# Patient Record
Sex: Female | Born: 1985 | Race: Asian | Hispanic: No | Marital: Married | Smoking: Current every day smoker
Health system: Southern US, Community
[De-identification: ages and names within clinical notes are randomized; demographics above are authoritative.]

## PROBLEM LIST (undated history)

## (undated) ENCOUNTER — Inpatient Hospital Stay (HOSPITAL_COMMUNITY): Payer: BC Managed Care – PPO

## (undated) ENCOUNTER — Inpatient Hospital Stay (HOSPITAL_COMMUNITY): Payer: Self-pay

## (undated) DIAGNOSIS — G43019 Migraine without aura, intractable, without status migrainosus: Principal | ICD-10-CM

## (undated) DIAGNOSIS — A549 Gonococcal infection, unspecified: Secondary | ICD-10-CM

## (undated) DIAGNOSIS — R87619 Unspecified abnormal cytological findings in specimens from cervix uteri: Secondary | ICD-10-CM

## (undated) DIAGNOSIS — R51 Headache: Secondary | ICD-10-CM

## (undated) DIAGNOSIS — B999 Unspecified infectious disease: Secondary | ICD-10-CM

## (undated) DIAGNOSIS — F329 Major depressive disorder, single episode, unspecified: Secondary | ICD-10-CM

## (undated) DIAGNOSIS — T7840XA Allergy, unspecified, initial encounter: Secondary | ICD-10-CM

## (undated) DIAGNOSIS — F419 Anxiety disorder, unspecified: Secondary | ICD-10-CM

## (undated) DIAGNOSIS — R87629 Unspecified abnormal cytological findings in specimens from vagina: Secondary | ICD-10-CM

## (undated) DIAGNOSIS — F32A Depression, unspecified: Secondary | ICD-10-CM

## (undated) HISTORY — DX: Allergy, unspecified, initial encounter: T78.40XA

## (undated) HISTORY — DX: Major depressive disorder, single episode, unspecified: F32.9

## (undated) HISTORY — DX: Unspecified abnormal cytological findings in specimens from cervix uteri: R87.619

## (undated) HISTORY — DX: Migraine without aura, intractable, without status migrainosus: G43.019

## (undated) HISTORY — DX: Anxiety disorder, unspecified: F41.9

## (undated) HISTORY — DX: Depression, unspecified: F32.A

## (undated) HISTORY — PX: NO PAST SURGERIES: SHX2092

---
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
## 2003-11-29 ENCOUNTER — Emergency Department (HOSPITAL_COMMUNITY): Admission: EM | Admit: 2003-11-29 | Discharge: 2003-11-29 | Payer: Self-pay | Admitting: Emergency Medicine

## 2008-02-05 ENCOUNTER — Emergency Department (HOSPITAL_COMMUNITY): Admission: EM | Admit: 2008-02-05 | Discharge: 2008-02-05 | Payer: Self-pay | Admitting: Emergency Medicine

## 2010-01-21 ENCOUNTER — Emergency Department (HOSPITAL_COMMUNITY): Admission: EM | Admit: 2010-01-21 | Discharge: 2010-01-21 | Payer: Self-pay | Admitting: Family Medicine

## 2010-11-30 ENCOUNTER — Inpatient Hospital Stay (INDEPENDENT_AMBULATORY_CARE_PROVIDER_SITE_OTHER)
Admission: RE | Admit: 2010-11-30 | Discharge: 2010-11-30 | Disposition: A | Discharge status: Home / Self Care | Payer: PRIVATE HEALTH INSURANCE | Source: Ambulatory Visit | Attending: Emergency Medicine | Admitting: Emergency Medicine

## 2010-11-30 DIAGNOSIS — J020 Streptococcal pharyngitis: Secondary | ICD-10-CM

## 2010-11-30 LAB — POCT URINALYSIS DIPSTICK
Hgb urine dipstick: NEGATIVE
Specific Gravity, Urine: 1.025 (ref 1.005–1.030)
Urobilinogen, UA: 0.2 mg/dL (ref 0.0–1.0)

## 2010-11-30 LAB — POCT INFECTIOUS MONO SCREEN: Mono Screen: NEGATIVE

## 2010-11-30 LAB — POCT RAPID STREP A (OFFICE): Streptococcus, Group A Screen (Direct): POSITIVE — AB

## 2010-11-30 LAB — POCT PREGNANCY, URINE: Preg Test, Ur: NEGATIVE

## 2010-12-21 LAB — CBC
HCT: 37.8 % (ref 36.0–46.0)
Hemoglobin: 13.1 g/dL (ref 12.0–15.0)
MCHC: 34.6 g/dL (ref 30.0–36.0)
MCV: 89.9 fL (ref 78.0–100.0)
WBC: 8.3 10*3/uL (ref 4.0–10.5)

## 2010-12-21 LAB — D-DIMER, QUANTITATIVE: D-Dimer, Quant: 0.48 ug/mL-FEU (ref 0.00–0.48)

## 2013-03-12 LAB — PREGNANCY, URINE: Preg Test, Ur: POSITIVE

## 2013-03-13 ENCOUNTER — Encounter: Payer: Self-pay | Admitting: General Practice

## 2013-03-26 ENCOUNTER — Inpatient Hospital Stay (HOSPITAL_COMMUNITY): Payer: Medicaid Other

## 2013-03-26 ENCOUNTER — Inpatient Hospital Stay (HOSPITAL_COMMUNITY)
Admission: AD | Admit: 2013-03-26 | Discharge: 2013-03-26 | Disposition: A | Discharge status: Home / Self Care | Payer: Medicaid Other | Source: Ambulatory Visit | Attending: Obstetrics & Gynecology | Admitting: Obstetrics & Gynecology

## 2013-03-26 ENCOUNTER — Encounter (HOSPITAL_COMMUNITY): Payer: Self-pay

## 2013-03-26 DIAGNOSIS — O209 Hemorrhage in early pregnancy, unspecified: Secondary | ICD-10-CM

## 2013-03-26 HISTORY — DX: Unspecified infectious disease: B99.9

## 2013-03-26 HISTORY — DX: Headache: R51

## 2013-03-26 LAB — ABO/RH: ABO/RH(D): B POS

## 2013-03-26 NOTE — MAU Note (Signed)
Bleeding just started prior to coming.  No pain. Has appt at [facility] on 07/02, called them told to come here.

## 2013-03-26 NOTE — MAU Provider Note (Signed)

## 2013-03-26 NOTE — MAU Provider Note (Signed)
History     CSN: [id number]  Arrival date and time: 03/26/13 1259   First Provider Initiated Contact with Patient 03/26/13 1429      Chief Complaint  Patient presents with  . Vaginal Bleeding   HPI [name] is a 27 y.o. G1P0 at [redacted]w[redacted]d who presents to MAU today with complaint of vaginal bleeding. The patient states LMP 12/31/12. +[facility]. The patient started bleeding this afternoon. It was pink with some red and has now turned to brown. It has been very light. She denies pain, discharge or fever. She has had some nausea without vomiting or diarrhea. She had intercourse last night. She denies previous bleeding episodes in pregnancy.   OB History   Grav Para Term Preterm Abortions TAB SAB Ect Mult Living   1               Past Medical History  Diagnosis Date  . Headache(784.0)   . Infection     UTI    Past Surgical History  Procedure Laterality Date  . No past surgeries      Family History  Problem Relation Age of Onset  . Hearing loss Neg Hx     History  Substance Use Topics  . Smoking status: Former Games developer  . Smokeless tobacco: Never Used     Comment: not every day smoker, prior to the preg  . Alcohol Use: No    Allergies: Not on File  No prescriptions prior to admission    Review of Systems  Constitutional: Negative for fever and malaise/fatigue.  Gastrointestinal: Positive for nausea. Negative for vomiting, abdominal pain and diarrhea.  Genitourinary: Negative for dysuria, urgency and frequency.       + Vaginal bleeding Neg - vaginal discharge  Neurological: Negative for dizziness, loss of consciousness and weakness.   Physical Exam   Blood pressure 124/81, pulse 97, temperature 98.8 F (37.1 C), temperature source Oral, resp. rate 16, height 5' (1.524 m), weight 118 lb 9.6 oz (53.797 kg), last menstrual period 12/31/2012, SpO2 100.00%.  Physical Exam  Constitutional: She is oriented to  person, place, and time. She appears well-developed and well-nourished. No distress.  HENT:  Head: Normocephalic and atraumatic.  Cardiovascular: Normal rate, regular rhythm and normal heart sounds.   Respiratory: Effort normal and breath sounds normal. No respiratory distress.  GI: Soft. Bowel sounds are normal. She exhibits no distension and no mass. There is no tenderness. There is no rebound and no guarding.  Neurological: She is alert and oriented to person, place, and time.  Skin: Skin is warm and dry. No erythema.  Psychiatric: She has a normal mood and affect.   Results for orders placed during the hospital encounter of 03/26/13 (from the past 24 hour(s))  WET PREP, GENITAL     Status: Abnormal   Collection Time    03/26/13  2:50 PM      Result Value Range   Yeast Wet Prep HPF POC NONE SEEN  NONE SEEN   Trich, Wet Prep NONE SEEN  NONE SEEN   Clue Cells Wet Prep HPF POC FEW (*) NONE SEEN   WBC, Wet Prep HPF POC FEW (*) NONE SEEN  ABO/RH     Status: None   Collection Time    03/26/13  5:00 PM      Result Value Range   ABO/RH(D) B POS       MAU Course  Procedures  None  MDM UA, Wet prep, GC/Chlamydia and [place] today ABO/Rh and quant hCG drawn Quant pending at time of discharge Discussed [place] results with patient. Defined poor prognostic factors noted on [place] and high possibility of SAB. Patient will return to MAU in 48 hours for repeat quant hCG to confirm. Bleeding precautions discussed.   Assessment and Plan  A: Vaginal bleeding in early pregnancy  P: Discharge home Bleeding precautions discussed Patient to return in 48 hours for repeat quant hCG  Patient may return to MAU sooner if symptoms were to change or worsen  Freddi Starr, PA-C  03/26/2013, 5:42 PM

## 2013-03-26 NOTE — MAU Note (Signed)
Patient states she had dark red blood on tissue about 1200, no pain. Patient has had a positive pregnancy test at [facility] and has an appointment with the [facility] on 7-2. No active bleeding.

## 2013-03-26 NOTE — MAU Note (Signed)
Additional labs drawn and explained.

## 2013-03-28 ENCOUNTER — Inpatient Hospital Stay (HOSPITAL_COMMUNITY)
Admission: AD | Admit: 2013-03-28 | Discharge: 2013-03-28 | Disposition: A | Discharge status: Home / Self Care | Payer: PRIVATE HEALTH INSURANCE | Source: Ambulatory Visit | Attending: Obstetrics & Gynecology | Admitting: Obstetrics & Gynecology

## 2013-03-28 DIAGNOSIS — O034 Incomplete spontaneous abortion without complication: Secondary | ICD-10-CM

## 2013-03-28 LAB — CBC
HCT: 37.9 % (ref 36.0–46.0)
MCH: 29.9 pg (ref 26.0–34.0)
MCHC: 34 g/dL (ref 30.0–36.0)
RBC: 4.32 MIL/uL (ref 3.87–5.11)
RDW: 12.8 % (ref 11.5–15.5)

## 2013-03-28 LAB — HCG, QUANTITATIVE, PREGNANCY: hCG, Beta Chain, Quant, S: 2749 m[IU]/mL — ABNORMAL HIGH (ref <5)

## 2013-03-28 MED ORDER — MISOPROSTOL 200 MCG PO TABS: 800.0000 ug | ORAL_TABLET | Freq: Four times a day (QID) | ORAL | Status: DC

## 2013-03-28 MED ORDER — IBUPROFEN 600 MG PO TABS: 600.0000 mg | ORAL_TABLET | Freq: Four times a day (QID) | ORAL | Status: DC | PRN

## 2013-03-28 MED ORDER — PROMETHAZINE HCL 25 MG PO TABS: 25.0000 mg | ORAL_TABLET | Freq: Four times a day (QID) | ORAL | Status: DC | PRN

## 2013-03-28 MED ORDER — OXYCODONE-ACETAMINOPHEN 5-325 MG PO TABS: 1.0000 | ORAL_TABLET | ORAL | Status: DC | PRN

## 2013-03-28 NOTE — MAU Provider Note (Signed)
History     CSN: [id number]  Arrival date and time: 03/28/13 1521   First Provider Initiated Contact with Patient 03/28/13 1650      Chief Complaint  Patient presents with  . Follow-up   HPI This is a 27 y.o. female at [redacted]w[redacted]d by LMP who is seen for followup HCG.  She was seen 2 days ago and US showed irregular 7 wk size sac with no fetal pole and an enlarged yolk sac. She was informed of the poor prognosis expected. She has had no further bleeding or pain.  RN Note: Bleeding has lightened up, never got heavy or passed anything. No pain.      OB History   Grav Para Term Preterm Abortions TAB SAB Ect Mult Living   1               Past Medical History  Diagnosis Date  . Headache(784.0)   . Infection     UTI    Past Surgical History  Procedure Laterality Date  . No past surgeries      Family History  Problem Relation Age of Onset  . Hearing loss Neg Hx     History  Substance Use Topics  . Smoking status: Former Games developer  . Smokeless tobacco: Never Used     Comment: not every day smoker, prior to the preg  . Alcohol Use: No    Allergies: Not on File  Prescriptions prior to admission  Medication Sig Dispense Refill  . Prenatal Vit-Fe Fumarate-FA (PRENATAL MULTIVITAMIN) TABS Take 1 tablet by mouth daily at 12 noon.        Review of Systems  Constitutional: Negative for fever, chills and malaise/fatigue.  Gastrointestinal: Negative for nausea, vomiting, abdominal pain, diarrhea and constipation.  Genitourinary: Negative for dysuria.  Musculoskeletal: Negative for myalgias.  Neurological: Negative for dizziness and weakness.   Physical Exam   Blood pressure 115/69, pulse 76, temperature 97.9 F (36.6 C), temperature source Oral, resp. rate 18, last menstrual period 12/31/2012.  Physical Exam  Constitutional: She is oriented to person, place, and time. She appears well-developed and well-nourished. No distress (but crying).  HENT:  Head: Normocephalic.   Cardiovascular: Normal rate.   Respiratory: Effort normal.  Musculoskeletal: Normal range of motion.  Neurological: She is alert and oriented to person, place, and time.  Skin: Skin is warm and dry.  Psychiatric: She has a normal mood and affect.    MAU Course  Procedures  MDM Results for orders placed during the hospital encounter of 03/28/13 (from the past 24 hour(s))  CBC     Status: None   Collection Time    03/28/13  3:30 PM      Result Value Range   WBC 7.3  4.0 - 10.5 K/uL   RBC 4.32  3.87 - 5.11 MIL/uL   Hemoglobin 12.9  12.0 - 15.0 g/dL   HCT 13.0  86.5 - 78.4 %   MCV 87.7  78.0 - 100.0 fL   MCH 29.9  26.0 - 34.0 pg   MCHC 34.0  30.0 - 36.0 g/dL   RDW 69.6  29.5 - 28.4 %   Platelets 202  150 - 400 K/uL  HCG, QUANTITATIVE, PREGNANCY     Status: Abnormal   Collection Time    03/28/13  3:33 PM      Result Value Range   hCG, Beta Chain, Quant, S 2749 (*) <5 mIU/mL   Last Quant was 4055  Assessment and Plan  A:  Incomplete miscarriage       P:  Discussed findings      Offered options, pt desires Cytotec      Early Intrauterine Pregnancy Failure Protocol  X Documented intrauterine pregnancy failure less than or equal to [redacted] weeks gestation  X No serious current illness  X Baseline Hgb greater than or equal to 10g/dl  X Patient has easily accessible transportation to the hospital  X Clear preference  X Practitioner/physician deems patient reliable  X Counseling by practitioner or physician  X Patient education by RN  X Consent form signed  Rho-Gam given by RN if indicated  _x_ Cytotec 800 mcg Intravaginally by patient at home  X Ibuprofen 600 mg 1 tablet by mouth every 6 hours as needed #30 - prescribed  X Percocet by mouth every 4 to 6 hours as needed - prescribed  _x_ Phenergan 12.5 mg by mouth every 4 hours as needed for nausea - prescribed       Precautions reviewed    [facility] 03/28/2013, 5:08 PM

## 2013-03-28 NOTE — MAU Note (Signed)
Bleeding has lightened up, never got heavy or passed anything. No pain.

## 2013-03-31 ENCOUNTER — Inpatient Hospital Stay (HOSPITAL_COMMUNITY)
Admission: AD | Admit: 2013-03-31 | Discharge: 2013-03-31 | Disposition: A | Discharge status: Home / Self Care | Payer: PRIVATE HEALTH INSURANCE | Source: Ambulatory Visit | Attending: Obstetrics and Gynecology | Admitting: Obstetrics and Gynecology

## 2013-03-31 ENCOUNTER — Encounter (HOSPITAL_COMMUNITY): Payer: Self-pay | Admitting: Advanced Practice Midwife

## 2013-03-31 ENCOUNTER — Inpatient Hospital Stay (HOSPITAL_COMMUNITY): Payer: PRIVATE HEALTH INSURANCE

## 2013-03-31 DIAGNOSIS — O039 Complete or unspecified spontaneous abortion without complication: Secondary | ICD-10-CM

## 2013-03-31 LAB — CBC
HCT: 34.8 % — ABNORMAL LOW (ref 36.0–46.0)
Hemoglobin: 12.1 g/dL (ref 12.0–15.0)
MCHC: 34.8 g/dL (ref 30.0–36.0)
RBC: 4.04 MIL/uL (ref 3.87–5.11)

## 2013-03-31 MED ORDER — KETOROLAC TROMETHAMINE 60 MG/2ML IM SOLN
60.0000 mg | Freq: Once | INTRAMUSCULAR | Status: AC
  Administered 2013-03-31: 60 mg via INTRAMUSCULAR
  Filled 2013-03-31: qty 2

## 2013-03-31 MED ORDER — HYDROMORPHONE HCL PF 1 MG/ML IJ SOLN
2.0000 mg | Freq: Once | INTRAMUSCULAR | Status: AC
  Administered 2013-03-31: 2 mg via INTRAMUSCULAR
  Filled 2013-03-31: qty 2

## 2013-03-31 NOTE — MAU Provider Note (Signed)
History     CSN: [id number]  Arrival date and time: 03/31/13 1191   First Provider Initiated Contact with Patient 03/31/13 0715      Chief Complaint  Patient presents with  . Pelvic Pain   HPI  Pt is a G1P0 presenting with incomplete miscarriage diagnosed on 03/28/13.  Pt seen in MAU initially on 03/26/13.  BHCG 4055 and ultrasound reported IUGS and enlarged yolk sac and without yolk sac on same report.  Returned on 03/28/13 BHCG 2749, with no report of pelvic bleeding.  Cytotec ordered and administered by patient.  Here tonight with report of increased pelvic pain and bleeding.    Past Medical History  Diagnosis Date  . Headache(784.0)   . Infection     UTI    Past Surgical History  Procedure Laterality Date  . No past surgeries      Family History  Problem Relation Age of Onset  . Hearing loss Neg Hx     History  Substance Use Topics  . Smoking status: Former Games developer  . Smokeless tobacco: Never Used     Comment: not every day smoker, prior to the preg  . Alcohol Use: No    Allergies: No Known Allergies  Prescriptions prior to admission  Medication Sig Dispense Refill  . ibuprofen (ADVIL,MOTRIN) 600 MG tablet Take 1 tablet (600 mg total) by mouth every 6 (six) hours as needed for pain.  30 tablet  0  . misoprostol (CYTOTEC) 200 MCG tablet Take 4 tablets (800 mcg total) by mouth 4 (four) times daily.  4 tablet  0  . oxyCODONE-acetaminophen (ROXICET) 5-325 MG per tablet Take 1 tablet by mouth every 4 (four) hours as needed for pain.  20 tablet  0  . Prenatal Vit-Fe Fumarate-FA (PRENATAL MULTIVITAMIN) TABS Take 1 tablet by mouth daily at 12 noon.      . promethazine (PHENERGAN) 25 MG tablet Take 1 tablet (25 mg total) by mouth every 6 (six) hours as needed for nausea.  20 tablet  0    Review of Systems  Gastrointestinal: Positive for abdominal pain (pelvic pain).  Genitourinary:       Vaginal bleeding  All other systems reviewed and are negative.   Physical Exam    Blood pressure 125/70, pulse 81, temperature 97.8 F (36.6 C), temperature source Oral, resp. rate 22, height 5' (1.524 m), last menstrual period 12/31/2012.  Physical Exam  Constitutional: She is oriented to person, place, and time. She appears well-developed and well-nourished.  Uncomfortable appearing  HENT:  Head: Normocephalic.  Neck: Normal range of motion. Neck supple.  Cardiovascular: Normal rate, regular rhythm and normal heart sounds.  Exam reveals no gallop and no friction rub.   No murmur heard. Respiratory: Effort normal and breath sounds normal. No respiratory distress.  GI: Soft. She exhibits no mass. There is tenderness (suprapubic). There is no rebound and no guarding.  Genitourinary: There is bleeding (moderate; +clots; vaginal vault swabbed; no products of conception or clots in cervical os) around the vagina. Foreign body:     Neurological: She is alert and oriented to person, place, and time.  Skin: Skin is warm and dry.    MAU Course  Procedures 0720 Dilaudid 2 mg IM ordered for pain.  0725 Exam completed; +bleeding, + clots; no POC seen in vaginal vault or in cervical os > consulted with Dr. Jolayne Panther > reviewed HPI/previous ultrasound > obtain repeat ultrasound to rule-out missed ectopic.   0805 Report given to N. Bascom Levels  who assumes care of patient.  Results for orders placed during the hospital encounter of 03/31/13 (from the past 24 hour(s))  CBC     Status: Abnormal   Collection Time    03/31/13  7:29 AM      Result Value Range   WBC 14.2 (*) 4.0 - 10.5 K/uL   RBC 4.04  3.87 - 5.11 MIL/uL   Hemoglobin 12.1  12.0 - 15.0 g/dL   HCT 45.4 (*) 09.8 - 11.9 %   MCV 86.1  78.0 - 100.0 fL   MCH 30.0  26.0 - 34.0 pg   MCHC 34.8  30.0 - 36.0 g/dL   RDW 14.7  82.9 - 56.2 %   Platelets 161  150 - 400 K/uL  HCG, QUANTITATIVE, PREGNANCY     Status: Abnormal   Collection Time    03/31/13  7:29 AM      Result Value Range   hCG, Beta Chain, Quant, S 1454 (*)  <5 mIU/mL   US Ob Comp Less 14 Wks  03/26/2013   OBSTETRICAL ULTRASOUND: This exam was performed within a [facility]. The OB US report was generated in the AS system, and faxed to the ordering physician.   This report is also available in [employer] and in the [employer]. See AS Obstetric US report.  US Ob Transvaginal  03/31/2013   *RADIOLOGY REPORT*  Clinical Data: Pelvic pain.  Bleeding.  Questionable enlarged yolk sac on prior exam.  Cramping.  TRANSVAGINAL OB ULTRASOUND  Technique:  Transvaginal ultrasound was performed for evaluation of the gestation as well as the maternal uterus and adnexal regions.  Comparison: 03/26/2013  Findings: Irregular 3.7 x 1.4 x 2.5 cm fluid echogenicity collection in the cervix and lower uterine segment suggesting a gestational sac. Part of this appears to bulge from the external os.  No yolk sac is present.  No embryo observed.  More distally the endometrium measures 2.9 cm in thickness.  There is only mild heterogeneity in the endometrium.  Maternal ovaries not visualized.  No free pelvic fluid.  IMPRESSION:  1.  Oblong and mildly irregular 3.7 cm fluid echogenicity structure in the cervix and lower uterine segment, with a margin bulging along the external loss, suggesting a gestational sac being expelled.  No yolk sac or embryo observed. 2.  Thickened distal endometrium to 0.9 cm.  This may warrant observation and / or following quantitative beta HCG trend to ensure resolution. 3.  On the prior exam, there was a membrane in the gestational sac with uncertainty as to whether this membrane may have represented the margin of a very enlarged irregular yolk sac or whether it is simply incidental.  This may have led to the ambiguity as to whether a yolk sac was previously present.  No yolk sac is currently seen.   Original Report Authenticated By: Gaylyn Rong, M.D.   US Ob Transvaginal  03/26/2013   OBSTETRICAL ULTRASOUND:  This exam was performed within a [facility]. The OB US report was generated in the AS system, and faxed to the ordering physician.   This report is also available in [employer] and in the [employer]. See AS Obstetric US report.  Pt continued to c/o of severe cramping after return from u/s. Toradol 60 mg IM given. Speculum exam repeated, POC protruding from cervical os, cramping much better once removed with ring forceps, small bleeding.  Assessment and Plan   1. Complete abortion  Precautions rev'd, f/u in clinic in 2 weeks.     Medication List    STOP taking these medications       misoprostol 200 MCG tablet  Commonly known as:  CYTOTEC      TAKE these medications       ibuprofen 600 MG tablet  Commonly known as:  ADVIL,MOTRIN  Take 1 tablet (600 mg total) by mouth every 6 (six) hours as needed for pain.     oxyCODONE-acetaminophen 5-325 MG per tablet  Commonly known as:  ROXICET  Take 1 tablet by mouth every 4 (four) hours as needed for pain.     prenatal multivitamin Tabs  Take 1 tablet by mouth daily at 12 noon.     promethazine 25 MG tablet  Commonly known as:  PHENERGAN  Take 1 tablet (25 mg total) by mouth every 6 (six) hours as needed for nausea.            Follow-up Information   Follow up with [facility] In 2 weeks. (someone will call to schedule)    Contact information:   [address] [place] [address] [phone]

## 2013-03-31 NOTE — MAU Note (Signed)
PT  SAYS SHE WAS HERE LAST Thursday-  DX- INCOMPLETE SAB-    CHOSE TO JUST WAIT-- THEN YESTERDAY AT 4PM - STARTED  BLEEDING THEN LATER STARTED CRAMPING.  ON ARRIVAL TO MAU- BROUGHT  TO RM 7 VIA W/C- PT CRYING  WITH CRAMPS.-  PAD ON - MOD AMT RED BLEEDING.

## 2013-04-01 NOTE — MAU Provider Note (Signed)
Attestation of Attending Supervision of Advanced Practitioner (CNM/NP): Evaluation and management procedures were performed by the Advanced Practitioner under my supervision and collaboration.  I have reviewed the Advanced Practitioner's note and chart, and I agree with the management and plan.  Truda Staub 04/01/2013 9:03 AM

## 2013-04-03 ENCOUNTER — Encounter: Payer: PRIVATE HEALTH INSURANCE | Admitting: Advanced Practice Midwife

## 2013-04-03 ENCOUNTER — Other Ambulatory Visit: Payer: PRIVATE HEALTH INSURANCE

## 2013-04-19 ENCOUNTER — Encounter: Payer: PRIVATE HEALTH INSURANCE | Admitting: Advanced Practice Midwife

## 2013-04-26 ENCOUNTER — Encounter: Payer: Self-pay | Admitting: *Deleted

## 2013-07-17 ENCOUNTER — Encounter (HOSPITAL_COMMUNITY): Payer: Self-pay | Admitting: *Deleted

## 2013-07-17 ENCOUNTER — Inpatient Hospital Stay (HOSPITAL_COMMUNITY): Payer: Medicaid Other

## 2013-07-17 ENCOUNTER — Inpatient Hospital Stay (HOSPITAL_COMMUNITY)
Admission: AD | Admit: 2013-07-17 | Discharge: 2013-07-17 | Disposition: A | Discharge status: Home / Self Care | Payer: Medicaid Other | Source: Ambulatory Visit | Attending: Obstetrics & Gynecology | Admitting: Obstetrics & Gynecology

## 2013-07-17 DIAGNOSIS — O209 Hemorrhage in early pregnancy, unspecified: Secondary | ICD-10-CM | POA: Insufficient documentation

## 2013-07-17 LAB — URINALYSIS, ROUTINE W REFLEX MICROSCOPIC
Glucose, UA: NEGATIVE mg/dL
Ketones, ur: NEGATIVE mg/dL
Leukocytes, UA: NEGATIVE
Specific Gravity, Urine: >1.03 — ABNORMAL HIGH (ref 1.005–1.030)
pH: 6 (ref 5.0–8.0)

## 2013-07-17 LAB — CBC WITH DIFFERENTIAL/PLATELET
Basophils Absolute: 0.1 10*3/uL (ref 0.0–0.1)
Basophils Relative: 1 % (ref 0–1)
Eosinophils Absolute: 0.2 10*3/uL (ref 0.0–0.7)
Eosinophils Relative: 3 % (ref 0–5)
MCH: 28.9 pg (ref 26.0–34.0)
MCHC: 33.5 g/dL (ref 30.0–36.0)
MCV: 86.4 fL (ref 78.0–100.0)
Neutrophils Relative %: 40 % — ABNORMAL LOW (ref 43–77)
Platelets: 180 10*3/uL (ref 150–400)
RBC: 4.25 MIL/uL (ref 3.87–5.11)
RDW: 12.6 % (ref 11.5–15.5)

## 2013-07-17 LAB — URINE MICROSCOPIC-ADD ON

## 2013-07-17 LAB — HCG, QUANTITATIVE, PREGNANCY: hCG, Beta Chain, Quant, S: 239 m[IU]/mL — ABNORMAL HIGH (ref <5)

## 2013-07-17 LAB — WET PREP, GENITAL: Yeast Wet Prep HPF POC: NONE SEEN

## 2013-07-17 LAB — POCT PREGNANCY, URINE: Preg Test, Ur: POSITIVE — AB

## 2013-07-17 NOTE — MAU Provider Note (Signed)
History     CSN: [id number]  Arrival date and time: 07/17/13 5621  First Provider Initiated Contact with Patient 07/17/13 2020     Chief Complaint  Patient presents with  . Possible Pregnancy  . Vaginal Bleeding   HPI [name] [name] is 27 y.o. G2P0010 Unknown weeks presenting with vaginal bleeding. Hx of miscarriage 3 months ago.  Since she reports 3 normal menstrual cycles until the last one on 07/05/13.  She bled the usual 4-5 days then has had spotting on and off.  Home Pregnancy test today was positive.  She is concerned about possible miscarriage.  She has 1 sexual partner-husband.  She does not have an OBGYN.   Past Medical History  Diagnosis Date  . Headache(784.0)   . Infection     UTI    Past Surgical History  Procedure Laterality Date  . No past surgeries      Family History  Problem Relation Age of Onset  . Hearing loss Neg Hx     History  Substance Use Topics  . Smoking status: Former Games developer  . Smokeless tobacco: Never Used     Comment: not every day smoker, prior to the preg  . Alcohol Use: No    Allergies: No Known Allergies  Prescriptions prior to admission  Medication Sig Dispense Refill  . ibuprofen (ADVIL,MOTRIN) 600 MG tablet Take 1 tablet (600 mg total) by mouth every 6 (six) hours as needed for pain.  30 tablet  0  . oxyCODONE-acetaminophen (ROXICET) 5-325 MG per tablet Take 1 tablet by mouth every 4 (four) hours as needed for pain.  20 tablet  0  . Prenatal Vit-Fe Fumarate-FA (PRENATAL MULTIVITAMIN) TABS Take 1 tablet by mouth daily at 12 noon.      . promethazine (PHENERGAN) 25 MG tablet Take 1 tablet (25 mg total) by mouth every 6 (six) hours as needed for nausea.  20 tablet  0    Review of Systems  Constitutional: Negative for fever and chills.  Gastrointestinal: Negative for nausea, vomiting and abdominal pain.  Genitourinary: Negative for dysuria and urgency.       + for vaginal bleeding  Neurological: Negative for headaches.   Physical  Exam   Blood pressure 116/74, pulse 83, temperature 99.1 F (37.3 C), temperature source Oral, resp. rate 18, height 5' (1.524 m), weight 116 lb (52.617 kg), last menstrual period 07/05/2013, SpO2 99.00%.  Physical Exam  Constitutional: She is oriented to person, place, and time. She appears well-developed and well-nourished. No distress.  HENT:  Head: Normocephalic.  Neck: Normal range of motion.  Cardiovascular: Normal rate.   Respiratory: Effort normal.  GI: Soft. She exhibits no distension and no mass. There is no tenderness. There is no rebound and no guarding.  Genitourinary: There is no rash, tenderness or lesion on the right labia. There is no rash, tenderness or lesion on the left labia. No erythema or tenderness around the vagina. Bleeding: small amount of bright red blood without clot. No vaginal discharge found.  Neurological: She is alert and oriented to person, place, and time.  Skin: Skin is warm and dry.  Psychiatric: She has a normal mood and affect. Her behavior is normal.   BLOOD TYPE FROM PREVIOUS RECORD IS B POSITIVE  Results for orders placed during the hospital encounter of 07/17/13 (from the past 24 hour(s))  URINALYSIS, ROUTINE W REFLEX MICROSCOPIC     Status: Abnormal   Collection Time    07/17/13  7:14 PM  Result Value Range   Color, Urine YELLOW  YELLOW   APPearance CLEAR  CLEAR   Specific Gravity, Urine >1.030 (*) 1.005 - 1.030   pH 6.0  5.0 - 8.0   Glucose, UA NEGATIVE  NEGATIVE mg/dL   Hgb urine dipstick LARGE (*) NEGATIVE   Bilirubin Urine NEGATIVE  NEGATIVE   Ketones, ur NEGATIVE  NEGATIVE mg/dL   Protein, ur NEGATIVE  NEGATIVE mg/dL   Urobilinogen, UA 0.2  0.0 - 1.0 mg/dL   Nitrite NEGATIVE  NEGATIVE   Leukocytes, UA NEGATIVE  NEGATIVE  URINE MICROSCOPIC-ADD ON     Status: Abnormal   Collection Time    07/17/13  7:14 PM      Result Value Range   Squamous Epithelial / LPF FEW (*) RARE   WBC, UA 0-2  <3 WBC/hpf   RBC / HPF 3-6  <3 RBC/hpf    Bacteria, UA FEW (*) RARE  POCT PREGNANCY, URINE     Status: Abnormal   Collection Time    07/17/13  7:49 PM      Result Value Range   Preg Test, Ur POSITIVE (*) NEGATIVE  WET PREP, GENITAL     Status: Abnormal   Collection Time    07/17/13  8:10 PM      Result Value Range   Yeast Wet Prep HPF POC NONE SEEN  NONE SEEN   Trich, Wet Prep NONE SEEN  NONE SEEN   Clue Cells Wet Prep HPF POC FEW (*) NONE SEEN   WBC, Wet Prep HPF POC FEW (*) NONE SEEN  CBC WITH DIFFERENTIAL     Status: Abnormal   Collection Time    07/17/13  8:24 PM      Result Value Range   WBC 5.3  4.0 - 10.5 K/uL   RBC 4.25  3.87 - 5.11 MIL/uL   Hemoglobin 12.3  12.0 - 15.0 g/dL   HCT 82.9  56.2 - 13.0 %   MCV 86.4  78.0 - 100.0 fL   MCH 28.9  26.0 - 34.0 pg   MCHC 33.5  30.0 - 36.0 g/dL   RDW 86.5  78.4 - 69.6 %   Platelets 180  150 - 400 K/uL   Neutrophils Relative % 40 (*) 43 - 77 %   Neutro Abs 2.1  1.7 - 7.7 K/uL   Lymphocytes Relative 46  12 - 46 %   Lymphs Abs 2.4  0.7 - 4.0 K/uL   Monocytes Relative 10  3 - 12 %   Monocytes Absolute 0.5  0.1 - 1.0 K/uL   Eosinophils Relative 3  0 - 5 %   Eosinophils Absolute 0.2  0.0 - 0.7 K/uL   Basophils Relative 1  0 - 1 %   Basophils Absolute 0.1  0.0 - 0.1 K/uL  HCG, QUANTITATIVE, PREGNANCY     Status: Abnormal   Collection Time    07/17/13  8:24 PM      Result Value Range   hCG, Beta Chain, Quant, S 239 (*) <5 mIU/mL   MAU Course  Procedures GC/CHL culture to lab  MDM Care turned over to V. Katrinka Blazing, CNM at 21:00.    Assessment and Plan    KEY,EVE M 07/17/2013, 8:18 PM   Care of patient assumed by [name], CNM at 2100.  US Ob Comp Less 14 Wks  07/17/2013   CLINICAL DATA:  Pregnant, bleeding ; quantitative beta HCG = 239  EXAM: OBSTETRIC <14 WK [place] AND TRANSVAGINAL OB US  TECHNIQUE: Both transabdominal and transvaginal ultrasound examinations were performed for complete evaluation of the gestation as well as the maternal uterus, adnexal  regions, and pelvic cul-de-sac. Transvaginal technique was performed to assess early pregnancy.  COMPARISON:  03/31/2013  FINDINGS: Intrauterine gestational sac: Not identified  Yolk sac:  N/A  Embryo:  N/A  Cardiac Activity: N/A  Heart Rate:  N/A bpm  Maternal uterus/adnexae: No intrauterine gestational sac or fluid is definitely visualized.  No adnexal masses identified.  Small amount of nonspecific free pelvic fluid.  Right ovary normal size and morphology, 2.9 x 2.9 x 1.7 cm.  Left ovary normal size and morphology, 1.7 x 2.7 x 1.2 cm.  No other pelvic sonographic abnormalities identified.  IMPRESSION: No intrauterine gestational identified.  Small amount of nonspecific free pelvic fluid with no evidence of adnexal mass.  With a quantitative beta HCG of 239, differential diagnosis includes early intrauterine gestation too early to visualize, spontaneous abortion, and ectopic pregnancy.  Serial followup quantitative beta HCG and or ultrasound recommended to definitively exclude ectopic pregnancy.   Electronically Signed   By: Ulyses Southward M.D.   On: 07/17/2013 21:18   US Ob Transvaginal  07/17/2013   CLINICAL DATA:  Pregnant, bleeding ; quantitative beta HCG = 239  EXAM: OBSTETRIC <14 WK [place] AND TRANSVAGINAL OB US  TECHNIQUE: Both transabdominal and transvaginal ultrasound examinations were performed for complete evaluation of the gestation as well as the maternal uterus, adnexal regions, and pelvic cul-de-sac. Transvaginal technique was performed to assess early pregnancy.  COMPARISON:  03/31/2013  FINDINGS: Intrauterine gestational sac: Not identified  Yolk sac:  N/A  Embryo:  N/A  Cardiac Activity: N/A  Heart Rate:  N/A bpm  Maternal uterus/adnexae: No intrauterine gestational sac or fluid is definitely visualized.  No adnexal masses identified.  Small amount of nonspecific free pelvic fluid.  Right ovary normal size and morphology, 2.9 x 2.9 x 1.7 cm.  Left ovary normal size and morphology, 1.7 x 2.7 x 1.2  cm.  No other pelvic sonographic abnormalities identified.  IMPRESSION: No intrauterine gestational identified.  Small amount of nonspecific free pelvic fluid with no evidence of adnexal mass.  With a quantitative beta HCG of 239, differential diagnosis includes early intrauterine gestation too early to visualize, spontaneous abortion, and ectopic pregnancy.  Serial followup quantitative beta HCG and or ultrasound recommended to definitively exclude ectopic pregnancy.   Electronically Signed   By: Ulyses Southward M.D.   On: 07/17/2013 21:18   ASSESSMENT: 1. First trimester bleeding    PLAN: Discharge home in stable condition. SAB and ectopic precautions. Pelvic rest x1 week. Follow-up Information   Follow up with [facility] In 2 days. (for repeat blood work or sooner as needed if symptoms worsen)    Contact information:   [address] [id number] [place] [address] [phone]       Medication List    Notice   You have not been prescribed any medications.      [place], CNM 07/18/2013 7:55 AM

## 2013-07-17 NOTE — MAU Note (Signed)
Pt reports she started her period on 07/05/2013, states she has had bleeding off/on since. Took a pregnancy test today at home and it was positive. No pain

## 2013-07-20 ENCOUNTER — Inpatient Hospital Stay (HOSPITAL_COMMUNITY)
Admission: AD | Admit: 2013-07-20 | Discharge: 2013-07-20 | Disposition: A | Discharge status: Home / Self Care | Payer: No Typology Code available for payment source | Source: Ambulatory Visit | Attending: Family Medicine | Admitting: Family Medicine

## 2013-07-20 DIAGNOSIS — O039 Complete or unspecified spontaneous abortion without complication: Secondary | ICD-10-CM | POA: Insufficient documentation

## 2013-07-20 DIAGNOSIS — Z8759 Personal history of other complications of pregnancy, childbirth and the puerperium: Secondary | ICD-10-CM

## 2013-07-20 LAB — HCG, QUANTITATIVE, PREGNANCY: hCG, Beta Chain, Quant, S: 76 m[IU]/mL — ABNORMAL HIGH (ref <5)

## 2013-07-20 NOTE — MAU Provider Note (Signed)
Chart reviewed and agree with management and plan.  

## 2013-07-20 NOTE — MAU Note (Signed)
Pt presents for repeat quant. 

## 2013-07-20 NOTE — MAU Provider Note (Signed)
S: 27 y.o. G2P0010 @ Unknown by LMP presents to MAU for follow up quant hcg.  She presented to MAU 2 days ago for vaginal spotting with positive home pregnancy test and unsure LMP.  She had quant hcg on 10/16 of 239.  U/S on that date showed no IUP or visible ectopic. Her blood type is B positive. Pt reports she bled like a heavy period on 10/16 and 10/17 but her bleeding is significantly less today.  She had cramping along with the bleeding, but this is also improved.  She denies vaginal itching/burning, urinary symptoms, h/a, dizziness, n/v, or fever/chills.    O: BP 113/74  Pulse 92  Temp(Src) 98.1 F (36.7 C) (Oral)  Resp 18  LMP 07/05/2013 Results for orders placed during the hospital encounter of 07/20/13 (from the past 24 hour(s))  HCG, QUANTITATIVE, PREGNANCY     Status: Abnormal   Collection Time    07/20/13  1:04 PM      Result Value Range   hCG, Beta Chain, Quant, S 76 (*) <5 mIU/mL   A:  1. Miscarriage    P: D/C home F/U in clinic in 1 week (message sent for appointment) F/U in clinic with provider r/t frequent miscarriage Return to MAU as needed  Sharen Counter Certified Nurse-Midwife

## 2013-07-26 ENCOUNTER — Other Ambulatory Visit: Payer: PRIVATE HEALTH INSURANCE

## 2013-09-02 ENCOUNTER — Emergency Department (HOSPITAL_COMMUNITY)
Admission: EM | Admit: 2013-09-02 | Discharge: 2013-09-02 | Disposition: A | Discharge status: Home / Self Care | Payer: No Typology Code available for payment source | Attending: Emergency Medicine | Admitting: Emergency Medicine

## 2013-09-02 ENCOUNTER — Encounter (HOSPITAL_COMMUNITY): Payer: Self-pay | Admitting: Emergency Medicine

## 2013-09-02 DIAGNOSIS — Z87440 Personal history of urinary (tract) infections: Secondary | ICD-10-CM | POA: Insufficient documentation

## 2013-09-02 DIAGNOSIS — B9789 Other viral agents as the cause of diseases classified elsewhere: Secondary | ICD-10-CM | POA: Insufficient documentation

## 2013-09-02 DIAGNOSIS — B349 Viral infection, unspecified: Secondary | ICD-10-CM

## 2013-09-02 DIAGNOSIS — R61 Generalized hyperhidrosis: Secondary | ICD-10-CM | POA: Insufficient documentation

## 2013-09-02 DIAGNOSIS — Z87891 Personal history of nicotine dependence: Secondary | ICD-10-CM | POA: Insufficient documentation

## 2013-09-02 DIAGNOSIS — Z3202 Encounter for pregnancy test, result negative: Secondary | ICD-10-CM | POA: Insufficient documentation

## 2013-09-02 DIAGNOSIS — N390 Urinary tract infection, site not specified: Secondary | ICD-10-CM | POA: Insufficient documentation

## 2013-09-02 LAB — URINALYSIS, ROUTINE W REFLEX MICROSCOPIC
Glucose, UA: NEGATIVE mg/dL
Leukocytes, UA: NEGATIVE
Specific Gravity, Urine: 1.03 (ref 1.005–1.030)
Urobilinogen, UA: 0.2 mg/dL (ref 0.0–1.0)
pH: 6 (ref 5.0–8.0)

## 2013-09-02 LAB — PREGNANCY, URINE: Preg Test, Ur: NEGATIVE

## 2013-09-02 LAB — URINE MICROSCOPIC-ADD ON

## 2013-09-02 MED ORDER — SODIUM CHLORIDE 0.9 % IV BOLUS (SEPSIS)
1000.0000 mL | Freq: Once | INTRAVENOUS | Status: AC
  Administered 2013-09-02: 1000 mL via INTRAVENOUS

## 2013-09-02 MED ORDER — ONDANSETRON HCL 4 MG/2ML IJ SOLN
4.0000 mg | Freq: Once | INTRAMUSCULAR | Status: AC
  Administered 2013-09-02: 4 mg via INTRAVENOUS
  Filled 2013-09-02: qty 2

## 2013-09-02 MED ORDER — NITROFURANTOIN MONOHYD MACRO 100 MG PO CAPS: 100.0000 mg | ORAL_CAPSULE | Freq: Two times a day (BID) | ORAL | Status: DC

## 2013-09-02 MED ORDER — ACETAMINOPHEN 500 MG PO TABS
1000.0000 mg | ORAL_TABLET | Freq: Once | ORAL | Status: AC
  Administered 2013-09-02: 1000 mg via ORAL
  Filled 2013-09-02: qty 2

## 2013-09-02 NOTE — ED Notes (Signed)
Patient reports that she has had body aches, fever, and diarrhea since yesterday AM.

## 2013-09-02 NOTE — ED Provider Notes (Signed)
CSN: [id number]     Arrival date & time 09/02/13  1113 History   First MD Initiated Contact with Patient 09/02/13 1158     Chief Complaint  Patient presents with  . Fever  . Generalized Body Aches   (Consider location/radiation/quality/duration/timing/severity/associated sxs/prior Treatment) HPI.... achiness for 24 hours with associated sweats, chills, diarrhea.  No one else has been sick. No dysuria, cough, stiff neck. Severity is mild to moderate. She is normally healthy. She took NyQuil with minimal relief.  Past Medical History  Diagnosis Date  . Headache(784.0)   . Infection     UTI   Past Surgical History  Procedure Laterality Date  . No past surgeries     Family History  Problem Relation Age of Onset  . Hearing loss Neg Hx    History  Substance Use Topics  . Smoking status: Former Games developer  . Smokeless tobacco: Never Used     Comment: not every day smoker, prior to the preg  . Alcohol Use: No   OB History   Grav Para Term Preterm Abortions TAB SAB Ect Mult Living   2    1  1    0     Review of Systems  All other systems reviewed and are negative.    Allergies  Review of patient's allergies indicates no known allergies.  Home Medications   Current Outpatient Rx  Name  Route  Sig  Dispense  Refill  . nitrofurantoin, macrocrystal-monohydrate, (MACROBID) 100 MG capsule   Oral   Take 1 capsule (100 mg total) by mouth 2 (two) times daily. X 7 days   10 capsule   0    BP 105/62  Pulse 80  Temp(Src) 97.8 F (36.6 C) (Oral)  Resp 20  SpO2 99%  LMP 07/05/2013  Breastfeeding? Unknown Physical Exam  Nursing note and vitals reviewed. Constitutional: She is oriented to person, place, and time. She appears well-developed and well-nourished.  HENT:  Head: Normocephalic and atraumatic.  Eyes: Conjunctivae and EOM are normal. Pupils are equal, round, and reactive to light.  Neck: Normal range of motion. Neck supple.  Cardiovascular: Normal rate, regular rhythm  and normal heart sounds.   Pulmonary/Chest: Effort normal and breath sounds normal.  Abdominal: Soft. Bowel sounds are normal.  Musculoskeletal: Normal range of motion.  Neurological: She is alert and oriented to person, place, and time.  Skin: Skin is warm and dry.  Psychiatric: She has a normal mood and affect. Her behavior is normal.    ED Course  Procedures (including critical care time) Labs Review Labs Reviewed  URINALYSIS, ROUTINE W REFLEX MICROSCOPIC - Abnormal; Notable for the following:    APPearance CLOUDY (*)    Ketones, ur >80 (*)    Protein, ur 30 (*)    Nitrite POSITIVE (*)    All other components within normal limits  URINE MICROSCOPIC-ADD ON - Abnormal; Notable for the following:    Bacteria, UA MANY (*)    All other components within normal limits  URINE CULTURE  PREGNANCY, URINE   Imaging Review No results found.  EKG Interpretation   None       MDM   1. Viral syndrome   2. Urinary tract infection    Skin physical suggestive of viral syndrome. She does have evidence of a minor urinary infection. Rx Macrobid for 5 days. Increase fluids. Tylenol for fever    Donnetta Hutching, MD 09/02/13 1530

## 2013-09-02 NOTE — Progress Notes (Signed)
   CARE MANAGEMENT ED NOTE 09/02/2013  Patient:  [name] [name]   Account Number:  [id number]  Date Initiated:  09/02/2013  Documentation initiated by:  Edd Arbour  Subjective/Objective Assessment:   27 yr old female medicaid of [place] pt states she does not have a pcp and [place] in [place]     Subjective/Objective Assessment Detail:     Action/Plan:   CM spoke with pt and offered her a list of medicaid [place] providers   Action/Plan Detail:   Anticipated DC Date:       Status Recommendation to Physician:   Result of Recommendation:    Other ED Services  Consult Working Plan    DC Planning Services  Other  PCP issues  Outpatient Services - Pt will follow up    Choice offered to / List presented to:            Status of service:  Completed, signed off  ED Comments:   ED Comments Detail:

## 2013-09-04 LAB — URINE CULTURE

## 2013-10-10 ENCOUNTER — Ambulatory Visit: Payer: Medicaid Other | Admitting: *Deleted

## 2013-10-10 DIAGNOSIS — O209 Hemorrhage in early pregnancy, unspecified: Secondary | ICD-10-CM

## 2013-10-10 DIAGNOSIS — O09291 Supervision of pregnancy with other poor reproductive or obstetric history, first trimester: Secondary | ICD-10-CM

## 2013-10-10 LAB — POCT URINALYSIS DIP (DEVICE)
Bilirubin Urine: NEGATIVE
Glucose, UA: NEGATIVE mg/dL
Hgb urine dipstick: NEGATIVE
Ketones, ur: NEGATIVE mg/dL
Leukocytes, UA: NEGATIVE
NITRITE: NEGATIVE
PH: 5.5 (ref 5.0–8.0)
Protein, ur: NEGATIVE mg/dL
Specific Gravity, Urine: >=1.03 (ref 1.005–1.030)
UROBILINOGEN UA: 0.2 mg/dL (ref 0.0–1.0)

## 2013-10-10 LAB — POCT PREGNANCY, URINE: Preg Test, Ur: POSITIVE — AB

## 2013-10-11 LAB — OBSTETRIC PANEL
Antibody Screen: NEGATIVE
Basophils Absolute: 0 K/uL (ref 0.0–0.1)
Basophils Relative: 1 % (ref 0–1)
Eosinophils Absolute: 0.2 K/uL (ref 0.0–0.7)
Eosinophils Relative: 2 % (ref 0–5)
HCT: 40.1 % (ref 36.0–46.0)
Hemoglobin: 13.5 g/dL (ref 12.0–15.0)
Hepatitis B Surface Ag: NEGATIVE
Lymphocytes Relative: 30 % (ref 12–46)
Lymphs Abs: 2.1 K/uL (ref 0.7–4.0)
MCH: 29.7 pg (ref 26.0–34.0)
MCHC: 33.7 g/dL (ref 30.0–36.0)
MCV: 88.3 fL (ref 78.0–100.0)
Monocytes Absolute: 0.4 K/uL (ref 0.1–1.0)
Monocytes Relative: 6 % (ref 3–12)
Neutro Abs: 4.2 K/uL (ref 1.7–7.7)
Neutrophils Relative %: 61 % (ref 43–77)
Platelets: 212 K/uL (ref 150–400)
RBC: 4.54 MIL/uL (ref 3.87–5.11)
RDW: 13.9 % (ref 11.5–15.5)
Rh Type: POSITIVE
Rubella: 1.15 {index} — ABNORMAL HIGH (ref <0.90)
WBC: 7 K/uL (ref 4.0–10.5)

## 2013-10-11 LAB — HIV ANTIBODY (ROUTINE TESTING W REFLEX): HIV: NONREACTIVE

## 2013-10-14 LAB — HEMOGLOBINOPATHY EVALUATION
Hemoglobin Other: 0 %
Hgb A2 Quant: 2.1 % — ABNORMAL LOW (ref 2.2–3.2)
Hgb A: 97.9 % — ABNORMAL HIGH (ref 96.8–97.8)
Hgb F Quant: 0 % (ref 0.0–2.0)
Hgb S Quant: 0 %

## 2013-11-05 ENCOUNTER — Ambulatory Visit (INDEPENDENT_AMBULATORY_CARE_PROVIDER_SITE_OTHER): Payer: No Typology Code available for payment source | Admitting: Family Medicine

## 2013-11-05 ENCOUNTER — Other Ambulatory Visit: Payer: Self-pay | Admitting: Family Medicine

## 2013-11-05 ENCOUNTER — Encounter: Payer: Self-pay | Admitting: Family Medicine

## 2013-11-05 VITALS — BP 111/70 | Temp 98.1°F | Wt 125.4 lb

## 2013-11-05 DIAGNOSIS — Z3480 Encounter for supervision of other normal pregnancy, unspecified trimester: Secondary | ICD-10-CM | POA: Insufficient documentation

## 2013-11-05 DIAGNOSIS — Z8742 Personal history of other diseases of the female genital tract: Secondary | ICD-10-CM

## 2013-11-05 DIAGNOSIS — Z8759 Personal history of other complications of pregnancy, childbirth and the puerperium: Secondary | ICD-10-CM

## 2013-11-05 DIAGNOSIS — Z3682 Encounter for antenatal screening for nuchal translucency: Secondary | ICD-10-CM

## 2013-11-05 LAB — POCT URINALYSIS DIP (DEVICE)
BILIRUBIN URINE: NEGATIVE
Glucose, UA: NEGATIVE mg/dL
HGB URINE DIPSTICK: NEGATIVE
Ketones, ur: NEGATIVE mg/dL
LEUKOCYTES UA: NEGATIVE
Nitrite: NEGATIVE
PROTEIN: NEGATIVE mg/dL
SPECIFIC GRAVITY, URINE: 1.025 (ref 1.005–1.030)
Urobilinogen, UA: 0.2 mg/dL (ref 0.0–1.0)
pH: 6.5 (ref 5.0–8.0)

## 2013-11-05 NOTE — Progress Notes (Signed)
P= 100 Discussed appropriate weight gain based on BMI (25-35lb); pt. Verbalizes understanding.  New OB packet given.  Flu vaccine consented.

## 2013-11-05 NOTE — Progress Notes (Signed)
  Subjective:    [name] [name] is a G3P0020 5613w5d being seen today for her first obstetrical visit.  She has recently had two miscarriages. Patient does intend to breast feed. Pregnancy history fully reviewed.  Patient reports nausea. Occasional vomiting. No vb, abd pain.    Filed Vitals:   11/05/13 1409  BP: 111/70  Temp: 98.1 F (36.7 C)  Weight: 125 lb 6.4 oz (56.881 kg)    HISTORY: OB History  Gravida Para Term Preterm AB SAB TAB Ectopic Multiple Living  3    2 2     0    # Outcome Date GA Lbr Len/2nd Weight Sex Delivery Anes PTL Lv  3 CUR           2 SAB 07/2013             Comments: System Generated. Please review and update pregnancy details.  1 SAB 03/2013             Past Medical History  Diagnosis Date  . Headache(784.0)   . Infection     UTI   Past Surgical History  Procedure Laterality Date  . No past surgeries     Family History  Problem Relation Age of Onset  . Hearing loss Neg Hx      Exam    Uterus:   approx 8 week size  Pelvic Exam:    Perineum: No Hemorrhoids   Vulva: normal   Vagina:  normal mucosa, normal discharge   Cervix: nulliparous appearance   Adnexa: normal adnexa and no mass, fullness, tenderness  System: Breast:  deferred   Skin: normal coloration and turgor, no rashes    Neurologic: oriented, normal   Extremities: normal strength, tone, and muscle mass   HEENT extra ocular movement intact, sclera clear, anicteric and oropharynx clear, no lesions   Mouth/Teeth mucous membranes moist, pharynx normal without lesions   Neck supple   Cardiovascular: regular rate and rhythm   Respiratory:  chest clear, no wheezing, crepitations, rhonchi, normal symmetric air entry   Abdomen: soft, non-tender; bowel sounds normal; no masses,  no organomegaly   Urinary: urethral meatus normal      Assessment:    Pregnancy: [id number] Patient Active Problem List   Diagnosis Date Noted  . History of miscarriage 07/20/2013        Plan:      Initial labs reviewed. PAP done today FHT on doppler obtained today Flu shot today Prenatal vitamins. Problem list reviewed and updated. Genetic Screening discussed First Screen: ordered.  Ultrasound discussed; fetal survey: too early.  Follow up in 4 weeks. 50% of 30 min visit spent on counseling and coordination of care.     Kiyon Fidalgo L 11/05/2013

## 2013-11-05 NOTE — Patient Instructions (Signed)
Pregnancy - First Trimester  During sexual intercourse, millions of sperm go into the vagina. Only 1 sperm will penetrate and fertilize the female egg while it is in the Fallopian tube. One week later, the fertilized egg implants into the wall of the uterus. An embryo begins to develop into a baby. At 6 to 8 weeks, the eyes and face are formed and the heartbeat can be seen on ultrasound. At the end of 12 weeks (first trimester), all the baby's organs are formed. Now that you are pregnant, you will want to do everything you can to have a healthy baby. Two of the most important things are to get good prenatal care and follow your caregiver's instructions. Prenatal care is all the medical care you receive before the baby's birth. It is given to prevent, find, and treat problems during the pregnancy and childbirth.  PRENATAL EXAMS  · During prenatal visits, your weight, blood pressure, and urine are checked. This is done to make sure you are healthy and progressing normally during the pregnancy.  · A pregnant woman should gain 25 to 35 pounds during the pregnancy. However, if you are overweight or underweight, your caregiver will advise you regarding your weight.  · Your caregiver will ask and answer questions for you.  · Blood work, cervical cultures, other necessary tests, and a Pap test are done during your prenatal exams. These tests are done to check on your health and the probable health of your baby. Tests are strongly recommended and done for HIV with your permission. This is the virus that causes AIDS. These tests are done because medicines can be given to help prevent your baby from being born with this infection should you have been infected without knowing it. Blood work is also used to find out your blood type, previous infections, and follow your blood levels (hemoglobin).  · Low hemoglobin (anemia) is common during pregnancy. Iron and vitamins are given to help prevent this. Later in the pregnancy, blood  tests for diabetes will be done along with any other tests if any problems develop.  · You may need other tests to make sure you and the baby are doing well.  CHANGES DURING THE FIRST TRIMESTER   Your body goes through many changes during pregnancy. They vary from person to person. Talk to your caregiver about changes you notice and are concerned about. Changes can include:  · Your menstrual period stops.  · The egg and sperm carry the genes that determine what you look like. Genes from you and your partner are forming a baby. The female genes determine whether the baby is a boy or a girl.  · Your body increases in girth and you may feel bloated.  · Feeling sick to your stomach (nauseous) and throwing up (vomiting). If the vomiting is uncontrollable, call your caregiver.  · Your breasts will begin to enlarge and become tender.  · Your nipples may stick out more and become darker.  · The need to urinate more. Painful urination may mean you have a bladder infection.  · Tiring easily.  · Loss of appetite.  · Cravings for certain kinds of food.  · At first, you may gain or lose a couple of pounds.  · You may have changes in your emotions from day to day (excited to be pregnant or concerned something may go wrong with the pregnancy and baby).  · You may have more vivid and strange dreams.  HOME CARE INSTRUCTIONS   ·   It is very important to avoid all smoking, alcohol and non-prescribed drugs during your pregnancy. These affect the formation and growth of the baby. Avoid chemicals while pregnant to ensure the delivery of a healthy infant.  · Start your prenatal visits by the 12th week of pregnancy. They are usually scheduled monthly at first, then more often in the last 2 months before delivery. Keep your caregiver's appointments. Follow your caregiver's instructions regarding medicine use, blood and lab tests, exercise, and diet.  · During pregnancy, you are providing food for you and your baby. Eat regular, well-balanced  meals. Choose foods such as meat, fish, milk and other low fat dairy products, vegetables, fruits, and whole-grain breads and cereals. Your caregiver will tell you of the ideal weight gain.  · You can help morning sickness by keeping soda crackers at the bedside. Eat a couple before arising in the morning. You may want to use the crackers without salt on them.  · Eating 4 to 5 small meals rather than 3 large meals a day also may help the nausea and vomiting.  · Drinking liquids between meals instead of during meals also seems to help nausea and vomiting.  · A physical sexual relationship may be continued throughout pregnancy if there are no other problems. Problems may be early (premature) leaking of amniotic fluid from the membranes, vaginal bleeding, or belly (abdominal) pain.  · Exercise regularly if there are no restrictions. Check with your caregiver or physical therapist if you are unsure of the safety of some of your exercises. Greater weight gain will occur in the last 2 trimesters of pregnancy. Exercising will help:  · Control your weight.  · Keep you in shape.  · Prepare you for labor and delivery.  · Help you lose your pregnancy weight after you deliver your baby.  · Wear a good support or jogging bra for breast tenderness during pregnancy. This may help if worn during sleep too.  · Ask when prenatal classes are available. Begin classes when they are offered.  · Do not use hot tubs, steam rooms, or saunas.  · Wear your seat belt when driving. This protects you and your baby if you are in an accident.  · Avoid raw meat, uncooked cheese, cat litter boxes, and soil used by cats throughout the pregnancy. These carry germs that can cause birth defects in the baby.  · The first trimester is a good time to visit your dentist for your dental health. Getting your teeth cleaned is okay. Use a softer toothbrush and brush gently during pregnancy.  · Ask for help if you have financial, counseling, or nutritional needs  during pregnancy. Your caregiver will be able to offer counseling for these needs as well as refer you for other special needs.  · Do not take any medicines or herbs unless told by your caregiver.  · Inform your caregiver if there is any mental or physical domestic violence.  · Make a list of emergency phone numbers of family, friends, hospital, and police and fire departments.  · Write down your questions. Take them to your prenatal visit.  · Do not douche.  · Do not cross your legs.  · If you have to stand for long periods of time, rotate you feet or take small steps in a circle.  · You may have more vaginal secretions that may require a sanitary pad. Do not use tampons or scented sanitary pads.  MEDICINES AND DRUG USE IN PREGNANCY  ·   Take prenatal vitamins as directed. The vitamin should contain 1 milligram of folic acid. Keep all vitamins out of reach of children. Only a couple vitamins or tablets containing iron may be fatal to a baby or young child when ingested.  · Avoid use of all medicines, including herbs, over-the-counter medicines, not prescribed or suggested by your caregiver. Only take over-the-counter or prescription medicines for pain, discomfort, or fever as directed by your caregiver. Do not use aspirin, ibuprofen, or naproxen unless directed by your caregiver.  · Let your caregiver also know about herbs you may be using.  · Alcohol is related to a number of birth defects. This includes fetal alcohol syndrome. All alcohol, in any form, should be avoided completely. Smoking will cause low birth rate and premature babies.  · Street or illegal drugs are very harmful to the baby. They are absolutely forbidden. A baby born to an addicted mother will be addicted at birth. The baby will go through the same withdrawal an adult does.  · Let your caregiver know about any medicines that you have to take and for what reason you take them.  SEEK MEDICAL CARE IF:   You have any concerns or worries during your  pregnancy. It is better to call with your questions if you feel they cannot wait, rather than worry about them.  SEEK IMMEDIATE MEDICAL CARE IF:   · An unexplained oral temperature above 102° F (38.9° C) develops, or as your caregiver suggests.  · You have leaking of fluid from the vagina (birth canal). If leaking membranes are suspected, take your temperature and inform your caregiver of this when you call.  · There is vaginal spotting or bleeding. Notify your caregiver of the amount and how many pads are used.  · You develop a bad smelling vaginal discharge with a change in the color.  · You continue to feel sick to your stomach (nauseated) and have no relief from remedies suggested. You vomit blood or coffee ground-like materials.  · You lose more than 2 pounds of weight in 1 week.  · You gain more than 2 pounds of weight in 1 week and you notice swelling of your face, hands, feet, or legs.  · You gain 5 pounds or more in 1 week (even if you do not have swelling of your hands, face, legs, or feet).  · You get exposed to German measles and have never had them.  · You are exposed to fifth disease or chickenpox.  · You develop belly (abdominal) pain. Round ligament discomfort is a common non-cancerous (benign) cause of abdominal pain in pregnancy. Your caregiver still must evaluate this.  · You develop headache, fever, diarrhea, pain with urination, or shortness of breath.  · You fall or are in a car accident or have any kind of trauma.  · There is mental or physical violence in your home.  Document Released: 09/13/2001 Document Revised: 06/13/2012 Document Reviewed: 03/17/2009  Exit[employer]® Patient Information ©2014 [employer].

## 2013-11-06 LAB — PRESCRIPTION MONITORING PROFILE (19 PANEL)
AMPHETAMINE/METH: NEGATIVE ng/mL
BARBITURATE SCREEN, URINE: NEGATIVE ng/mL
BENZODIAZEPINE SCREEN, URINE: NEGATIVE ng/mL
Buprenorphine, Urine: NEGATIVE ng/mL
COCAINE METABOLITES: NEGATIVE ng/mL
CREATININE, URINE: 150.32 mg/dL (ref >20.0)
Cannabinoid Scrn, Ur: NEGATIVE ng/mL
Carisoprodol, Urine: NEGATIVE ng/mL
Fentanyl, Ur: NEGATIVE ng/mL
MDMA URINE: NEGATIVE ng/mL
Meperidine, Ur: NEGATIVE ng/mL
Methadone Screen, Urine: NEGATIVE ng/mL
Methaqualone: NEGATIVE ng/mL
NITRITES URINE, INITIAL: NEGATIVE ug/mL
OPIATE SCREEN, URINE: NEGATIVE ng/mL
OXYCODONE SCRN UR: NEGATIVE ng/mL
PH URINE, INITIAL: 6.8 pH (ref 4.5–8.9)
PROPOXYPHENE: NEGATIVE ng/mL
Phencyclidine, Ur: NEGATIVE ng/mL
TRAMADOL UR: NEGATIVE ng/mL
Tapentadol, urine: NEGATIVE ng/mL
Zolpidem, Urine: NEGATIVE ng/mL

## 2013-11-09 LAB — CULTURE, OB URINE
Colony Count: NO GROWTH
Organism ID, Bacteria: NO GROWTH

## 2013-12-03 ENCOUNTER — Ambulatory Visit (INDEPENDENT_AMBULATORY_CARE_PROVIDER_SITE_OTHER): Payer: No Typology Code available for payment source | Admitting: Advanced Practice Midwife

## 2013-12-03 VITALS — BP 121/76 | Temp 98.1°F | Wt 128.2 lb

## 2013-12-03 DIAGNOSIS — Z8759 Personal history of other complications of pregnancy, childbirth and the puerperium: Secondary | ICD-10-CM

## 2013-12-03 DIAGNOSIS — Z3480 Encounter for supervision of other normal pregnancy, unspecified trimester: Secondary | ICD-10-CM

## 2013-12-03 DIAGNOSIS — Z8742 Personal history of other diseases of the female genital tract: Secondary | ICD-10-CM

## 2013-12-03 LAB — POCT URINALYSIS DIP (DEVICE)
BILIRUBIN URINE: NEGATIVE
GLUCOSE, UA: NEGATIVE mg/dL
Hgb urine dipstick: NEGATIVE
Leukocytes, UA: NEGATIVE
Nitrite: NEGATIVE
Protein, ur: NEGATIVE mg/dL
UROBILINOGEN UA: 0.2 mg/dL (ref 0.0–1.0)
pH: 5.5 (ref 5.0–8.0)

## 2013-12-03 NOTE — Progress Notes (Signed)
Doing well.  Denies vaginal bleeding, LOF, cramping/contractions.  Has NT and first screen labs scheduled this week on 3/5.

## 2013-12-03 NOTE — Progress Notes (Signed)
p-89 

## 2013-12-05 ENCOUNTER — Encounter: Payer: Self-pay | Admitting: Family Medicine

## 2013-12-05 ENCOUNTER — Ambulatory Visit (HOSPITAL_COMMUNITY)
Admission: RE | Admit: 2013-12-05 | Discharge: 2013-12-05 | Disposition: A | Discharge status: Home / Self Care | Payer: No Typology Code available for payment source | Source: Ambulatory Visit | Attending: Family Medicine | Admitting: Family Medicine

## 2013-12-05 ENCOUNTER — Other Ambulatory Visit: Payer: Self-pay

## 2013-12-05 DIAGNOSIS — O3510X0 Maternal care for (suspected) chromosomal abnormality in fetus, unspecified, not applicable or unspecified: Secondary | ICD-10-CM | POA: Insufficient documentation

## 2013-12-05 DIAGNOSIS — Z3689 Encounter for other specified antenatal screening: Secondary | ICD-10-CM | POA: Insufficient documentation

## 2013-12-05 DIAGNOSIS — O351XX0 Maternal care for (suspected) chromosomal abnormality in fetus, not applicable or unspecified: Secondary | ICD-10-CM | POA: Insufficient documentation

## 2013-12-05 DIAGNOSIS — Z3682 Encounter for antenatal screening for nuchal translucency: Secondary | ICD-10-CM

## 2013-12-31 ENCOUNTER — Ambulatory Visit (INDEPENDENT_AMBULATORY_CARE_PROVIDER_SITE_OTHER): Payer: No Typology Code available for payment source | Admitting: Advanced Practice Midwife

## 2013-12-31 VITALS — BP 124/83 | Temp 98.0°F | Wt 131.9 lb

## 2013-12-31 DIAGNOSIS — Z3480 Encounter for supervision of other normal pregnancy, unspecified trimester: Secondary | ICD-10-CM

## 2013-12-31 DIAGNOSIS — Z23 Encounter for immunization: Secondary | ICD-10-CM

## 2013-12-31 LAB — POCT URINALYSIS DIP (DEVICE)
BILIRUBIN URINE: NEGATIVE
Glucose, UA: NEGATIVE mg/dL
HGB URINE DIPSTICK: NEGATIVE
KETONES UR: NEGATIVE mg/dL
Leukocytes, UA: NEGATIVE
NITRITE: NEGATIVE
Protein, ur: NEGATIVE mg/dL
Specific Gravity, Urine: >=1.03 (ref 1.005–1.030)
Urobilinogen, UA: 0.2 mg/dL (ref 0.0–1.0)
pH: 5.5 (ref 5.0–8.0)

## 2013-12-31 MED ORDER — CETIRIZINE HCL 10 MG PO TABS: 10.0000 mg | ORAL_TABLET | Freq: Every day | ORAL | Status: DC

## 2013-12-31 NOTE — Progress Notes (Signed)
Doing well.  Feeling some fetal flutters, denies vaginal bleeding, LOF, cramping/contractions.  Seasonal allergies.  Zyrtec 10 mg daily Rx to pharmacy.

## 2013-12-31 NOTE — Progress Notes (Signed)
P= 101 Pt. Has seasonal allergies and would like a prescription.  Declines flu vaccine.

## 2014-01-21 ENCOUNTER — Ambulatory Visit (HOSPITAL_COMMUNITY)
Admission: RE | Admit: 2014-01-21 | Discharge: 2014-01-21 | Disposition: A | Discharge status: Home / Self Care | Payer: No Typology Code available for payment source | Source: Ambulatory Visit | Attending: Advanced Practice Midwife | Admitting: Advanced Practice Midwife

## 2014-01-21 ENCOUNTER — Ambulatory Visit (HOSPITAL_COMMUNITY): Payer: No Typology Code available for payment source

## 2014-01-21 DIAGNOSIS — Z3480 Encounter for supervision of other normal pregnancy, unspecified trimester: Secondary | ICD-10-CM | POA: Diagnosis present

## 2014-01-28 ENCOUNTER — Encounter: Payer: No Typology Code available for payment source | Admitting: Obstetrics and Gynecology

## 2014-02-19 ENCOUNTER — Encounter: Payer: Self-pay | Admitting: Family Medicine

## 2014-02-19 ENCOUNTER — Ambulatory Visit (INDEPENDENT_AMBULATORY_CARE_PROVIDER_SITE_OTHER): Payer: No Typology Code available for payment source | Admitting: Family Medicine

## 2014-02-19 VITALS — BP 129/82 | HR 107 | Wt 145.4 lb

## 2014-02-19 DIAGNOSIS — Z8759 Personal history of other complications of pregnancy, childbirth and the puerperium: Secondary | ICD-10-CM

## 2014-02-19 DIAGNOSIS — Z3480 Encounter for supervision of other normal pregnancy, unspecified trimester: Secondary | ICD-10-CM

## 2014-02-19 DIAGNOSIS — R0683 Snoring: Secondary | ICD-10-CM

## 2014-02-19 DIAGNOSIS — R0609 Other forms of dyspnea: Secondary | ICD-10-CM

## 2014-02-19 DIAGNOSIS — Z8742 Personal history of other diseases of the female genital tract: Secondary | ICD-10-CM

## 2014-02-19 DIAGNOSIS — R0989 Other specified symptoms and signs involving the circulatory and respiratory systems: Secondary | ICD-10-CM

## 2014-02-19 LAB — POCT URINALYSIS DIP (DEVICE)
Bilirubin Urine: NEGATIVE
Glucose, UA: 100 mg/dL — AB
Hgb urine dipstick: NEGATIVE
Ketones, ur: NEGATIVE mg/dL
LEUKOCYTES UA: NEGATIVE
Nitrite: NEGATIVE
Protein, ur: NEGATIVE mg/dL
Specific Gravity, Urine: >=1.03 (ref 1.005–1.030)
Urobilinogen, UA: 0.2 mg/dL (ref 0.0–1.0)
pH: 6 (ref 5.0–8.0)

## 2014-02-19 NOTE — Patient Instructions (Signed)
Second Trimester of Pregnancy The second trimester is from week 13 through week 28, months 4 through 6. The second trimester is often a time when you feel your best. Your body has also adjusted to being pregnant, and you begin to feel better physically. Usually, morning sickness has lessened or quit completely, you may have more energy, and you may have an increase in appetite. The second trimester is also a time when the fetus is growing rapidly. At the end of the sixth month, the fetus is about 9 inches long and weighs about 1 pounds. You will likely begin to feel the baby move (quickening) between 18 and 20 weeks of the pregnancy. BODY CHANGES Your body goes through many changes during pregnancy. The changes vary from woman to woman.   Your weight will continue to increase. You will notice your lower abdomen bulging out.  You may begin to get stretch marks on your hips, abdomen, and breasts.  You may develop headaches that can be relieved by medicines approved by your caregiver.  You may urinate more often because the fetus is pressing on your bladder.  You may develop or continue to have heartburn as a result of your pregnancy.  You may develop constipation because certain hormones are causing the muscles that push waste through your intestines to slow down.  You may develop hemorrhoids or swollen, bulging veins (varicose veins).  You may have back pain because of the weight gain and pregnancy hormones relaxing your joints between the bones in your pelvis and as a result of a shift in weight and the muscles that support your balance.  Your breasts will continue to grow and be tender.  Your gums may bleed and may be sensitive to brushing and flossing.  Dark spots or blotches (chloasma, mask of pregnancy) may develop on your face. This will likely fade after the baby is born.  A dark line from your belly button to the pubic area (linea nigra) may appear. This will likely fade after the  baby is born. WHAT TO EXPECT AT YOUR PRENATAL VISITS During a routine prenatal visit:  You will be weighed to make sure you and the fetus are growing normally.  Your blood pressure will be taken.  Your abdomen will be measured to track your baby's growth.  The fetal heartbeat will be listened to.  Any test results from the previous visit will be discussed. Your caregiver may ask you:  How you are feeling.  If you are feeling the baby move.  If you have had any abnormal symptoms, such as leaking fluid, bleeding, severe headaches, or abdominal cramping.  If you have any questions. Other tests that may be performed during your second trimester include:  Blood tests that check for:  Low iron levels (anemia).  Gestational diabetes (between 24 and 28 weeks).  Rh antibodies.  Urine tests to check for infections, diabetes, or protein in the urine.  An ultrasound to confirm the proper growth and development of the baby.  An amniocentesis to check for possible genetic problems.  Fetal screens for spina bifida and Down syndrome. HOME CARE INSTRUCTIONS   Avoid all smoking, herbs, alcohol, and unprescribed drugs. These chemicals affect the formation and growth of the baby.  Follow your caregiver's instructions regarding medicine use. There are medicines that are either safe or unsafe to take during pregnancy.  Exercise only as directed by your caregiver. Experiencing uterine cramps is a good sign to stop exercising.  Continue to eat regular,   healthy meals.  Wear a good support bra for breast tenderness.  Do not use hot tubs, steam rooms, or saunas.  Wear your seat belt at all times when driving.  Avoid raw meat, uncooked cheese, cat litter boxes, and soil used by cats. These carry germs that can cause birth defects in the baby.  Take your prenatal vitamins.  Try taking a stool softener (if your caregiver approves) if you develop constipation. Eat more high-fiber foods,  such as fresh vegetables or fruit and whole grains. Drink plenty of fluids to keep your urine clear or pale yellow.  Take warm sitz baths to soothe any pain or discomfort caused by hemorrhoids. Use hemorrhoid cream if your caregiver approves.  If you develop varicose veins, wear support hose. Elevate your feet for 15 minutes, 3 4 times a day. Limit salt in your diet.  Avoid heavy lifting, wear low heel shoes, and practice good posture.  Rest with your legs elevated if you have leg cramps or low back pain.  Visit your dentist if you have not gone yet during your pregnancy. Use a soft toothbrush to brush your teeth and be gentle when you floss.  A sexual relationship may be continued unless your caregiver directs you otherwise.  Continue to go to all your prenatal visits as directed by your caregiver. SEEK MEDICAL CARE IF:   You have dizziness.  You have mild pelvic cramps, pelvic pressure, or nagging pain in the abdominal area.  You have persistent nausea, vomiting, or diarrhea.  You have a bad smelling vaginal discharge.  You have pain with urination. SEEK IMMEDIATE MEDICAL CARE IF:   You have a fever.  You are leaking fluid from your vagina.  You have spotting or bleeding from your vagina.  You have severe abdominal cramping or pain.  You have rapid weight gain or loss.  You have shortness of breath with chest pain.  You notice sudden or extreme swelling of your face, hands, ankles, feet, or legs.  You have not felt your baby move in over an hour.  You have severe headaches that do not go away with medicine.  You have vision changes. Document Released: 09/13/2001 Document Revised: 05/22/2013 Document Reviewed: 11/20/2012 ExitCare Patient Information 2014 [employer].  

## 2014-02-19 NOTE — Progress Notes (Signed)
+  FM, no lof, no vb, , no ctx  Complains of new onset snoring with daytime sleepiness - order sleep study  Seymour BarsYan Long is a 28 y.o. G3P0020 at [id number] by L=13 here for ROB visit.  Discussed with Patient:  -Plans to breast feed.  All questions answered. -Continue prenatal vitamins. - Reviewed genetics screen (Quad screen / first trimester screen / serum integrated screen / full integrated screen done/ not done).   -Reviewed fetal kick counts (Pt to perform daily at a time when the baby is active, lie laterally with both hands on belly in quiet room and count all movements (hiccups, shoulder rolls, obvious kicks, etc); pt is to report to clinic or MAU for less than 10 movements felt in a one hour time period-pt told as soon as she counts 10 movements the count is complete.)  - Routine precautions discussed (depression, infection s/s).   Patient provided with all pertinent phone numbers for emergencies. - RTC for any VB, regular, painful cramps/ctxs occurring at a rate of >2/10 min, fever (100.5 or higher), n/v/d, any pain that is unresolving or worsening, LOF, decreased fetal movement, CP, SOB, edema  Problems: Patient Active Problem List   Diagnosis Date Noted  . Supervision of normal subsequent pregnancy 11/05/2013  . History of miscarriage 07/20/2013    To Do: Sleep study  [ ]  Vaccines: Flu:  Tdap:  [ ]  ZOX:[id number]:mirena Edu: [x ] PTL precautions; [ ]  BF class; [ ]  childbirth class; [ ]   BF counseling;

## 2014-02-19 NOTE — Progress Notes (Signed)
Thinks she may have a yeast infection but used Monistat 1 day which has helped a little but still has itching.

## 2014-02-27 ENCOUNTER — Encounter: Payer: Self-pay | Admitting: *Deleted

## 2014-03-19 ENCOUNTER — Ambulatory Visit (INDEPENDENT_AMBULATORY_CARE_PROVIDER_SITE_OTHER): Payer: Medicaid Other | Admitting: Advanced Practice Midwife

## 2014-03-19 VITALS — BP 125/87 | HR 110 | Temp 98.1°F

## 2014-03-19 DIAGNOSIS — Z3480 Encounter for supervision of other normal pregnancy, unspecified trimester: Secondary | ICD-10-CM

## 2014-03-19 LAB — CBC
HEMATOCRIT: 28.7 % — AB (ref 36.0–46.0)
HEMOGLOBIN: 9.6 g/dL — AB (ref 12.0–15.0)
MCH: 28.7 pg (ref 26.0–34.0)
MCHC: 33.4 g/dL (ref 30.0–36.0)
MCV: 85.7 fL (ref 78.0–100.0)
Platelets: 146 10*3/uL — ABNORMAL LOW (ref 150–400)
RBC: 3.35 MIL/uL — AB (ref 3.87–5.11)
RDW: 13.4 % (ref 11.5–15.5)
WBC: 8 10*3/uL (ref 4.0–10.5)

## 2014-03-19 LAB — POCT URINALYSIS DIP (DEVICE)
BILIRUBIN URINE: NEGATIVE
GLUCOSE, UA: NEGATIVE mg/dL
Hgb urine dipstick: NEGATIVE
Leukocytes, UA: NEGATIVE
Nitrite: NEGATIVE
Protein, ur: NEGATIVE mg/dL
SPECIFIC GRAVITY, URINE: 1.025 (ref 1.005–1.030)
Urobilinogen, UA: 1 mg/dL (ref 0.0–1.0)
pH: 6 (ref 5.0–8.0)

## 2014-03-19 NOTE — Progress Notes (Signed)
Edema trace in feet. C/o constant pelvic pain. 1hour GCT- done.

## 2014-03-19 NOTE — Patient Instructions (Signed)
Glucose Tolerance Test This is a test to see how your body processes carbohydrates. This test is often done to check patients for diabetes or the possibility of developing it. PREPARATION FOR TEST You should have nothing to eat or drink 12 hours before the test. You will be given a form of sugar (glucose) and then blood samples will be drawn from your vein to determine the level of sugar in your blood. Alternatively, blood may be drawn from your finger for testing. You should not smoke or exercise during the test. NORMAL FINDINGS  Fasting: 70-115 mg/dL  30 minutes: less than 200 mg/dL  1 hour: less than 200 mg/dL  2 hours: less than 140 mg/dL  3 hours: 70-115 mg/dL  4 hours: 70-115 mg/dL Ranges for normal findings may vary among different laboratories and hospitals. You should always check with your doctor after having lab work or other tests done to discuss the meaning of your test results and whether your values are considered within normal limits. MEANING OF TEST Your caregiver will go over the test results with you and discuss the importance and meaning of your results, as well as treatment options and the need for additional tests. OBTAINING THE TEST RESULTS It is your responsibility to obtain your test results. Ask the lab or department performing the test when and how you will get your results. Document Released: 10/12/2004 Document Revised: 12/12/2011 Document Reviewed: 08/30/2008 ExitCare Patient Information 2015 [employer]. This information is not intended to replace advice given to you by your health care provider. Make sure you discuss any questions you have with your health care provider.  

## 2014-03-19 NOTE — Addendum Note (Signed)
Addended by: Candelaria StagersHAIZLIP, CANDACE E on: 03/19/2014 11:39 AM   Modules accepted: Orders

## 2014-03-19 NOTE — Progress Notes (Signed)
Glucola today. C/O bilateral wrist pain. Discussed splints. Some discomfort with ankle edema. Visually only trace. Discussed water intake and pool immersion.

## 2014-03-20 LAB — RPR

## 2014-03-20 LAB — GLUCOSE TOLERANCE, 1 HOUR (50G) W/O FASTING: GLUCOSE 1 HOUR GTT: 137 mg/dL (ref 70–140)

## 2014-03-20 LAB — HIV ANTIBODY (ROUTINE TESTING W REFLEX): HIV 1&2 Ab, 4th Generation: NONREACTIVE

## 2014-03-25 ENCOUNTER — Encounter (HOSPITAL_COMMUNITY): Payer: Self-pay | Admitting: Advanced Practice Midwife

## 2014-03-26 ENCOUNTER — Telehealth: Payer: Self-pay

## 2014-03-26 NOTE — Telephone Encounter (Signed)
Called patient and informed her of results-- to come in for 3hr at 0800 04/03/14. No questions or concerns.

## 2014-03-26 NOTE — Telephone Encounter (Signed)
Message copied by [name], [name] on Wed Mar 26, 2014  9:26 AM ------      Message from: [name], [name] [name] L      Created: Tue Mar 25, 2014  5:35 PM      Regarding: Needs 3 hr GTT       Glucola 137            3 hr gtt            [name] [name] ------

## 2014-03-30 ENCOUNTER — Encounter (HOSPITAL_BASED_OUTPATIENT_CLINIC_OR_DEPARTMENT_OTHER): Payer: Self-pay

## 2014-04-03 ENCOUNTER — Other Ambulatory Visit: Payer: Medicaid Other

## 2014-04-08 ENCOUNTER — Ambulatory Visit (INDEPENDENT_AMBULATORY_CARE_PROVIDER_SITE_OTHER): Payer: Medicaid Other | Admitting: Obstetrics & Gynecology

## 2014-04-08 VITALS — BP 101/70 | HR 103 | Temp 98.1°F | Wt 156.3 lb

## 2014-04-08 DIAGNOSIS — O99891 Other specified diseases and conditions complicating pregnancy: Secondary | ICD-10-CM

## 2014-04-08 DIAGNOSIS — O9989 Other specified diseases and conditions complicating pregnancy, childbirth and the puerperium: Secondary | ICD-10-CM

## 2014-04-08 DIAGNOSIS — Z3480 Encounter for supervision of other normal pregnancy, unspecified trimester: Secondary | ICD-10-CM

## 2014-04-08 DIAGNOSIS — Z23 Encounter for immunization: Secondary | ICD-10-CM

## 2014-04-08 DIAGNOSIS — Z3483 Encounter for supervision of other normal pregnancy, third trimester: Secondary | ICD-10-CM

## 2014-04-08 DIAGNOSIS — G5600 Carpal tunnel syndrome, unspecified upper limb: Secondary | ICD-10-CM

## 2014-04-08 DIAGNOSIS — O26899 Other specified pregnancy related conditions, unspecified trimester: Secondary | ICD-10-CM

## 2014-04-08 LAB — POCT URINALYSIS DIP (DEVICE)
Bilirubin Urine: NEGATIVE
GLUCOSE, UA: NEGATIVE mg/dL
Hgb urine dipstick: NEGATIVE
KETONES UR: NEGATIVE mg/dL
Leukocytes, UA: NEGATIVE
Nitrite: NEGATIVE
Protein, ur: 30 mg/dL — AB
SPECIFIC GRAVITY, URINE: 1.02 (ref 1.005–1.030)
Urobilinogen, UA: 0.2 mg/dL (ref 0.0–1.0)
pH: 7 (ref 5.0–8.0)

## 2014-04-08 MED ORDER — TETANUS-DIPHTH-ACELL PERTUSSIS 5-2.5-18.5 LF-MCG/0.5 IM SUSP
0.5000 mL | Freq: Once | INTRAMUSCULAR | Status: AC
  Administered 2014-04-08: 0.5 mL via INTRAMUSCULAR

## 2014-04-08 NOTE — Progress Notes (Signed)
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

## 2014-04-08 NOTE — Patient Instructions (Signed)
Carpal Tunnel Syndrome The carpal tunnel is a narrow area located on the palm side of your wrist. The tunnel is formed by the wrist bones and ligaments. Nerves, blood vessels, and tendons pass through the carpal tunnel. Repeated wrist motion or certain diseases may cause swelling within the tunnel. This swelling pinches the main nerve in the wrist (median nerve) and causes the painful hand and arm condition called carpal tunnel syndrome. CAUSES   Repeated wrist motions.  Wrist injuries.  Certain diseases like arthritis, diabetes, alcoholism, hyperthyroidism, and kidney failure.  Obesity.  Pregnancy. SYMPTOMS   A "pins and needles" feeling in your fingers or hand.  Tingling or numbness in your fingers or hand.  An aching feeling in your entire arm.  Wrist pain that goes up your arm to your shoulder.  Pain that goes down into your palm or fingers.  A weak feeling in your hands. DIAGNOSIS  Your caregiver will take your history and perform a physical exam. An electromyography test may be needed. This test measures electrical signals sent out by the muscles. The electrical signals are usually slowed by carpal tunnel syndrome. You may also need X-rays. TREATMENT  Carpal tunnel syndrome may clear up by itself. Your caregiver may recommend a wrist splint or medicine such as a nonsteroidal anti-inflammatory medicine. Cortisone injections may help. Sometimes, surgery may be needed to free the pinched nerve.  HOME CARE INSTRUCTIONS   Take all medicine as directed by your caregiver. Only take over-the-counter or prescription medicines for pain, discomfort, or fever as directed by your caregiver.  If you were given a splint to keep your wrist from bending, wear it as directed. It is important to wear the splint at night. Wear the splint for as long as you have pain or numbness in your hand, arm, or wrist. This may take 1 to 2 months.  Rest your wrist from any activity that may be causing your  pain. If your symptoms are work-related, you may need to talk to your employer about changing to a job that does not require using your wrist.  Put ice on your wrist after long periods of wrist activity.  Put ice in a plastic bag.  Place a towel between your skin and the bag.  Leave the ice on for 15-20 minutes, 03-04 times a day.  Keep all follow-up visits as directed by your caregiver. This includes any orthopedic referrals, physical therapy, and rehabilitation. Any delay in getting necessary care could result in a delay or failure of your condition to heal. SEEK IMMEDIATE MEDICAL CARE IF:   You have new, unexplained symptoms.  Your symptoms get worse and are not helped or controlled with medicines. MAKE SURE YOU:   Understand these instructions.  Will watch your condition.  Will get help right away if you are not doing well or get worse. Document Released: 09/16/2000 Document Revised: 12/12/2011 Document Reviewed: 08/05/2011 Exit[employer] Patient Information 2015 [employer]. This information is not intended to replace advice given to you by your health care provider. Make sure you discuss any questions you have with your health care provider.  

## 2014-04-08 NOTE — Progress Notes (Signed)
Pt complains of carpal tunnel

## 2014-04-10 ENCOUNTER — Other Ambulatory Visit: Payer: BC Managed Care – PPO

## 2014-04-10 DIAGNOSIS — Z3482 Encounter for supervision of other normal pregnancy, second trimester: Secondary | ICD-10-CM

## 2014-04-11 ENCOUNTER — Encounter: Payer: Self-pay | Admitting: Family Medicine

## 2014-04-11 LAB — GLUCOSE TOLERANCE, 3 HOURS
GLUCOSE, 1 HOUR-GESTATIONAL: 145 mg/dL (ref 70–189)
GLUCOSE, FASTING-GESTATIONAL: 65 mg/dL — AB (ref 70–104)
Glucose Tolerance, 2 hour: 150 mg/dL (ref 70–164)
Glucose, GTT - 3 Hour: 133 mg/dL (ref 70–144)

## 2014-04-23 ENCOUNTER — Encounter (HOSPITAL_COMMUNITY): Payer: Self-pay | Admitting: *Deleted

## 2014-04-23 ENCOUNTER — Inpatient Hospital Stay (HOSPITAL_COMMUNITY)
Admission: AD | Admit: 2014-04-23 | Discharge: 2014-04-23 | Disposition: A | Discharge status: Home / Self Care | Payer: Medicaid Other | Source: Ambulatory Visit | Attending: Obstetrics & Gynecology | Admitting: Obstetrics & Gynecology

## 2014-04-23 DIAGNOSIS — R109 Unspecified abdominal pain: Secondary | ICD-10-CM | POA: Diagnosis present

## 2014-04-23 DIAGNOSIS — N949 Unspecified condition associated with female genital organs and menstrual cycle: Secondary | ICD-10-CM | POA: Insufficient documentation

## 2014-04-23 DIAGNOSIS — O4700 False labor before 37 completed weeks of gestation, unspecified trimester: Secondary | ICD-10-CM | POA: Insufficient documentation

## 2014-04-23 DIAGNOSIS — Z87891 Personal history of nicotine dependence: Secondary | ICD-10-CM | POA: Insufficient documentation

## 2014-04-23 HISTORY — DX: Gonococcal infection, unspecified: A54.9

## 2014-04-23 LAB — URINALYSIS, ROUTINE W REFLEX MICROSCOPIC
BILIRUBIN URINE: NEGATIVE
Glucose, UA: NEGATIVE mg/dL
Hgb urine dipstick: NEGATIVE
Ketones, ur: NEGATIVE mg/dL
Leukocytes, UA: NEGATIVE
Nitrite: NEGATIVE
PH: 7 (ref 5.0–8.0)
Protein, ur: NEGATIVE mg/dL
SPECIFIC GRAVITY, URINE: 1.015 (ref 1.005–1.030)
UROBILINOGEN UA: 0.2 mg/dL (ref 0.0–1.0)

## 2014-04-23 LAB — WET PREP, GENITAL
CLUE CELLS WET PREP: NONE SEEN
TRICH WET PREP: NONE SEEN
YEAST WET PREP: NONE SEEN

## 2014-04-23 LAB — FETAL FIBRONECTIN: Fetal Fibronectin: NEGATIVE

## 2014-04-23 MED ORDER — LACTATED RINGERS IV BOLUS (SEPSIS)
500.0000 mL | Freq: Once | INTRAVENOUS | Status: AC
  Administered 2014-04-23: 500 mL via INTRAVENOUS

## 2014-04-23 NOTE — MAU Note (Signed)
Patient states she has been having mild irregular contractions for the past couple of weeks. States she has been having more abdominal pain and the baby has dropped and has an increase in pelvic pressure. States some nausea, no vomiting but no appetite. Denies bleeding or leaking and reports good fetal movement.

## 2014-04-23 NOTE — Discharge Instructions (Signed)
Keep your scheduled appointment for prenatal care. Drink 8-10 glasses of water per day. °

## 2014-04-23 NOTE — MAU Provider Note (Signed)
Chief Complaint:  Abdominal Pain, Pelvic Pain and Nausea   First Provider Initiated Contact with Patient 04/23/14 1622      HPI: [name] [name] is a 28 y.o. G3P0020 at [redacted]w[redacted]d who presents to maternity admissions reporting cramping.  Reports that for the past several weeks she has noted intermittent cramping pain. This pain seems to be worsening in frequency and severity and this morning it brought her to tears. Due to this, presented for eval.   Denies contractions, leakage of fluid or vaginal bleeding. Good fetal movement.   Pregnancy Course:   Past Medical History: Past Medical History  Diagnosis Date  . Headache(784.0)   . Infection     UTI  . Gonorrhea     Past obstetric history: OB History  Gravida Para Term Preterm AB SAB TAB Ectopic Multiple Living  3    2 2     0    # Outcome Date GA Lbr Len/2nd Weight Sex Delivery Anes PTL Lv  3 CUR           2 SAB 07/2013             Comments: System Generated. Please review and update pregnancy details.  1 SAB 03/2013              Past Surgical History: Past Surgical History  Procedure Laterality Date  . No past surgeries       Family History: Family History  Problem Relation Age of Onset  . Hearing loss Neg Hx     Social History: History  Substance Use Topics  . Smoking status: Former Games developer  . Smokeless tobacco: Never Used     Comment: not every day smoker, prior to the preg  . Alcohol Use: No    Allergies: No Known Allergies  Meds:  No prescriptions prior to admission    ROS: Pertinent findings in history of present illness.  Physical Exam  Blood pressure 111/72, pulse 76, temperature 98.5 F (36.9 C), temperature source Oral, resp. rate 16, height 5' 0.5" (1.537 m), weight 157 lb 3.2 oz (71.305 kg), last menstrual period 09/05/2013, SpO2 99.00%. GENERAL: Well-developed, well-nourished female in no acute distress.  HEENT: normocephalic HEART: normal rate RESP: normal effort ABDOMEN: Soft, non-tender, gravid  appropriate for gestational age EXTREMITIES: Nontender, no edema NEURO: alert and oriented SPECULUM EXAM: NEFG, physiologic discharge, no blood, cervix clean Dilation: Closed Effacement (%): Thick Presentation: Vertex Exam by:: Dr. Su Hilt  FHT:  Baseline 140 , moderate variability, accelerations present, no decelerations Contractions: irritability   Labs: Results for orders placed during the hospital encounter of 04/23/14 (from the past 24 hour(s))  URINALYSIS, ROUTINE W REFLEX MICROSCOPIC     Status: Abnormal   Collection Time    04/23/14  3:40 PM      Result Value Ref Range   Color, Urine YELLOW  YELLOW   APPearance HAZY (*) CLEAR   Specific Gravity, Urine 1.015  1.005 - 1.030   pH 7.0  5.0 - 8.0   Glucose, UA NEGATIVE  NEGATIVE mg/dL   Hgb urine dipstick NEGATIVE  NEGATIVE   Bilirubin Urine NEGATIVE  NEGATIVE   Ketones, ur NEGATIVE  NEGATIVE mg/dL   Protein, ur NEGATIVE  NEGATIVE mg/dL   Urobilinogen, UA 0.2  0.0 - 1.0 mg/dL   Nitrite NEGATIVE  NEGATIVE   Leukocytes, UA NEGATIVE  NEGATIVE  WET PREP, GENITAL     Status: Abnormal   Collection Time    04/23/14  5:00 PM  Result Value Ref Range   Yeast Wet Prep HPF POC NONE SEEN  NONE SEEN   Trich, Wet Prep NONE SEEN  NONE SEEN   Clue Cells Wet Prep HPF POC NONE SEEN  NONE SEEN   WBC, Wet Prep HPF POC FEW (*) NONE SEEN  FETAL FIBRONECTIN     Status: None   Collection Time    04/23/14  5:00 PM      Result Value Ref Range   Fetal Fibronectin NEGATIVE  NEGATIVE    Imaging:  No results found.  MAU Course: Pt observed in MAU for > 3 hours. Sterile speculum exam was observed and cervix was c/t/h. Fetal fibronectin was negative. U/A w/o infection and wet prep WNL. GC/Ct collected and pending. SVE performed and  Cervix was confirmed c/t/h. Likely braxton hicks contractions, reassured.  Assessment: Braxton Hicks Contractions  Plan: Discharge home Labor precautions and fetal kick counts    Medication List    ASK  your doctor about these medications       cetirizine 10 MG tablet  Commonly known as:  ZYRTEC  Take 1 tablet (10 mg total) by mouth daily.     prenatal multivitamin Tabs tablet  Take 1 tablet by mouth daily at 12 noon.        [name], MD 04/23/2014 8:54 PM

## 2014-04-24 LAB — GC/CHLAMYDIA PROBE AMP
CT Probe RNA: NEGATIVE
GC Probe RNA: NEGATIVE

## 2014-04-24 NOTE — MAU Provider Note (Signed)
Attestation of Attending Supervision of Advanced Practitioner (PA/CNM/NP): Evaluation and management procedures were performed by the Advanced Practitioner under my supervision and collaboration.  I have reviewed the Advanced Practitioner's note and chart, and I agree with the management and plan.  Shaniquia Brafford, MD, FACOG Attending Obstetrician & Gynecologist Faculty Practice, [facility]   

## 2014-04-30 ENCOUNTER — Ambulatory Visit (INDEPENDENT_AMBULATORY_CARE_PROVIDER_SITE_OTHER): Payer: Medicaid Other | Admitting: Advanced Practice Midwife

## 2014-04-30 VITALS — BP 117/75 | HR 93 | Temp 98.6°F | Wt 159.9 lb

## 2014-04-30 DIAGNOSIS — R102 Pelvic and perineal pain unspecified side: Secondary | ICD-10-CM

## 2014-04-30 DIAGNOSIS — O9989 Other specified diseases and conditions complicating pregnancy, childbirth and the puerperium: Secondary | ICD-10-CM

## 2014-04-30 DIAGNOSIS — R109 Unspecified abdominal pain: Secondary | ICD-10-CM

## 2014-04-30 DIAGNOSIS — N949 Unspecified condition associated with female genital organs and menstrual cycle: Secondary | ICD-10-CM

## 2014-04-30 DIAGNOSIS — O26893 Other specified pregnancy related conditions, third trimester: Secondary | ICD-10-CM

## 2014-04-30 DIAGNOSIS — O26899 Other specified pregnancy related conditions, unspecified trimester: Secondary | ICD-10-CM

## 2014-04-30 LAB — POCT URINALYSIS DIP (DEVICE)
Bilirubin Urine: NEGATIVE
Glucose, UA: NEGATIVE mg/dL
Hgb urine dipstick: NEGATIVE
Nitrite: NEGATIVE
Protein, ur: 30 mg/dL — AB
Specific Gravity, Urine: 1.025 (ref 1.005–1.030)
Urobilinogen, UA: 0.2 mg/dL (ref 0.0–1.0)
pH: 6.5 (ref 5.0–8.0)

## 2014-04-30 MED ORDER — CYCLOBENZAPRINE HCL 10 MG PO TABS: 5.0000 mg | ORAL_TABLET | Freq: Three times a day (TID) | ORAL | Status: DC | PRN

## 2014-04-30 NOTE — Progress Notes (Signed)
Patient reports feeling very uncomfortable, having a lot of pelvic pressure/pain

## 2014-04-30 NOTE — Progress Notes (Signed)
Good fetal movement, denies vaginal bleeding, LOF, regular contractions.  Reports cramping/contractions and hip pain making it hard to walk.  [facility] ([place]  Discussed pregnancy support belt, comfortable sleeping positions.  Rest/ice/heat/Tylenol for pain.  Flexeril 5-10 mg TID PRN sent to pharmacy.  Reviewed signs of PTL.

## 2014-05-04 ENCOUNTER — Other Ambulatory Visit: Payer: Self-pay | Admitting: Advanced Practice Midwife

## 2014-05-04 LAB — CULTURE, OB URINE

## 2014-05-04 MED ORDER — NITROFURANTOIN MONOHYD MACRO 100 MG PO CAPS: 100.0000 mg | ORAL_CAPSULE | Freq: Two times a day (BID) | ORAL | Status: DC

## 2014-05-04 NOTE — Progress Notes (Signed)
Pt urine culture at prenatal visit on 7/29 positive for UTI.  Macrobid 100 mg BID x 7 days sent to [facility] on [address].

## 2014-05-05 NOTE — Progress Notes (Signed)
Attempted to call patient. No answer. Left message stating we are calling to inform you a medication has been sent to your pharmacy for a UTI, please take medication as prescribed and in its entirety, call clinic with any questions or concerns.

## 2014-05-07 ENCOUNTER — Ambulatory Visit (INDEPENDENT_AMBULATORY_CARE_PROVIDER_SITE_OTHER): Payer: Medicaid Other | Admitting: Advanced Practice Midwife

## 2014-05-07 VITALS — BP 121/72 | HR 97 | Wt 158.6 lb

## 2014-05-07 DIAGNOSIS — O9989 Other specified diseases and conditions complicating pregnancy, childbirth and the puerperium: Secondary | ICD-10-CM

## 2014-05-07 DIAGNOSIS — O26893 Other specified pregnancy related conditions, third trimester: Secondary | ICD-10-CM

## 2014-05-07 DIAGNOSIS — R102 Pelvic and perineal pain: Principal | ICD-10-CM

## 2014-05-07 DIAGNOSIS — N949 Unspecified condition associated with female genital organs and menstrual cycle: Secondary | ICD-10-CM

## 2014-05-07 LAB — POCT URINALYSIS DIP (DEVICE)
BILIRUBIN URINE: NEGATIVE
GLUCOSE, UA: NEGATIVE mg/dL
Hgb urine dipstick: NEGATIVE
KETONES UR: NEGATIVE mg/dL
LEUKOCYTES UA: NEGATIVE
Nitrite: NEGATIVE
PROTEIN: NEGATIVE mg/dL
Specific Gravity, Urine: 1.01 (ref 1.005–1.030)
Urobilinogen, UA: 1 mg/dL (ref 0.0–1.0)
pH: 6 (ref 5.0–8.0)

## 2014-05-07 NOTE — Progress Notes (Signed)
Doing well.  Good fetal movement, denies vaginal bleeding, LOF, regular contractions.  Continues to have pelvic pain, mostly at pubic symphysis.  Reviewed sleeping with pillow between knees, good ergonomics.

## 2014-05-15 ENCOUNTER — Encounter: Payer: Medicaid Other | Admitting: Family Medicine

## 2014-05-15 ENCOUNTER — Ambulatory Visit (HOSPITAL_BASED_OUTPATIENT_CLINIC_OR_DEPARTMENT_OTHER): Payer: Medicaid Other | Attending: Family Medicine

## 2014-05-28 ENCOUNTER — Encounter: Payer: Self-pay | Admitting: Advanced Practice Midwife

## 2014-05-28 ENCOUNTER — Other Ambulatory Visit: Payer: Self-pay | Admitting: Advanced Practice Midwife

## 2014-05-28 ENCOUNTER — Ambulatory Visit (INDEPENDENT_AMBULATORY_CARE_PROVIDER_SITE_OTHER): Payer: Medicaid Other | Admitting: Advanced Practice Midwife

## 2014-05-28 VITALS — Wt 164.8 lb

## 2014-05-28 DIAGNOSIS — Z3480 Encounter for supervision of other normal pregnancy, unspecified trimester: Secondary | ICD-10-CM

## 2014-05-28 DIAGNOSIS — Z3493 Encounter for supervision of normal pregnancy, unspecified, third trimester: Secondary | ICD-10-CM

## 2014-05-28 LAB — POCT URINALYSIS DIP (DEVICE)
Bilirubin Urine: NEGATIVE
Glucose, UA: NEGATIVE mg/dL
Hgb urine dipstick: NEGATIVE
KETONES UR: NEGATIVE mg/dL
Nitrite: NEGATIVE
Protein, ur: 30 mg/dL — AB
Specific Gravity, Urine: 1.015 (ref 1.005–1.030)
UROBILINOGEN UA: 1 mg/dL (ref 0.0–1.0)
pH: 6 (ref 5.0–8.0)

## 2014-05-28 LAB — OB RESULTS CONSOLE GC/CHLAMYDIA
Chlamydia: NEGATIVE
Gonorrhea: NEGATIVE

## 2014-05-28 LAB — OB RESULTS CONSOLE GBS: STREP GROUP B AG: POSITIVE

## 2014-05-28 NOTE — Progress Notes (Signed)
Pelvic pain and contractions.

## 2014-05-28 NOTE — Progress Notes (Signed)
Very uncomfortable. Wants to stop working Sept 5.  Letter provided. Labor precautions. Cannot sweep membranes today.   GBS/cultures done.

## 2014-05-28 NOTE — Patient Instructions (Signed)

## 2014-05-29 LAB — GC/CHLAMYDIA PROBE AMP
CT Probe RNA: NEGATIVE
GC Probe RNA: NEGATIVE

## 2014-05-31 LAB — CULTURE, BETA STREP (GROUP B ONLY)

## 2014-06-03 ENCOUNTER — Encounter (HOSPITAL_COMMUNITY): Payer: Self-pay | Admitting: Advanced Practice Midwife

## 2014-06-03 DIAGNOSIS — O09293 Supervision of pregnancy with other poor reproductive or obstetric history, third trimester: Secondary | ICD-10-CM | POA: Insufficient documentation

## 2014-06-05 ENCOUNTER — Encounter: Payer: Medicaid Other | Admitting: Family Medicine

## 2014-06-06 ENCOUNTER — Ambulatory Visit (INDEPENDENT_AMBULATORY_CARE_PROVIDER_SITE_OTHER): Payer: BC Managed Care – PPO | Admitting: Physician Assistant

## 2014-06-06 VITALS — BP 117/83 | HR 89 | Temp 98.1°F | Wt 167.6 lb

## 2014-06-06 DIAGNOSIS — Z3480 Encounter for supervision of other normal pregnancy, unspecified trimester: Secondary | ICD-10-CM

## 2014-06-06 DIAGNOSIS — Z3493 Encounter for supervision of normal pregnancy, unspecified, third trimester: Secondary | ICD-10-CM

## 2014-06-06 LAB — POCT URINALYSIS DIP (DEVICE)
Bilirubin Urine: NEGATIVE
Glucose, UA: NEGATIVE mg/dL
Hgb urine dipstick: NEGATIVE
Ketones, ur: NEGATIVE mg/dL
Leukocytes, UA: NEGATIVE
NITRITE: NEGATIVE
PH: 5.5 (ref 5.0–8.0)
Protein, ur: NEGATIVE mg/dL
Specific Gravity, Urine: 1.02 (ref 1.005–1.030)
Urobilinogen, UA: 0.2 mg/dL (ref 0.0–1.0)

## 2014-06-06 NOTE — Patient Instructions (Signed)
Breastfeeding Deciding to breastfeed is one of the best choices you can make for you and your baby. A change in hormones during pregnancy causes your breast tissue to grow and increases the number and size of your milk ducts. These hormones also allow proteins, sugars, and fats from your blood supply to make breast milk in your milk-producing glands. Hormones prevent breast milk from being released before your baby is born as well as prompt milk flow after birth. Once breastfeeding has begun, thoughts of your baby, as well as his or her sucking or crying, can stimulate the release of milk from your milk-producing glands.  BENEFITS OF BREASTFEEDING For Your Baby  Your first milk (colostrum) helps your baby's digestive system function better.   There are antibodies in your milk that help your baby fight off infections.   Your baby has a lower incidence of asthma, allergies, and sudden infant death syndrome.   The nutrients in breast milk are better for your baby than infant formulas and are designed uniquely for your baby's needs.   Breast milk improves your baby's brain development.   Your baby is less likely to develop other conditions, such as childhood obesity, asthma, or type 2 diabetes mellitus.  For You   Breastfeeding helps to create a very special bond between you and your baby.   Breastfeeding is convenient. Breast milk is always available at the correct temperature and costs nothing.   Breastfeeding helps to burn calories and helps you lose the weight gained during pregnancy.   Breastfeeding makes your uterus contract to its prepregnancy size faster and slows bleeding (lochia) after you give birth.   Breastfeeding helps to lower your risk of developing type 2 diabetes mellitus, osteoporosis, and breast or ovarian cancer later in life. SIGNS THAT YOUR BABY IS HUNGRY Early Signs of Hunger  Increased alertness or activity.  Stretching.  Movement of the head from  side to side.  Movement of the head and opening of the mouth when the corner of the mouth or cheek is stroked (rooting).  Increased sucking sounds, smacking lips, cooing, sighing, or squeaking.  Hand-to-mouth movements.  Increased sucking of fingers or hands. Late Signs of Hunger  Fussing.  Intermittent crying. Extreme Signs of Hunger Signs of extreme hunger will require calming and consoling before your baby will be able to breastfeed successfully. Do not wait for the following signs of extreme hunger to occur before you initiate breastfeeding:   Restlessness.  A loud, strong cry.   Screaming. BREASTFEEDING BASICS Breastfeeding Initiation  Find a comfortable place to sit or lie down, with your neck and back well supported.  Place a pillow or rolled up blanket under your baby to bring him or her to the level of your breast (if you are seated). Nursing pillows are specially designed to help support your arms and your baby while you breastfeed.  Make sure that your baby's abdomen is facing your abdomen.   Gently massage your breast. With your fingertips, massage from your chest wall toward your nipple in a circular motion. This encourages milk flow. You may need to continue this action during the feeding if your milk flows slowly.  Support your breast with 4 fingers underneath and your thumb above your nipple. Make sure your fingers are well away from your nipple and your baby's mouth.   Stroke your baby's lips gently with your finger or nipple.   When your baby's mouth is open wide enough, quickly bring your baby to your   breast, placing your entire nipple and as much of the colored area around your nipple (areola) as possible into your baby's mouth.   More areola should be visible above your baby's upper lip than below the lower lip.   Your baby's tongue should be between his or her lower gum and your breast.   Ensure that your baby's mouth is correctly positioned  around your nipple (latched). Your baby's lips should create a seal on your breast and be turned out (everted).  It is common for your baby to suck about 2-3 minutes in order to start the flow of breast milk. Latching Teaching your baby how to latch on to your breast properly is very important. An improper latch can cause nipple pain and decreased milk supply for you and poor weight gain in your baby. Also, if your baby is not latched onto your nipple properly, he or she may swallow some air during feeding. This can make your baby fussy. Burping your baby when you switch breasts during the feeding can help to get rid of the air. However, teaching your baby to latch on properly is still the best way to prevent fussiness from swallowing air while breastfeeding. Signs that your baby has successfully latched on to your nipple:    Silent tugging or silent sucking, without causing you pain.   Swallowing heard between every 3-4 sucks.    Muscle movement above and in front of his or her ears while sucking.  Signs that your baby has not successfully latched on to nipple:   Sucking sounds or smacking sounds from your baby while breastfeeding.  Nipple pain. If you think your baby has not latched on correctly, slip your finger into the corner of your baby's mouth to break the suction and place it between your baby's gums. Attempt breastfeeding initiation again. Signs of Successful Breastfeeding Signs from your baby:   A gradual decrease in the number of sucks or complete cessation of sucking.   Falling asleep.   Relaxation of his or her body.   Retention of a small amount of milk in his or her mouth.   Letting go of your breast by himself or herself. Signs from you:  Breasts that have increased in firmness, weight, and size 1-3 hours after feeding.   Breasts that are softer immediately after breastfeeding.  Increased milk volume, as well as a change in milk consistency and color by  the fifth day of breastfeeding.   Nipples that are not sore, cracked, or bleeding. Signs That Your Baby is Getting Enough Milk  Wetting at least 3 diapers in a 24-hour period. The urine should be clear and pale yellow by age 5 days.  At least 3 stools in a 24-hour period by age 5 days. The stool should be soft and yellow.  At least 3 stools in a 24-hour period by age 7 days. The stool should be seedy and yellow.  No loss of weight greater than 10% of birth weight during the first 3 days of age.  Average weight gain of 4-7 ounces (113-198 g) per week after age 4 days.  Consistent daily weight gain by age 5 days, without weight loss after the age of 2 weeks. After a feeding, your baby may spit up a small amount. This is common. BREASTFEEDING FREQUENCY AND DURATION Frequent feeding will help you make more milk and can prevent sore nipples and breast engorgement. Breastfeed when you feel the need to reduce the fullness of your breasts   or when your baby shows signs of hunger. This is called "breastfeeding on demand." Avoid introducing a pacifier to your baby while you are working to establish breastfeeding (the first 4-6 weeks after your baby is born). After this time you may choose to use a pacifier. Research has shown that pacifier use during the first year of a baby's life decreases the risk of sudden infant death syndrome (SIDS). Allow your baby to feed on each breast as long as he or she wants. Breastfeed until your baby is finished feeding. When your baby unlatches or falls asleep while feeding from the first breast, offer the second breast. Because newborns are often sleepy in the first few weeks of life, you may need to awaken your baby to get him or her to feed. Breastfeeding times will vary from baby to baby. However, the following rules can serve as a guide to help you ensure that your baby is properly fed:  Newborns (babies 4 weeks of age or younger) may breastfeed every 1-3  hours.  Newborns should not go longer than 3 hours during the day or 5 hours during the night without breastfeeding.  You should breastfeed your baby a minimum of 8 times in a 24-hour period until you begin to introduce solid foods to your baby at around 6 months of age. BREAST MILK PUMPING Pumping and storing breast milk allows you to ensure that your baby is exclusively fed your breast milk, even at times when you are unable to breastfeed. This is especially important if you are going back to work while you are still breastfeeding or when you are not able to be present during feedings. Your lactation consultant can give you guidelines on how long it is safe to store breast milk.  A breast pump is a machine that allows you to pump milk from your breast into a sterile bottle. The pumped breast milk can then be stored in a refrigerator or freezer. Some breast pumps are operated by hand, while others use electricity. Ask your lactation consultant which type will work best for you. Breast pumps can be purchased, but some hospitals and breastfeeding support groups lease breast pumps on a monthly basis. A lactation consultant can teach you how to hand express breast milk, if you prefer not to use a pump.  CARING FOR YOUR BREASTS WHILE YOU BREASTFEED Nipples can become dry, cracked, and sore while breastfeeding. The following recommendations can help keep your breasts moisturized and healthy:  Avoid using soap on your nipples.   Wear a supportive bra. Although not required, special nursing bras and tank tops are designed to allow access to your breasts for breastfeeding without taking off your entire bra or top. Avoid wearing underwire-style bras or extremely tight bras.  Air dry your nipples for 3-4minutes after each feeding.   Use only cotton bra pads to absorb leaked breast milk. Leaking of breast milk between feedings is normal.   Use lanolin on your nipples after breastfeeding. Lanolin helps to  maintain your skin's normal moisture barrier. If you use pure lanolin, you do not need to wash it off before feeding your baby again. Pure lanolin is not toxic to your baby. You may also hand express a few drops of breast milk and gently massage that milk into your nipples and allow the milk to air dry. In the first few weeks after giving birth, some women experience extremely full breasts (engorgement). Engorgement can make your breasts feel heavy, warm, and tender to the   touch. Engorgement peaks within 3-5 days after you give birth. The following recommendations can help ease engorgement:  Completely empty your breasts while breastfeeding or pumping. You may want to start by applying warm, moist heat (in the shower or with warm water-soaked hand towels) just before feeding or pumping. This increases circulation and helps the milk flow. If your baby does not completely empty your breasts while breastfeeding, pump any extra milk after he or she is finished.  Wear a snug bra (nursing or regular) or tank top for 1-2 days to signal your body to slightly decrease milk production.  Apply ice packs to your breasts, unless this is too uncomfortable for you.  Make sure that your baby is latched on and positioned properly while breastfeeding. If engorgement persists after 48 hours of following these recommendations, contact your health care provider or a lactation consultant. OVERALL HEALTH CARE RECOMMENDATIONS WHILE BREASTFEEDING  Eat healthy foods. Alternate between meals and snacks, eating 3 of each per day. Because what you eat affects your breast milk, some of the foods may make your baby more irritable than usual. Avoid eating these foods if you are sure that they are negatively affecting your baby.  Drink milk, fruit juice, and water to satisfy your thirst (about 10 glasses a day).   Rest often, relax, and continue to take your prenatal vitamins to prevent fatigue, stress, and anemia.  Continue  breast self-awareness checks.  Avoid chewing and smoking tobacco.  Avoid alcohol and drug use. Some medicines that may be harmful to your baby can pass through breast milk. It is important to ask your health care provider before taking any medicine, including all over-the-counter and prescription medicine as well as vitamin and herbal supplements. It is possible to become pregnant while breastfeeding. If birth control is desired, ask your health care provider about options that will be safe for your baby. SEEK MEDICAL CARE IF:   You feel like you want to stop breastfeeding or have become frustrated with breastfeeding.  You have painful breasts or nipples.  Your nipples are cracked or bleeding.  Your breasts are red, tender, or warm.  You have a swollen area on either breast.  You have a fever or chills.  You have nausea or vomiting.  You have drainage other than breast milk from your nipples.  Your breasts do not become full before feedings by the fifth day after you give birth.  You feel sad and depressed.  Your baby is too sleepy to eat well.  Your baby is having trouble sleeping.   Your baby is wetting less than 3 diapers in a 24-hour period.  Your baby has less than 3 stools in a 24-hour period.  Your baby's skin or the white part of his or her eyes becomes yellow.   Your baby is not gaining weight by 5 days of age. SEEK IMMEDIATE MEDICAL CARE IF:   Your baby is overly tired (lethargic) and does not want to wake up and feed.  Your baby develops an unexplained fever. Document Released: 09/19/2005 Document Revised: 09/24/2013 Document Reviewed: 03/13/2013 Exit[employer] Patient Information 2015 [employer]. This information is not intended to replace advice given to you by your health care provider. Make sure you discuss any questions you have with your health care provider.  

## 2014-06-06 NOTE — Progress Notes (Signed)
Reports pelvic pressure and irregular contractions. Edema in feet.  
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
## 2014-06-10 ENCOUNTER — Encounter (HOSPITAL_COMMUNITY): Payer: Self-pay | Admitting: Pediatrics

## 2014-06-10 ENCOUNTER — Inpatient Hospital Stay (HOSPITAL_COMMUNITY)
Admission: AD | Admit: 2014-06-10 | Discharge: 2014-06-10 | Disposition: A | Discharge status: Home / Self Care | Payer: Medicaid Other | Source: Ambulatory Visit | Attending: Family Medicine | Admitting: Family Medicine

## 2014-06-10 DIAGNOSIS — Z87891 Personal history of nicotine dependence: Secondary | ICD-10-CM | POA: Insufficient documentation

## 2014-06-10 DIAGNOSIS — O479 False labor, unspecified: Secondary | ICD-10-CM

## 2014-06-10 MED ORDER — BUTORPHANOL TARTRATE 1 MG/ML IJ SOLN
1.0000 mg | INTRAMUSCULAR | Status: AC
  Administered 2014-06-10: 1 mg via INTRAMUSCULAR
  Filled 2014-06-10: qty 1

## 2014-06-10 MED ORDER — PROMETHAZINE HCL 25 MG/ML IJ SOLN
25.0000 mg | Freq: Four times a day (QID) | INTRAMUSCULAR | Status: DC | PRN
  Administered 2014-06-10: 25 mg via INTRAMUSCULAR
  Filled 2014-06-10: qty 1

## 2014-06-10 MED ORDER — OXYCODONE-ACETAMINOPHEN 5-325 MG PO TABS: 2.0000 | ORAL_TABLET | ORAL | Status: DC

## 2014-06-10 NOTE — MAU Provider Note (Signed)
Attestation of Attending Supervision of Advanced Practitioner (PA/CNM/NP): Evaluation and management procedures were performed by the Advanced Practitioner under my supervision and collaboration.  I have reviewed the Advanced Practitioner's note and chart, and I agree with the management and plan.  Jacob Stinson, DO Attending Physician Faculty Practice, [facility]  

## 2014-06-10 NOTE — MAU Note (Signed)
Pt states she started feeling contractions around 9 this morning. Stated she felt like she peed on herself and more fluid come out around 1400. She cannot tell if any more has come out since that time. Denies bleeding.

## 2014-06-10 NOTE — MAU Provider Note (Signed)
Chief Complaint:  Labor Eval   First Provider Initiated Contact with Patient 06/10/14 1555      HPI: [name] [name] is a 28 y.o. G3P0020 at 26w5dwho presents to maternity admissions breathing with contractions, brought to room in wheelchair from MAU lobby.  CNM called to room to examine pt quickly.  She reports contractions started around 9 am today and are around 5 minutes apart each lasting 1 minute.  She reports good fetal movement, denies LOF, vaginal bleeding, vaginal itching/burning, urinary symptoms, h/a, dizziness, n/v, or fever/chills.    Past Medical History: Past Medical History  Diagnosis Date  . Headache(784.0)   . Infection     UTI  . Gonorrhea     Past obstetric history: OB History  Gravida Para Term Preterm AB SAB TAB Ectopic Multiple Living  0    # Outcome Date GA Lbr Len/2nd Weight Sex Delivery Anes PTL Lv  3 CUR           2 SAB 07/2013             Comments: System Generated. Please review and update pregnancy details.  1 SAB 03/2013              Past Surgical History: Past Surgical History  Procedure Laterality Date  . No past surgeries      Family History: Family History  Problem Relation Age of Onset  . Hearing loss Neg Hx     Social History: History  Substance Use Topics  . Smoking status: Former Games developer  . Smokeless tobacco: Never Used     Comment: not every day smoker, prior to the preg  . Alcohol Use: No    Allergies: No Known Allergies  Meds:  Prescriptions prior to admission  Medication Sig Dispense Refill  . cyclobenzaprine (FLEXERIL) 10 MG tablet Take 0.5-1 tablets (5-10 mg total) by mouth 3 (three) times daily as needed for muscle spasms.  30 tablet  0  . Prenatal Vit-Fe Fumarate-FA (PRENATAL MULTIVITAMIN) TABS tablet Take 1 tablet by mouth daily at 12 noon.        ROS: Pertinent findings in history of present illness.  Physical Exam  Blood pressure 126/70, pulse 102, temperature 98.5 F (36.9 C), temperature source  Oral, resp. rate 18, last menstrual period 09/05/2013, SpO2 98.00%. GENERAL: Well-developed, well-nourished female in no acute distress.  HEENT: normocephalic HEART: normal rate RESP: normal effort ABDOMEN: Soft, non-tender, gravid appropriate for gestational age EXTREMITIES: Nontender, no edema NEURO: alert and oriented  Dilation: 1 Effacement (%): 70 Cervical Position: Middle Station: -3 Presentation: Vertex Exam by:: Leftwich-Kirby, CNM  Cervix unchanged in 2 hours in MAU  FHT:  Baseline 135 , moderate variability, accelerations present, no decelerations Contractions: q 7-8 mins,mild to moderate to palpation    Assessment: Early/prodromal labor Threatened labor at term  Plan: Stadol 1 mg/ Phenergan 25 mg IM x 1 dose in MAU Discharge home Labor precautions and fetal kick counts F/U as scheduled in [facility] Return to MAU as needed    Medication List    ASK your doctor about these medications       cyclobenzaprine 10 MG tablet  Commonly known as:  FLEXERIL  Take 0.5-1 tablets (5-10 mg total) by mouth 3 (three) times daily as needed for muscle spasms.     prenatal multivitamin Tabs tablet  Take 1 tablet by mouth daily at 12 noon.        Sharen Counter Certified  Nurse-Midwife 06/10/2014 5:02 PM

## 2014-06-10 NOTE — MAU Note (Signed)
Contracting, ? How often, getting stronger, denies problems with preg.  No bleeding.?peed on self this morning, this afternoon felt wet again.

## 2014-06-10 NOTE — MAU Note (Signed)
Pt brought back, CNM to bedside. Pt very uncomfortable

## 2014-06-11 ENCOUNTER — Ambulatory Visit (INDEPENDENT_AMBULATORY_CARE_PROVIDER_SITE_OTHER): Payer: Medicaid Other | Admitting: Advanced Practice Midwife

## 2014-06-11 VITALS — BP 122/94 | HR 86 | Wt 166.8 lb

## 2014-06-11 DIAGNOSIS — Z3493 Encounter for supervision of normal pregnancy, unspecified, third trimester: Secondary | ICD-10-CM

## 2014-06-11 DIAGNOSIS — Z3480 Encounter for supervision of other normal pregnancy, unspecified trimester: Secondary | ICD-10-CM

## 2014-06-11 DIAGNOSIS — O26893 Other specified pregnancy related conditions, third trimester: Secondary | ICD-10-CM

## 2014-06-11 DIAGNOSIS — N898 Other specified noninflammatory disorders of vagina: Secondary | ICD-10-CM

## 2014-06-11 DIAGNOSIS — O9989 Other specified diseases and conditions complicating pregnancy, childbirth and the puerperium: Secondary | ICD-10-CM

## 2014-06-11 LAB — POCT URINALYSIS DIP (DEVICE)
Bilirubin Urine: NEGATIVE
Glucose, UA: NEGATIVE mg/dL
Hgb urine dipstick: NEGATIVE
Ketones, ur: NEGATIVE mg/dL
Leukocytes, UA: NEGATIVE
Nitrite: NEGATIVE
Protein, ur: NEGATIVE mg/dL
Specific Gravity, Urine: 1.02 (ref 1.005–1.030)
Urobilinogen, UA: 1 mg/dL (ref 0.0–1.0)
pH: 6.5 (ref 5.0–8.0)

## 2014-06-11 NOTE — Patient Instructions (Signed)
Braxton Hicks Contractions Contractions of the uterus can occur throughout pregnancy. Contractions are not always a sign that you are in labor.  WHAT ARE BRAXTON HICKS CONTRACTIONS?  Contractions that occur before labor are called Braxton Hicks contractions, or false labor. Toward the end of pregnancy (32-34 weeks), these contractions can develop more often and may become more forceful. This is not true labor because these contractions do not result in opening (dilatation) and thinning of the cervix. They are sometimes difficult to tell apart from true labor because these contractions can be forceful and people have different pain tolerances. You should not feel embarrassed if you go to the hospital with false labor. Sometimes, the only way to tell if you are in true labor is for your health care provider to look for changes in the cervix. If there are no prenatal problems or other health problems associated with the pregnancy, it is completely safe to be sent home with false labor and await the onset of true labor. HOW CAN YOU TELL THE DIFFERENCE BETWEEN TRUE AND FALSE LABOR? False Labor  The contractions of false labor are usually shorter and not as hard as those of true labor.   The contractions are usually irregular.   The contractions are often felt in the front of the lower abdomen and in the groin.   The contractions may go away when you walk around or change positions while lying down.   The contractions get weaker and are shorter lasting as time goes on.   The contractions do not usually become progressively stronger, regular, and closer together as with true labor.  True Labor  Contractions in true labor last 30-70 seconds, become very regular, usually become more intense, and increase in frequency.   The contractions do not go away with walking.   The discomfort is usually felt in the top of the uterus and spreads to the lower abdomen and low back.   True labor can be  determined by your health care provider with an exam. This will show that the cervix is dilating and getting thinner.  WHAT TO REMEMBER  Keep up with your usual exercises and follow other instructions given by your health care provider.   Take medicines as directed by your health care provider.   Keep your regular prenatal appointments.   Eat and drink lightly if you think you are going into labor.   If Braxton Hicks contractions are making you uncomfortable:   Change your position from lying down or resting to walking, or from walking to resting.   Sit and rest in a tub of warm water.   Drink 2-3 glasses of water. Dehydration may cause these contractions.   Do slow and deep breathing several times an hour.  WHEN SHOULD I SEEK IMMEDIATE MEDICAL CARE? Seek immediate medical care if:  Your contractions become stronger, more regular, and closer together.   You have fluid leaking or gushing from your vagina.   You have a fever.   You pass blood-tinged mucus.   You have vaginal bleeding.   You have continuous abdominal pain.   You have low back pain that you never had before.   You feel your baby's head pushing down and causing pelvic pressure.   Your baby is not moving as much as it used to.  Document Released: 09/19/2005 Document Revised: 09/24/2013 Document Reviewed: 07/01/2013 Exit[employer] Patient Information 2015 [employer]. This information is not intended to replace advice given to you by your health care   provider. Make sure you discuss any questions you have with your health care provider.  Fetal Movement Counts Patient Name: __________________________________________________ Patient Due Date: ____________________ Performing a fetal movement count is highly recommended in high-risk pregnancies, but it is good for every pregnant woman to do. Your health care provider may ask you to start counting fetal movements at 28 weeks of the pregnancy. Fetal  movements often increase:  After eating a full meal.  After physical activity.  After eating or drinking something sweet or cold.  At rest. Pay attention to when you feel the baby is most active. This will help you notice a pattern of your baby's sleep and wake cycles and what factors contribute to an increase in fetal movement. It is important to perform a fetal movement count at the same time each day when your baby is normally most active.  HOW TO COUNT FETAL MOVEMENTS 1. Find a quiet and comfortable area to sit or lie down on your left side. Lying on your left side provides the best blood and oxygen circulation to your baby. 2. Write down the day and time on a sheet of paper or in a journal. 3. Start counting kicks, flutters, swishes, rolls, or jabs in a 2-hour period. You should feel at least 10 movements within 2 hours. 4. If you do not feel 10 movements in 2 hours, wait 2-3 hours and count again. Look for a change in the pattern or not enough counts in 2 hours. SEEK MEDICAL CARE IF:  You feel less than 10 counts in 2 hours, tried twice.  There is no movement in over an hour.  The pattern is changing or taking longer each day to reach 10 counts in 2 hours.  You feel the baby is not moving as he or she usually does. Date: ____________ Movements: ____________ Start time: ____________ Finish time: ____________  Date: ____________ Movements: ____________ Start time: ____________ Finish time: ____________ Date: ____________ Movements: ____________ Start time: ____________ Finish time: ____________ Date: ____________ Movements: ____________ Start time: ____________ Finish time: ____________ Date: ____________ Movements: ____________ Start time: ____________ Finish time: ____________ Date: ____________ Movements: ____________ Start time: ____________ Finish time: ____________ Date: ____________ Movements: ____________ Start time: ____________ Finish time: ____________ Date: ____________  Movements: ____________ Start time: ____________ Finish time: ____________  Date: ____________ Movements: ____________ Start time: ____________ Finish time: ____________ Date: ____________ Movements: ____________ Start time: ____________ Finish time: ____________ Date: ____________ Movements: ____________ Start time: ____________ Finish time: ____________ Date: ____________ Movements: ____________ Start time: ____________ Finish time: ____________ Date: ____________ Movements: ____________ Start time: ____________ Finish time: ____________ Date: ____________ Movements: ____________ Start time: ____________ Finish time: ____________ Date: ____________ Movements: ____________ Start time: ____________ Finish time: ____________  Date: ____________ Movements: ____________ Start time: ____________ Finish time: ____________ Date: ____________ Movements: ____________ Start time: ____________ Finish time: ____________ Date: ____________ Movements: ____________ Start time: ____________ Finish time: ____________ Date: ____________ Movements: ____________ Start time: ____________ Finish time: ____________ Date: ____________ Movements: ____________ Start time: ____________ Finish time: ____________ Date: ____________ Movements: ____________ Start time: ____________ Finish time: ____________ Date: ____________ Movements: ____________ Start time: ____________ Finish time: ____________  Date: ____________ Movements: ____________ Start time: ____________ Finish time: ____________ Date: ____________ Movements: ____________ Start time: ____________ Finish time: ____________ Date: ____________ Movements: ____________ Start time: ____________ Finish time: ____________ Date: ____________ Movements: ____________ Start time: ____________ Finish time: ____________ Date: ____________ Movements: ____________ Start time: ____________ Finish time: ____________ Date: ____________ Movements: ____________ Start time:  ____________ Finish time: ____________ Date: ____________ Movements:   ____________ Start time: ____________ Finish time: ____________  Date: ____________ Movements: ____________ Start time: ____________ Finish time: ____________ Date: ____________ Movements: ____________ Start time: ____________ Finish time: ____________ Date: ____________ Movements: ____________ Start time: ____________ Finish time: ____________ Date: ____________ Movements: ____________ Start time: ____________ Finish time: ____________ Date: ____________ Movements: ____________ Start time: ____________ Finish time: ____________ Date: ____________ Movements: ____________ Start time: ____________ Finish time: ____________ Date: ____________ Movements: ____________ Start time: ____________ Finish time: ____________  Date: ____________ Movements: ____________ Start time: ____________ Finish time: ____________ Date: ____________ Movements: ____________ Start time: ____________ Finish time: ____________ Date: ____________ Movements: ____________ Start time: ____________ Finish time: ____________ Date: ____________ Movements: ____________ Start time: ____________ Finish time: ____________ Date: ____________ Movements: ____________ Start time: ____________ Finish time: ____________ Date: ____________ Movements: ____________ Start time: ____________ Finish time: ____________ Date: ____________ Movements: ____________ Start time: ____________ Finish time: ____________  Date: ____________ Movements: ____________ Start time: ____________ Finish time: ____________ Date: ____________ Movements: ____________ Start time: ____________ Finish time: ____________ Date: ____________ Movements: ____________ Start time: ____________ Finish time: ____________ Date: ____________ Movements: ____________ Start time: ____________ Finish time: ____________ Date: ____________ Movements: ____________ Start time: ____________ Finish time: ____________ Date:  ____________ Movements: ____________ Start time: ____________ Finish time: ____________ Date: ____________ Movements: ____________ Start time: ____________ Finish time: ____________  Date: ____________ Movements: ____________ Start time: ____________ Finish time: ____________ Date: ____________ Movements: ____________ Start time: ____________ Finish time: ____________ Date: ____________ Movements: ____________ Start time: ____________ Finish time: ____________ Date: ____________ Movements: ____________ Start time: ____________ Finish time: ____________ Date: ____________ Movements: ____________ Start time: ____________ Finish time: ____________ Date: ____________ Movements: ____________ Start time: ____________ Finish time: ____________ Document Released: 10/19/2006 Document Revised: 02/03/2014 Document Reviewed: 07/16/2012 Exit[employer] Patient Information 2015 [employer]. This information is not intended to replace advice given to you by your health care provider. Make sure you discuss any questions you have with your health care provider.  

## 2014-06-11 NOTE — Progress Notes (Signed)
Pain- front of stomach   Pressure-vaginal pressure  Went to MAU yesterday for contractions and ? Water breaking Flu vaccine consented and info given

## 2014-06-11 NOTE — Progress Notes (Signed)
Very uncomfortable w/ pelvic pressure UC's. Seen in MAU for same and R?O ROM last night in MAU. Unsure if having further leaking. Pool and Fern neg. No Cervical change. Membranes swept.

## 2014-06-12 ENCOUNTER — Inpatient Hospital Stay (HOSPITAL_COMMUNITY)
Admission: AD | Admit: 2014-06-12 | Discharge: 2014-06-16 | DRG: 775 | Disposition: A | Discharge status: Home / Self Care | Payer: BC Managed Care – PPO | Source: Ambulatory Visit | Attending: Obstetrics & Gynecology | Admitting: Obstetrics & Gynecology

## 2014-06-12 ENCOUNTER — Encounter (HOSPITAL_COMMUNITY): Payer: Self-pay | Admitting: *Deleted

## 2014-06-12 ENCOUNTER — Inpatient Hospital Stay (HOSPITAL_COMMUNITY): Payer: BC Managed Care – PPO | Admitting: Anesthesiology

## 2014-06-12 ENCOUNTER — Encounter (HOSPITAL_COMMUNITY): Payer: BC Managed Care – PPO | Admitting: Anesthesiology

## 2014-06-12 DIAGNOSIS — Z22330 Carrier of Group B streptococcus: Secondary | ICD-10-CM | POA: Diagnosis not present

## 2014-06-12 DIAGNOSIS — IMO0001 Reserved for inherently not codable concepts without codable children: Secondary | ICD-10-CM

## 2014-06-12 DIAGNOSIS — O99892 Other specified diseases and conditions complicating childbirth: Secondary | ICD-10-CM | POA: Diagnosis present

## 2014-06-12 DIAGNOSIS — O9989 Other specified diseases and conditions complicating pregnancy, childbirth and the puerperium: Secondary | ICD-10-CM

## 2014-06-12 DIAGNOSIS — Z8759 Personal history of other complications of pregnancy, childbirth and the puerperium: Secondary | ICD-10-CM | POA: Diagnosis not present

## 2014-06-12 DIAGNOSIS — O4290 Premature rupture of membranes, unspecified as to length of time between rupture and onset of labor, unspecified weeks of gestation: Principal | ICD-10-CM | POA: Diagnosis present

## 2014-06-12 DIAGNOSIS — O09293 Supervision of pregnancy with other poor reproductive or obstetric history, third trimester: Secondary | ICD-10-CM

## 2014-06-12 DIAGNOSIS — Z87891 Personal history of nicotine dependence: Secondary | ICD-10-CM | POA: Diagnosis not present

## 2014-06-12 DIAGNOSIS — O99891 Other specified diseases and conditions complicating pregnancy: Secondary | ICD-10-CM | POA: Diagnosis present

## 2014-06-12 HISTORY — DX: Unspecified abnormal cytological findings in specimens from vagina: R87.629

## 2014-06-12 LAB — TYPE AND SCREEN
ABO/RH(D): B POS
Antibody Screen: NEGATIVE

## 2014-06-12 LAB — CBC
HCT: 31 % — ABNORMAL LOW (ref 36.0–46.0)
Hemoglobin: 9.6 g/dL — ABNORMAL LOW (ref 12.0–15.0)
MCH: 23.2 pg — ABNORMAL LOW (ref 26.0–34.0)
MCHC: 31 g/dL (ref 30.0–36.0)
MCV: 75.1 fL — ABNORMAL LOW (ref 78.0–100.0)
PLATELETS: 142 10*3/uL — AB (ref 150–400)
RBC: 4.13 MIL/uL (ref 3.87–5.11)
RDW: 16.2 % — ABNORMAL HIGH (ref 11.5–15.5)
WBC: 7.7 10*3/uL (ref 4.0–10.5)

## 2014-06-12 LAB — RPR

## 2014-06-12 LAB — POCT FERN TEST: POCT FERN TEST: POSITIVE

## 2014-06-12 MED ORDER — NALBUPHINE HCL 10 MG/ML IJ SOLN
10.0000 mg | INTRAMUSCULAR | Status: DC | PRN
  Administered 2014-06-12 (×2): 10 mg via INTRAVENOUS
  Filled 2014-06-12: qty 1

## 2014-06-12 MED ORDER — LACTATED RINGERS IV SOLN
500.0000 mL | Freq: Once | INTRAVENOUS | Status: AC
  Administered 2014-06-12: 500 mL via INTRAVENOUS

## 2014-06-12 MED ORDER — CITRIC ACID-SODIUM CITRATE 334-500 MG/5ML PO SOLN
30.0000 mL | ORAL | Status: DC | PRN
  Filled 2014-06-12: qty 15

## 2014-06-12 MED ORDER — INFLUENZA VAC SPLIT QUAD 0.5 ML IM SUSY: 0.5000 mL | PREFILLED_SYRINGE | INTRAMUSCULAR | Status: DC

## 2014-06-12 MED ORDER — LIDOCAINE HCL (PF) 1 % IJ SOLN
30.0000 mL | INTRAMUSCULAR | Status: DC | PRN
  Filled 2014-06-12: qty 30

## 2014-06-12 MED ORDER — PHENYLEPHRINE 40 MCG/ML (10ML) SYRINGE FOR IV PUSH (FOR BLOOD PRESSURE SUPPORT)
80.0000 ug | PREFILLED_SYRINGE | INTRAVENOUS | Status: DC | PRN
  Filled 2014-06-12: qty 2
  Filled 2014-06-12 (×2): qty 10

## 2014-06-12 MED ORDER — DIPHENHYDRAMINE HCL 50 MG/ML IJ SOLN: 12.5000 mg | INTRAMUSCULAR | Status: DC | PRN

## 2014-06-12 MED ORDER — LIDOCAINE HCL (PF) 1 % IJ SOLN
INTRAMUSCULAR | Status: DC | PRN
  Administered 2014-06-12 (×2): 5 mL

## 2014-06-12 MED ORDER — OXYTOCIN 40 UNITS IN LACTATED RINGERS INFUSION - SIMPLE MED
62.5000 mL/h | INTRAVENOUS | Status: DC
  Filled 2014-06-12: qty 1000

## 2014-06-12 MED ORDER — NALBUPHINE HCL 10 MG/ML IJ SOLN
5.0000 mg | INTRAMUSCULAR | Status: DC | PRN
  Filled 2014-06-12: qty 1

## 2014-06-12 MED ORDER — FENTANYL CITRATE 0.05 MG/ML IJ SOLN: 100.0000 ug | INTRAMUSCULAR | Status: DC | PRN

## 2014-06-12 MED ORDER — ONDANSETRON HCL 4 MG/2ML IJ SOLN
4.0000 mg | Freq: Four times a day (QID) | INTRAMUSCULAR | Status: DC | PRN
  Administered 2014-06-12: 4 mg via INTRAVENOUS
  Filled 2014-06-12: qty 2

## 2014-06-12 MED ORDER — ACETAMINOPHEN 325 MG PO TABS: 650.0000 mg | ORAL_TABLET | ORAL | Status: DC | PRN

## 2014-06-12 MED ORDER — TERBUTALINE SULFATE 1 MG/ML IJ SOLN: 0.2500 mg | Freq: Once | INTRAMUSCULAR | Status: AC | PRN

## 2014-06-12 MED ORDER — OXYTOCIN BOLUS FROM INFUSION
500.0000 mL | INTRAVENOUS | Status: DC
  Administered 2014-06-13: 500 mL via INTRAVENOUS

## 2014-06-12 MED ORDER — PENICILLIN G POTASSIUM 5000000 UNITS IJ SOLR
5.0000 10*6.[IU] | Freq: Once | INTRAVENOUS | Status: AC
  Administered 2014-06-12: 5 10*6.[IU] via INTRAVENOUS
  Filled 2014-06-12: qty 5

## 2014-06-12 MED ORDER — EPHEDRINE 5 MG/ML INJ
10.0000 mg | INTRAVENOUS | Status: DC | PRN
  Filled 2014-06-12: qty 2

## 2014-06-12 MED ORDER — LACTATED RINGERS IV SOLN
INTRAVENOUS | Status: DC
  Administered 2014-06-12 – 2014-06-13 (×4): via INTRAVENOUS

## 2014-06-12 MED ORDER — PENICILLIN G POTASSIUM 5000000 UNITS IJ SOLR
2.5000 10*6.[IU] | INTRAVENOUS | Status: DC
  Administered 2014-06-12 – 2014-06-13 (×9): 2.5 10*6.[IU] via INTRAVENOUS
  Filled 2014-06-12 (×14): qty 2.5

## 2014-06-12 MED ORDER — OXYTOCIN 40 UNITS IN LACTATED RINGERS INFUSION - SIMPLE MED
1.0000 m[IU]/min | INTRAVENOUS | Status: DC
  Administered 2014-06-12: 2 m[IU]/min via INTRAVENOUS
  Administered 2014-06-12: 10 m[IU]/min via INTRAVENOUS
  Filled 2014-06-12: qty 1000

## 2014-06-12 MED ORDER — OXYCODONE-ACETAMINOPHEN 5-325 MG PO TABS
1.0000 | ORAL_TABLET | ORAL | Status: DC | PRN
  Administered 2014-06-13 – 2014-06-16 (×7): 1 via ORAL
  Filled 2014-06-12 (×7): qty 1

## 2014-06-12 MED ORDER — OXYCODONE-ACETAMINOPHEN 5-325 MG PO TABS: 2.0000 | ORAL_TABLET | ORAL | Status: DC | PRN

## 2014-06-12 MED ORDER — FENTANYL 2.5 MCG/ML BUPIVACAINE 1/10 % EPIDURAL INFUSION (WH - ANES)
14.0000 mL/h | INTRAMUSCULAR | Status: DC | PRN
  Administered 2014-06-12 – 2014-06-13 (×5): 14 mL/h via EPIDURAL
  Filled 2014-06-12 (×5): qty 125

## 2014-06-12 MED ORDER — LACTATED RINGERS IV SOLN: 500.0000 mL | INTRAVENOUS | Status: DC | PRN

## 2014-06-12 MED ORDER — PHENYLEPHRINE 40 MCG/ML (10ML) SYRINGE FOR IV PUSH (FOR BLOOD PRESSURE SUPPORT)
80.0000 ug | PREFILLED_SYRINGE | INTRAVENOUS | Status: DC | PRN
  Filled 2014-06-12: qty 2

## 2014-06-12 NOTE — MAU Note (Signed)
srom 0700, clear fluid. Small amt of bleeding after exam and membranes stripped yesterday. No problems with preg. irreg ctx's- not as strong as they had been

## 2014-06-12 NOTE — Progress Notes (Signed)
   [name] [name] is a 28 y.o. G3P0020 at [redacted]w[redacted]d  admitted for rupture of membranes  Subjective: Comfortable with epidural  Objective: Filed Vitals:   06/12/14 1931 06/12/14 2001 06/12/14 2031 06/12/14 2102  BP: 111/74 111/72 117/75 124/75  Pulse: 74 71 83 78  Temp:   98 F (36.7 C)   TempSrc:   Oral   Resp: Height:      Weight:      SpO2:          FHT:  FHR: 135 bpm, variability: moderate,  accelerations:  Present,  decelerations:  Absent UC:   irregular, every 3-5 minutes  IUPC placed SVE:   Dilation: 3.5 Effacement (%): 90 Station: -2 Exam by:: Drenda Freeze CNM Pitocin @ 10 mu/min  Labs: Lab Results  Component Value Date   WBC 7.7 06/12/2014   HGB 9.6* 06/12/2014   HCT 31.0* 06/12/2014   MCV 75.1* 06/12/2014   PLT 142* 06/12/2014    Assessment / Plan: PROM, early labor.  Increase pitocin until adequate labor  Labor: slow Fetal Wellbeing:  Category I Pain Control:  Epidural Anticipated MOD:  NSVD  CRESENZO-DISHMAN,Adalay Azucena 06/12/2014, 9:16 PM

## 2014-06-12 NOTE — Anesthesia Preprocedure Evaluation (Signed)

## 2014-06-12 NOTE — H&P (Signed)
[name] [name] is a 28 y.o. female G67P0020 with IUP at [redacted]w[redacted]d presenting for ROM. She was originally feeling contractions quite frequently, but received a dose of nubain which has made them very infrequent. She has felt 3 large gushes of clear fluid and feels she still has some ocasional trickling. Denies vaginal bleeding. Good fetal movement.  [facility] at [facility] since 8.5wks EDD 06/12/14 by LMP, c/w 13.0w U/S  Prenatal History/Complications: GBS positive   Past Medical History: Past Medical History  Diagnosis Date  . Headache(784.0)   . Infection     UTI  . Gonorrhea   . Vaginal Pap smear, abnormal     f/u ok    Past Surgical History: Past Surgical History  Procedure Laterality Date  . No past surgeries      Obstetrical History: OB History   Grav Para Term Preterm Abortions TAB SAB Ect Mult Living   0     Social History: History   Social History  . Marital Status: Single    Spouse Name: N/A    Number of Children: N/A  . Years of Education: N/A   Social History Main Topics  . Smoking status: Former Games developer  . Smokeless tobacco: Never Used     Comment: not every day smoker, prior to the preg  . Alcohol Use: No  . Drug Use: No  . Sexual Activity: Yes    Birth Control/ Protection: None   Other Topics Concern  . None   Social History Narrative  . None    Family History: Family History  Problem Relation Age of Onset  . Hearing loss Neg Hx     Allergies: No Known Allergies  No prescriptions prior to admission     Review of Systems   Constitutional: No fever, chills, or fatigue   Blood pressure 116/87, pulse 98, resp. rate 18, height 5' (1.524 m), weight 75.297 kg (166 lb), last menstrual period 09/05/2013, SpO2 98.00%. General appearance: alert, cooperative and no distress Lungs: clear to auscultation bilaterally Heart: regular rate and rhythm Abdomen: soft, non-tender; bowel sounds normal Pelvic: adequate Extremities: Homans sign is negative, no  sign of DVT DTR's 1+ Presentation: unsure Fetal monitoringBaseline: 135 bpm, Variability: Good {> 6 bpm), Accelerations: Reactive and Decelerations: Absent Uterine activityFrequency: Every 4-8 minutes Dilation: 2 Effacement (%): 80 Station: -2 Exam by:: jolynn   Prenatal labs: ABO, Rh: B/POS/-- (01/08 1150) Antibody: NEG (01/08 1150) Rubella:  immune  RPR: NON REAC (06/17 1353)  HBsAg: NEGATIVE (01/08 1150)  HIV: NONREACTIVE (06/17 1353)  GBS:   Positive  1 hr Glucola 137 3hr GTT: normal; 65/145/150/133 Genetic screening  Normal  Anatomy US: wnl but incomplete and with choroid plexus cysts, no other markers for aneuploidy   Prenatal Transfer Tool  Maternal Diabetes: No Genetic Screening: Normal Maternal Ultrasounds/Referrals: Normal Fetal Ultrasounds or other Referrals:  None Maternal Substance Abuse:  No Significant Maternal Medications:  None Significant Maternal Lab Results: Lab values include: Group B Strep positive     Results for orders placed during the hospital encounter of 06/12/14 (from the past 24 hour(s))  CBC   Collection Time    06/12/14  8:17 AM      Result Value Ref Range   WBC 7.7  4.0 - 10.5 K/uL   RBC 4.13  3.87 - 5.11 MIL/uL   Hemoglobin 9.6 (*) 12.0 - 15.0 g/dL   HCT 16.1 (*) 09.6 - 04.5 %   MCV 75.1 (*)  78.0 - 100.0 fL   MCH 23.2 (*) 26.0 - 34.0 pg   MCHC 31.0  30.0 - 36.0 g/dL   RDW 96.0 (*) 45.4 - 09.8 %   Platelets 142 (*) 150 - 400 K/uL  POCT FERN TEST   Collection Time    06/12/14  8:27 AM      Result Value Ref Range   POCT Fern Test Positive = ruptured amniotic membanes    Results for orders placed in visit on 06/11/14 (from the past 24 hour(s))  POCT URINALYSIS DIP (DEVICE)   Collection Time    06/11/14  9:44 AM      Result Value Ref Range   Glucose, UA NEGATIVE  NEGATIVE mg/dL   Bilirubin Urine NEGATIVE  NEGATIVE   Ketones, ur NEGATIVE  NEGATIVE mg/dL   Specific Gravity, Urine 1.020  1.005 - 1.030   Hgb urine dipstick  NEGATIVE  NEGATIVE   pH 6.5  5.0 - 8.0   Protein, ur NEGATIVE  NEGATIVE mg/dL   Urobilinogen, UA 1.0  0.0 - 1.0 mg/dL   Nitrite NEGATIVE  NEGATIVE   Leukocytes, UA NEGATIVE  NEGATIVE    Assessment: [name] [name] is a 28 y.o. G3P0020 at [redacted]w[redacted]d by LMP, c/w 13.0w U/S presenting with ROM #Labor: Progressing normally, may proceed with augmentation pending progression  #Pain: IV medication, will want an epidural  #FWB: Cat 1 #ID:  GBS positive, PCN #MOF: Breast/bottle #MOC: Unsure currently, information given  #Circ:  Inpatient vs outpatient, pending cost (mom has a combination of Medicaid and BCBS)  Leonides Schanz, Crystal S 06/12/2014, 8:34 AM  I was present for the exam and agree with above.  [place], CNM 06/12/2014 12:15 PM

## 2014-06-12 NOTE — Progress Notes (Signed)
[name] [name] is a 28 y.o. G3P0020 at [redacted]w[redacted]d.  Subjective: Mild discomfort w/ UC's since epidural.  Objective: BP 116/67  Pulse 78  Temp(Src) 98.1 F (36.7 C) (Oral)  Resp 18  Ht 5' (1.524 m)  Wt 75.297 kg (166 lb)  BMI 32.42 kg/m2  SpO2 100%  LMP 09/05/2013   FHT:  FHR: 130 bpm, variability: mod,  accelerations:  15x15,  decelerations: none UC:   Q 2-4 minutes, moderate  Dilation: 3 Effacement (%): 70 Cervical Position: Posterior Station: -2 Presentation: Vertex Exam by:: V Eva Vallee CNM  Labs: Results for orders placed during the hospital encounter of 06/12/14 (from the past 24 hour(s))  CBC     Status: Abnormal   Collection Time    06/12/14  8:17 AM      Result Value Ref Range   WBC 7.7  4.0 - 10.5 K/uL   RBC 4.13  3.87 - 5.11 MIL/uL   Hemoglobin 9.6 (*) 12.0 - 15.0 g/dL   HCT 16.1 (*) 09.6 - 04.5 %   MCV 75.1 (*) 78.0 - 100.0 fL   MCH 23.2 (*) 26.0 - 34.0 pg   MCHC 31.0  30.0 - 36.0 g/dL   RDW 40.9 (*) 81.1 - 91.4 %   Platelets 142 (*) 150 - 400 K/uL  RPR     Status: None   Collection Time    06/12/14  8:17 AM      Result Value Ref Range   RPR NON REAC  NON REAC  TYPE AND SCREEN     Status: None   Collection Time    06/12/14  8:17 AM      Result Value Ref Range   ABO/RH(D) B POS     Antibody Screen NEG     Sample Expiration 06/15/2014    POCT FERN TEST     Status: None   Collection Time    06/12/14  8:27 AM      Result Value Ref Range   POCT Fern Test Positive = ruptured amniotic membanes      Assessment / Plan: [redacted]w[redacted]d week IUP Labor: early Fetal Wellbeing:  Category I Pain Control:  Epidural Anticipated MOD:  NSVD. May need IUPC if no change at next check  [name], CNM 06/12/2014 8:09 PM

## 2014-06-12 NOTE — Progress Notes (Signed)
[name] [name] is a 28 y.o. G3P0020 at [redacted]w[redacted]d.  Subjective: Mild-mod irreg contractions. Coping well w/ Nubain.   Objective: BP 111/66  Pulse 84  Resp 20  Ht 5' (1.524 m)  Wt 75.297 kg (166 lb)  BMI 32.42 kg/m2  SpO2 97%  LMP 09/05/2013      FHT:  FHR: 120 bpm, variability: moderate,  accelerations:  Present,  decelerations:  Absent UC:   irregular, every 5-7 minutes, mild-mod SVE:   Dilation: 3 Effacement (%): 70 Station: -3 Exam by:: v Lakeesha Fontanilla cnm AROM fore bag, moderate amount of clear fluid.  Labs: Lab Results  Component Value Date   WBC 7.7 06/12/2014   HGB 9.6* 06/12/2014   HCT 31.0* 06/12/2014   MCV 75.1* 06/12/2014   PLT 142* 06/12/2014    Assessment / Plan: Protracted latent phase  Labor: Progressing slowly. Fetal Wellbeing:  Category I Pain Control:  Nubain Anticipated MOD:  NSVD Start pitocin  Darcie Mellone 06/12/2014, 2:41 PM

## 2014-06-12 NOTE — Anesthesia Procedure Notes (Signed)
Epidural Patient location during procedure: OB Start time: 06/12/2014 4:27 PM  Staffing Anesthesiologist: Brayton Caves Performed by: anesthesiologist   Preanesthetic Checklist Completed: patient identified, site marked, surgical consent, pre-op evaluation, timeout performed, IV checked, risks and benefits discussed and monitors and equipment checked  Epidural Patient position: sitting Prep: site prepped and draped and DuraPrep Patient monitoring: continuous pulse ox and blood pressure Approach: midline Location: L3-L4 Injection technique: LOR air  Needle:  Needle type: Tuohy  Needle gauge: 17 G Needle length: 9 cm and 9 Needle insertion depth: 5 cm cm Catheter type: closed end flexible Catheter size: 19 Gauge Catheter at skin depth: 10 cm Test dose: negative  Assessment Events: blood not aspirated, injection not painful, no injection resistance, negative IV test and no paresthesia  Additional Notes Patient identified.  Risk benefits discussed including failed block, incomplete pain control, headache, nerve damage, paralysis, blood pressure changes, nausea, vomiting, reactions to medication both toxic or allergic, and postpartum back pain.  Patient expressed understanding and wished to proceed.  All questions were answered.  Sterile technique used throughout procedure and epidural site dressed with sterile barrier dressing. No paresthesia or other complications noted.The patient did not experience any signs of intravascular injection such as tinnitus or metallic taste in mouth nor signs of intrathecal spread such as rapid motor block. Please see nursing notes for vital signs.

## 2014-06-13 ENCOUNTER — Encounter (HOSPITAL_COMMUNITY): Payer: Self-pay | Admitting: *Deleted

## 2014-06-13 MED ORDER — IBUPROFEN 600 MG PO TABS
600.0000 mg | ORAL_TABLET | Freq: Four times a day (QID) | ORAL | Status: DC | PRN
  Administered 2014-06-14 – 2014-06-15 (×9): 600 mg via ORAL
  Filled 2014-06-13 (×9): qty 1

## 2014-06-13 MED ORDER — SODIUM BICARBONATE 8.4 % IV SOLN
INTRAVENOUS | Status: DC | PRN
  Administered 2014-06-13: 10 mL via EPIDURAL

## 2014-06-13 MED ORDER — LACTATED RINGERS IV SOLN
INTRAVENOUS | Status: DC
  Administered 2014-06-13: 17:00:00 via INTRAUTERINE

## 2014-06-13 MED ORDER — BUPIVACAINE HCL (PF) 0.5 % IJ SOLN
30.0000 mL | Freq: Once | INTRAMUSCULAR | Status: AC
  Administered 2014-06-13: 30 mL

## 2014-06-13 NOTE — Progress Notes (Signed)
[name] [name] is a 28 y.o. G3P0020 at [redacted]w[redacted]d by LMP admitted for rupture of membranes  Subjective:   Objective: BP 118/73  Pulse 78  Temp(Src) 98.2 F (36.8 C) (Oral)  Resp 20  Ht 5' (1.524 m)  Wt 75.297 kg (166 lb)  BMI 32.42 kg/m2  SpO2 100%  LMP 09/05/2013 I/O last 3 completed shifts: In: -  Out: 1700 [Urine:1700]    FHT:  FHR: 150-170 bpm, variability: minimal-moderate,  accelerations:  Present,  decelerations:  Present recurrent variable and occassional late UC:   regular, every 2-3 minutes SVE:   Dilation: 10 Effacement (%): 100 Station: +1 Exam by:: Dr. Karie Mainland  Labs: Lab Results  Component Value Date   WBC 7.7 06/12/2014   HGB 9.6* 06/12/2014   HCT 31.0* 06/12/2014   MCV 75.1* 06/12/2014   PLT 142* 06/12/2014    Assessment / Plan: Augmentation of labor, progressing well  Labor: Progressing normally Preeclampsia:  no signs or symptoms of toxicity Fetal Wellbeing:  Category II, pt repositioned to left lateral decubitus position with improvement in tracing. Will start amnioinfusion to assess for improvement in tracing. Pain Control:  Epidural I/D:  PCN Anticipated MOD:  NSVD  Christianne Borrow A 06/13/2014, 5:31 PM

## 2014-06-13 NOTE — Progress Notes (Signed)
   Terrah Decoster is a 28 y.o. G3P0020 at [redacted]w[redacted]d  admitted for rupture of membranes  Subjective: Comfortable with epidural  Objective: Filed Vitals:   06/13/14 0501 06/13/14 0531 06/13/14 0601 06/13/14 0631  BP: 119/75 118/72 124/75 115/72  Pulse: 86 89 93 84  Temp:    98.8 F (37.1 C)  TempSrc:    Oral  Resp: Height:      Weight:      SpO2:       Total I/O In: -  Out: 1100 [Urine:1100]  FHT:  FHR: 150 bpm, variability: moderate,  accelerations:  Present,  decelerations:  Absent UC:   regular, every 2 minutes  4 IUPC's would not seem to pick up contractions, so now pt is on EFM.  Ctx palpate moderate to strong SVE:   Dilation: 5 Effacement (%): 100 Station: 0 Exam by:: Drenda Freeze CNM Pitocin @ 20 mu/min  Labs: Lab Results  Component Value Date   WBC 7.7 06/12/2014   HGB 9.6* 06/12/2014   HCT 31.0* 06/12/2014   MCV 75.1* 06/12/2014   PLT 142* 06/12/2014    Assessment / Plan: Induction of labor due to postterm,  progressing well on pitocin  Labor: Progressing normally Fetal Wellbeing:  Category I Pain Control:  Epidural Anticipated MOD:  NSVD  CRESENZO-DISHMAN,Isamar Nazir 06/13/2014, 6:55 AM

## 2014-06-13 NOTE — Progress Notes (Signed)
[name] [name] is a 28 y.o. G3P0020 at 105w1d   Subjective: Comfortable with epidural  Objective: BP 122/73  Pulse 82  Temp(Src) 99.9 F (37.7 C) (Oral)  Resp 18  Ht 5' (1.524 m)  Wt 75.297 kg (166 lb)  BMI 32.42 kg/m2  SpO2 100%  LMP 09/05/2013 I/O last 3 completed shifts: In: -  Out: 1700 [Urine:1700]    FHT:  FHR: 145-150 bpm, variability: moderate,  accelerations:  Present,  decelerations:  Absent UC:   regular, every 2-3 minutes with Pitocin @ 10mu/min SVE:   Dilation: 5 Effacement (%): 100 Station: -1 Exam by:: Camelia Eng, RN- IUPC inserted again  Labs: Lab Results  Component Value Date   WBC 7.7 06/12/2014   HGB 9.6* 06/12/2014   HCT 31.0* 06/12/2014   MCV 75.1* 06/12/2014   PLT 142* 06/12/2014    Assessment / Plan: IUP @ 40.1wks PROM Latent phase/early active with probable inadequate ctx  Will continue to increase Pitocin to achieve adequate labor After flushing, this IUPC appears to be working- will continue to monitor  Cam Hai CNM 06/13/2014, 10:52 AM

## 2014-06-13 NOTE — Progress Notes (Signed)
   Sherie Dobrowolski is a 28 y.o. G3P0020 at [redacted]w[redacted]d  admitted for rupture of membranes  Subjective: Uncomfortable with some contractions   Objective: Filed Vitals:   06/13/14 0131 06/13/14 0201 06/13/14 0231 06/13/14 0249  BP: 120/73 123/75 115/66   Pulse: 105 94 91   Temp:  100 F (37.8 C)  100.1 F (37.8 C)  TempSrc:  Axillary  Axillary  Resp: 18 18    Height:      Weight:      SpO2:       Total I/O In: -  Out: 1100 [Urine:1100]  FHT:  FHR: 145 bpm, variability: moderate,  accelerations:  Present,  decelerations:  Absent UC:   irregular, every 3-5 minutes SVE:   Dilation: 4 Effacement (%): 90 Station: -2 Exam by:: AL Rinehart RN Pitocin @ 16 mu/min  Labs: Lab Results  Component Value Date   WBC 7.7 06/12/2014   HGB 9.6* 06/12/2014   HCT 31.0* 06/12/2014   MCV 75.1* 06/12/2014   PLT 142* 06/12/2014    Assessment / Plan: PROM, slow, inadequate laboar IUPC replaced; will increase pitocin until adequate labor; keep an eye on oral temp (99.1 now); epidural bolus Labor: early Fetal Wellbeing:  Category I Pain Control:  Epidural Anticipated MOD:  NSVD  CRESENZO-DISHMAN,Essica Kiker 06/13/2014, 3:00 AM

## 2014-06-14 ENCOUNTER — Encounter (HOSPITAL_COMMUNITY): Payer: Self-pay | Admitting: Obstetrics

## 2014-06-14 LAB — CBC
HCT: 27.7 % — ABNORMAL LOW (ref 36.0–46.0)
HEMOGLOBIN: 8.6 g/dL — AB (ref 12.0–15.0)
MCH: 23.1 pg — ABNORMAL LOW (ref 26.0–34.0)
MCHC: 31 g/dL (ref 30.0–36.0)
MCV: 74.5 fL — ABNORMAL LOW (ref 78.0–100.0)
PLATELETS: 123 10*3/uL — AB (ref 150–400)
RBC: 3.72 MIL/uL — ABNORMAL LOW (ref 3.87–5.11)
RDW: 15.5 % (ref 11.5–15.5)
WBC: 18.8 10*3/uL — AB (ref 4.0–10.5)

## 2014-06-14 MED ORDER — PRENATAL MULTIVITAMIN CH
1.0000 | ORAL_TABLET | Freq: Every day | ORAL | Status: DC
  Administered 2014-06-14 – 2014-06-16 (×3): 1 via ORAL
  Filled 2014-06-14 (×3): qty 1

## 2014-06-14 MED ORDER — SENNOSIDES-DOCUSATE SODIUM 8.6-50 MG PO TABS
2.0000 | ORAL_TABLET | ORAL | Status: DC
  Administered 2014-06-14 – 2014-06-15 (×3): 2 via ORAL
  Filled 2014-06-14 (×3): qty 2

## 2014-06-14 MED ORDER — SIMETHICONE 80 MG PO CHEW
80.0000 mg | CHEWABLE_TABLET | ORAL | Status: DC | PRN
Start: 2014-06-14 — End: 2014-06-16

## 2014-06-14 MED ORDER — LANOLIN HYDROUS EX OINT: TOPICAL_OINTMENT | CUTANEOUS | Status: DC | PRN

## 2014-06-14 MED ORDER — INFLUENZA VAC SPLIT QUAD 0.5 ML IM SUSY
0.5000 mL | PREFILLED_SYRINGE | INTRAMUSCULAR | Status: AC
Start: 2014-06-15 — End: 2014-06-15
  Administered 2014-06-15: 0.5 mL via INTRAMUSCULAR
  Filled 2014-06-14: qty 0.5

## 2014-06-14 MED ORDER — BENZOCAINE-MENTHOL 20-0.5 % EX AERO
1.0000 "application " | INHALATION_SPRAY | CUTANEOUS | Status: DC | PRN
  Administered 2014-06-14 – 2014-06-15 (×2): 1 via TOPICAL
  Filled 2014-06-14 (×2): qty 56

## 2014-06-14 MED ORDER — ZOLPIDEM TARTRATE 5 MG PO TABS: 5.0000 mg | ORAL_TABLET | Freq: Every evening | ORAL | Status: DC | PRN

## 2014-06-14 MED ORDER — DIPHENHYDRAMINE HCL 25 MG PO CAPS
25.0000 mg | ORAL_CAPSULE | Freq: Four times a day (QID) | ORAL | Status: DC | PRN
Start: 2014-06-14 — End: 2014-06-16

## 2014-06-14 MED ORDER — TETANUS-DIPHTH-ACELL PERTUSSIS 5-2.5-18.5 LF-MCG/0.5 IM SUSP: 0.5000 mL | Freq: Once | INTRAMUSCULAR | Status: DC

## 2014-06-14 MED ORDER — DIBUCAINE 1 % RE OINT: 1.0000 "application " | TOPICAL_OINTMENT | RECTAL | Status: DC | PRN

## 2014-06-14 MED ORDER — ONDANSETRON HCL 4 MG/2ML IJ SOLN: 4.0000 mg | INTRAMUSCULAR | Status: DC | PRN

## 2014-06-14 MED ORDER — ONDANSETRON HCL 4 MG PO TABS: 4.0000 mg | ORAL_TABLET | ORAL | Status: DC | PRN

## 2014-06-14 MED ORDER — WITCH HAZEL-GLYCERIN EX PADS
1.0000 | MEDICATED_PAD | CUTANEOUS | Status: DC | PRN
Start: 2014-06-14 — End: 2014-06-16

## 2014-06-14 NOTE — Anesthesia Postprocedure Evaluation (Signed)
  Anesthesia Post-op Note  Patient: [name] [name]  Procedure(s) Performed: * No procedures listed *  Patient Location: [facility]  Anesthesia Type:Epidural  Level of Consciousness: awake, alert  and oriented  Airway and Oxygen Therapy: Patient Spontanous Breathing  Post-op Pain: none  Post-op Assessment: Post-op Vital signs reviewed, Patient's Cardiovascular Status Stable, Respiratory Function Stable, Patent Airway, No signs of Nausea or vomiting, Pain level controlled and No headache  Post-op Vital Signs: Reviewed and stable  Last Vitals:  Filed Vitals:   06/14/14 1743  BP: 110/49  Pulse: 65  Temp: 36.5 C  Resp: 17    Complications: No apparent anesthesia complications, delayed return of right leg function (motor/sensory) however on exam patient with resolving motor and sensory block, denies HA, no back pain with palpation or movement

## 2014-06-14 NOTE — Lactation Note (Addendum)
This note was copied from the chart of [name] [name]. Lactation Consultation Note  Patient Name: [name] [name] [id number] Date: 06/14/2014 Reason for consult: Initial assessment NICU baby 14 hours of life. Mom's first time using DEBP, mom pumping when LC entered rooms. Reviewed DEBP instructions. Mom return-demonstrated hand expression after pumping with a few drops collected and sent to NICU. Enc mom to massage breast and hand express prior to pump, use DEBP every 3 hours for 15 minutes, then hand express after pumping and send or carry EBM to NICU. Reviewed NICU booklet. Mom aware of OP/BFSG and LC phone line for after discharge. Mom states that she has BCBS and Medicaid, so enc her to contact insurance company and then [facility] about DEBP.   Maternal Data Has patient been taught Hand Expression?: Yes Does the patient have breastfeeding experience prior to this delivery?: No  Feeding    LATCH Score/Interventions                      Lactation Tools Discussed/Used Pump Review: Setup, frequency, and cleaning;Milk Storage Initiated by:: JW Date initiated:: 06/15/14   Consult Status Consult Status: Follow-up Date: 06/15/14 Follow-up type: In-patient    [name] [name] 06/14/2014, 1:03 PM

## 2014-06-14 NOTE — Progress Notes (Signed)
Post Partum Day #1 Subjective: no complaints, up ad lib and tolerating PO; infant in NICU; wants to pump/breastfeed; Mirena for contraception  Objective: Blood pressure 119/76, pulse 63, temperature 97.8 F (36.6 C), temperature source Oral, resp. rate 16, height 5' (1.524 m), weight 75.297 kg (166 lb), last menstrual period 09/05/2013, SpO2 98.00%, unknown if currently breastfeeding.  Physical Exam:  General: alert, cooperative and no distress Lungs: nl effort Heart: RRR Lochia: appropriate Uterine Fundus: firm DVT Evaluation: No evidence of DVT seen on physical exam.   Recent Labs  06/12/14 0817 06/14/14 0510  HGB 9.6* 8.6*  HCT 31.0* 27.7*    Assessment/Plan: Plan for discharge tomorrow   LOS: 2 days   Cam Hai CNM 06/14/2014, 9:03 AM

## 2014-06-15 MED ORDER — IBUPROFEN 600 MG PO TABS: 600.0000 mg | ORAL_TABLET | Freq: Four times a day (QID) | ORAL | Status: DC | PRN

## 2014-06-15 NOTE — Discharge Instructions (Signed)

## 2014-06-15 NOTE — Discharge Summary (Signed)
Obstetric Discharge Summary Reason for Admission: onset of labor Prenatal Procedures: NST Intrapartum Procedures: vacuum assisted vaginal delivery Postpartum Procedures: none Complications-Intrapartum and Postpartum: 3rd degree perineal laceration and shoulder dystocia  Patient presented in active labor at [redacted]w[redacted]d, but needed VAVD for late decelerations.  Delivery complicated by 1 min shoulder dystocia (resolved with [place] and [place] manuevers) and 3rd degree laceration.  Apgars 1/5/8, arterial cord pH 7.176,  infant admitted to NICU.  Uncomplicated postpartum care. Breastfeeding and desires Mirena for postpartum contraception.  Physical Exam:  BP 102/65  Pulse 77  Temp(Src) 97.9 F (36.6 C) (Oral)  Resp 16  Ht 5' (1.524 m)  Wt 166 lb (75.297 kg)  BMI 32.42 kg/m2  SpO2 97%  LMP 09/05/2013  Breastfeeding? Unknown General: alert and no distress Lochia: appropriate Uterine Fundus: firm DVT Evaluation: No evidence of DVT seen on physical exam. Negative Homan's sign.  CBC Latest Ref Rng 06/14/2014 06/12/2014 03/19/2014  WBC 4.0 - 10.5 K/uL 18.8(H) 7.7 8.0  Hemoglobin 12.0 - 15.0 g/dL 1.6(X) 0.9(U) 0.4(V)  Hematocrit 36.0 - 46.0 % 27.7(L) 31.0(L) 28.7(L)  Platelets 150 - 400 K/uL 123(L) 142(L) 146(L)     Discharge Diagnoses: Term Pregnancy-delivered  Discharge Information: Date: 06/15/2014 Activity: pelvic rest and as per discharge instructions Diet: routine Medications: None and Ibuprofen Condition: stable Instructions: refer to discharge instructions Discharge to: home   Newborn Data: Live born female  Birth Weight: 8 lb 2.7 oz (3705 g) APGAR: 1, 5, 8   Arterial cord pH 7.176 Admitted in NICU at time of maternal discharge.   Tereso Newcomer, MD 06/15/2014, 7:44 AM

## 2014-06-16 ENCOUNTER — Ambulatory Visit: Payer: Self-pay

## 2014-06-16 MED ORDER — POLYETHYLENE GLYCOL 3350 17 G PO PACK: 17.0000 g | PACK | Freq: Every day | ORAL | Status: DC

## 2014-06-16 MED ORDER — OXYCODONE-ACETAMINOPHEN 5-325 MG PO TABS: 1.0000 | ORAL_TABLET | ORAL | Status: DC | PRN

## 2014-06-16 MED ORDER — DOCUSATE SODIUM 100 MG PO CAPS: 100.0000 mg | ORAL_CAPSULE | Freq: Two times a day (BID) | ORAL | Status: DC

## 2014-06-16 NOTE — Progress Notes (Signed)
Ur chart review completed.  
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
## 2014-06-16 NOTE — Progress Notes (Signed)
Pt and her SO verbalize understanding of d/c instructions, medications, follow up appts, when to call the MD, medications/prescriptions, and basic Mother care. No questions at this time. Pt has no IV access. WIC is aware of needed breast pump and has one available for pt today. Pt d/c with information and presrciptions in hand. Pt went to NICU to visit baby prior to leaving. Sheryn Bison

## 2014-06-16 NOTE — Discharge Summary (Signed)
Obstetric Discharge Summary  Reason for Admission: onset of labor  Prenatal Procedures: NST  Intrapartum Procedures: vacuum assisted vaginal delivery  Postpartum Procedures: none  Complications-Intrapartum and Postpartum: 3rd degree perineal laceration and shoulder dystocia  Patient presented in active labor at [redacted]w[redacted]d, but needed VAVD for late decelerations. Delivery complicated by 1 min shoulder dystocia (resolved with McRoberts and [place] manuevers) and 3rd degree laceration. Apgars 1/5/8, arterial cord pH 7.176, infant admitted to NICU. Uncomplicated postpartum care. Breastfeeding and desires Mirena for postpartum contraception.   Physical Exam:  Filed Vitals:   06/15/14 1117 06/15/14 1800 06/15/14 2122 06/16/14 0517  BP: 105/66 121/79 143/85 130/86  Pulse: 87 81 71 62  Temp: 98.4 F (36.9 C) 97.9 F (36.6 C) 98.2 F (36.8 C) 98.2 F (36.8 C)  TempSrc: Oral Oral Oral Oral  Resp: Height:      Weight:      SpO2: 99% 100% 100% 97%   General: alert and no distress  Lochia: appropriate  Uterine Fundus: firm  DVT Evaluation: No evidence of DVT seen on physical exam.  Negative Homan's sign.  CBC  Latest Ref Rng  06/14/2014  06/12/2014  03/19/2014   WBC  4.0 - 10.5 K/uL  18.8(H)  7.7  8.0   Hemoglobin  12.0 - 15.0 g/dL  1.6(X)  0.9(U)  0.4(V)   Hematocrit  36.0 - 46.0 %  27.7(L)  31.0(L)  28.7(L)   Platelets  150 - 400 K/uL  123(L)  142(L)  146(L)    Discharge Diagnoses: Term Pregnancy-delivered  Discharge Information:  Date: 06/16/2014  Activity: pelvic rest and as per discharge instructions  Diet: routine  Medications: Percocet, Ibuprofen, Colace, PNV Condition: stable  Instructions: refer to discharge instructions  Discharge to: home   Newborn Data:  Live born female  Birth Weight: 8 lb 2.7 oz (3705 g)  APGAR: 1, 5, 8 Arterial cord pH 7.176  Admitted in NICU at time of maternal discharge.  I was present for the exam and agree with above.  [place],  CNM 06/16/2014 10:54 AM

## 2014-06-16 NOTE — Lactation Note (Signed)
This note was copied from the chart of [name]. Lactation Consultation Note     Follow up consult with this mom of a NICU baby, now 60 hours post partum, and baby is term. Mom is being discharged to home today. In finding out how pumping was going for her, sne told me she had not pumped since yesterday. I reviewed  yhe importacne of supply and demand with mom. On exam, her breast were full, and milk was easy to express. i had mom pump with DEP, and she expressed 1-2 mls, which she was going to bring to the baby. Mom is active with [facility]IC, so I faxed her info to them, and Rexene Edison, peer couseloher baby's nurse in NICU.  Patient Name: [name] [name] [id number] Date: 06/16/2014     Maternal Data    Feeding    LATCH Score/Interventions                      Lactation Tools Discussed/Used     Consult Status      Alfred Levins 06/16/2014, 4:01 PM

## 2014-06-16 NOTE — Discharge Summary (Signed)
Attestation of Attending Supervision of Advanced Practitioner (PA/CNM/NP): Evaluation and management procedures were performed by the Advanced Practitioner under my supervision and collaboration.  I have reviewed the Advanced Practitioner's note and chart, and I agree with the management and plan.  Jacob Stinson, DO Attending Physician Faculty Practice, [facility]  

## 2014-06-17 ENCOUNTER — Encounter: Payer: Medicaid Other | Admitting: Obstetrics & Gynecology

## 2014-07-23 ENCOUNTER — Ambulatory Visit (INDEPENDENT_AMBULATORY_CARE_PROVIDER_SITE_OTHER): Payer: BC Managed Care – PPO | Admitting: Obstetrics & Gynecology

## 2014-07-23 ENCOUNTER — Encounter: Payer: Self-pay | Admitting: Obstetrics & Gynecology
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
## 2014-08-04 ENCOUNTER — Encounter: Payer: Self-pay | Admitting: Obstetrics & Gynecology

## 2014-09-03 ENCOUNTER — Ambulatory Visit: Payer: BC Managed Care – PPO | Admitting: Family Medicine

## 2014-10-20 ENCOUNTER — Ambulatory Visit (INDEPENDENT_AMBULATORY_CARE_PROVIDER_SITE_OTHER): Payer: BLUE CROSS/BLUE SHIELD | Admitting: Family Medicine

## 2014-10-20 VITALS — BP 108/78 | HR 76 | Temp 98.4°F | Resp 20 | Ht 60.5 in | Wt 136.5 lb

## 2014-10-20 DIAGNOSIS — H209 Unspecified iridocyclitis: Secondary | ICD-10-CM

## 2014-10-20 DIAGNOSIS — H5711 Ocular pain, right eye: Secondary | ICD-10-CM

## 2014-10-20 MED ORDER — TOBRAMYCIN 0.3 % OP SOLN: 1.0000 [drp] | OPHTHALMIC | Status: DC

## 2014-10-20 NOTE — Patient Instructions (Signed)
Iritis Iritis is an inflammation of the colored part of the eye (iris). Other parts at the front of the eye may also be inflamed. The iris is part of the middle layer of the eyeball which is called the uvea or the uveal track. Any part of the uveal track can become inflamed. The other portions of the uveal track are the choroid (the thin membrane under the outer layer of the eye), and the ciliary body (joins the choroid and the iris and produces the fluid in the front of the eye).  It is extremely important to treat iritis early, as it may lead to internal eye damage causing scarring or diseases such as glaucoma. Some people have only one attack of iritis (in one or both eyes) in their lifetime, while others may get it many times. CAUSES Iritis can be associated with many different diseases, but mostly occurs in otherwise healthy people. Examples of diseases that can be associated with iritis include:  Diseases where the body's immune system attacks tissues within your own body (autoimmune diseases).  Infections (tuberculosis, gonorrhea, fungus infections, Lyme disease, infection of the lining of the heart).  Trauma or injury.  Eye diseases (acute glaucoma and others).  Inflammation from other parts of the uveal track.  Severe eye infections.  Other rare diseases. SYMPTOMS  Eye pain or aching.  Sensitivity to light.  Loss of sight or blurred vision.  Redness of the eye. This is often accompanied by a ring of redness around the outside of the cornea, or clear covering at the front of the eye (ciliary flush).  Excessive tearing of the eye(s).  A small pupil that does not enlarge in the dark and stays smaller than the other eye's pupil.  A whitish area that obscures the lower part of the colored circular iris. Sometimes this is visible when looking at the eye, where the whitish area has a "fluid level" or flat top. This is called a "hypopyon" and is actually pus inside the eye. Since  iritis causes the eye to become red, it is often confused with a much less dangerous form of "pink eye" or conjunctivitis. One of the most important symptoms is sensitivity to light. Anytime there is redness, discomfort in the eye(s) and extreme light sensitivity, it is extremely important to see an ophthalmologist as soon as possible. TREATMENT Acute iritis requires prompt medical evaluation by an eye specialist (ophthalmologist.) Treatment depends on the underlying cause but may include:  Corticosteroid eye drops and dilating eye drops. Follow your caregiver's exact instructions on taking and stopping corticosteroid medications (drops or pills).  Occasionally, the iritis will be so severe that it will not respond to commonly used medications. If this happens, it may be necessary to use steroid injections. The injections are given under the eye's outer surface. Sometimes oral medications are given. The decision on treatment used for iritis is usually made on an individual basis. HOME CARE INSTRUCTIONS Your care giver will give specific instructions regarding the use of eye medications or other medications. Be certain to follow all instructions in both taking and stopping the medications. SEEK IMMEDIATE MEDICAL CARE IF:  You have redness of one or both eye.  You experience a great deal of light sensitivity.  You have pain or aching in either eye. MAKE SURE YOU:   Understand these instructions.  Will watch your condition.  Will get help right away if you are not doing well or get worse. Document Released: 09/19/2005 Document Revised: 12/12/2011 Document   Reviewed: 03/09/2007 ExitCare Patient Information 2015 [employer]. This information is not intended to replace advice given to you by your health care provider. Make sure you discuss any questions you have with your health care provider.  
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
## 2014-12-26 ENCOUNTER — Ambulatory Visit (INDEPENDENT_AMBULATORY_CARE_PROVIDER_SITE_OTHER): Payer: BLUE CROSS/BLUE SHIELD | Admitting: Family Medicine

## 2014-12-26 VITALS — BP 122/80 | HR 136 | Temp 98.5°F | Resp 16 | Ht 60.5 in | Wt 139.0 lb

## 2014-12-26 DIAGNOSIS — A084 Viral intestinal infection, unspecified: Secondary | ICD-10-CM | POA: Diagnosis not present

## 2014-12-26 DIAGNOSIS — R112 Nausea with vomiting, unspecified: Secondary | ICD-10-CM | POA: Diagnosis not present

## 2014-12-26 DIAGNOSIS — R1030 Lower abdominal pain, unspecified: Secondary | ICD-10-CM | POA: Diagnosis not present

## 2014-12-26 DIAGNOSIS — R6889 Other general symptoms and signs: Secondary | ICD-10-CM

## 2014-12-26 LAB — POCT URINALYSIS DIPSTICK
BILIRUBIN UA: NEGATIVE
Glucose, UA: NEGATIVE
KETONES UA: NEGATIVE
LEUKOCYTES UA: NEGATIVE
Nitrite, UA: NEGATIVE
Protein, UA: 30
RBC UA: NEGATIVE
Urobilinogen, UA: 0.2
pH, UA: 5

## 2014-12-26 LAB — POCT INFLUENZA A/B
Influenza A, POC: NEGATIVE
Influenza B, POC: NEGATIVE

## 2014-12-26 LAB — POCT CBC
GRANULOCYTE PERCENT: 89.7 % — AB (ref 37–80)
HCT, POC: 43.3 % (ref 37.7–47.9)
Hemoglobin: 13.9 g/dL (ref 12.2–16.2)
Lymph, poc: 0.6 (ref 0.6–3.4)
MCH, POC: 27.1 pg (ref 27–31.2)
MCHC: 32 g/dL (ref 31.8–35.4)
MCV: 84.8 fL (ref 80–97)
MID (cbc): 0.3 (ref 0–0.9)
MPV: 7.5 fL (ref 0–99.8)
POC Granulocyte: 8.3 — AB (ref 2–6.9)
POC LYMPH PERCENT: 6.6 %L — AB (ref 10–50)
POC MID %: 3.7 %M (ref 0–12)
Platelet Count, POC: 208 10*3/uL (ref 142–424)
RBC: 5.11 M/uL (ref 4.04–5.48)
RDW, POC: 14.3 %
WBC: 9.3 10*3/uL (ref 4.6–10.2)

## 2014-12-26 LAB — POCT UA - MICROSCOPIC ONLY
BACTERIA, U MICROSCOPIC: NEGATIVE
Casts, Ur, LPF, POC: NEGATIVE
Crystals, Ur, HPF, POC: NEGATIVE
Mucus, UA: NEGATIVE
RBC, URINE, MICROSCOPIC: NEGATIVE
Yeast, UA: NEGATIVE

## 2014-12-26 MED ORDER — ONDANSETRON 4 MG PO TBDP: 4.0000 mg | ORAL_TABLET | Freq: Three times a day (TID) | ORAL | Status: DC | PRN

## 2014-12-26 MED ORDER — ACETAMINOPHEN 325 MG PO TABS
1000.0000 mg | ORAL_TABLET | Freq: Once | ORAL | Status: AC
  Administered 2014-12-26: 975 mg via ORAL

## 2014-12-26 MED ORDER — ONDANSETRON 4 MG PO TBDP
4.0000 mg | ORAL_TABLET | Freq: Once | ORAL | Status: AC
  Administered 2014-12-26: 4 mg via ORAL

## 2014-12-26 NOTE — Patient Instructions (Addendum)
Take the medicine for nausea every 6 or 8 hours if needed  Take over-the-counter Imodium as directed on the container if needed for bad diarrhea. Do not take too much, because often people will end up getting themselves constipated from it.  Worse abdominal pain or vomiting or passing of blood or anything else acutely worse go to the emergency room or return here  The most important thing is for you to try and get in plenty of liquids. While the nausea medicine is in your system please try and drink a lot. You can tell if you're getting enough liquids if you are urinating regularly.  Good handwashing to avoid sharing this with others  Norovirus Infection Norovirus illness is caused by a viral infection. The term norovirus refers to a group of viruses. Any of those viruses can cause norovirus illness. This illness is often referred to by other names such as viral gastroenteritis, stomach flu, and food poisoning. Anyone can get a norovirus infection. People can have the illness multiple times during their lifetime. CAUSES  Norovirus is found in the stool or vomit of infected people. It is easily spread from person to person (contagious). People with norovirus are contagious from the moment they begin feeling ill. They may remain contagious for as long as 3 days to 2 weeks after recovery. People can become infected with the virus in several ways. This includes:  Eating food or drinking liquids that are contaminated with norovirus.  Touching surfaces or objects contaminated with norovirus, and then placing your hand in your mouth.  Having direct contact with a person who is infected and shows symptoms. This may occur while caring for someone with illness or while sharing foods or eating utensils with someone who is ill. SYMPTOMS  Symptoms usually begin 1 to 2 days after ingestion of the virus. Symptoms may include:  Nausea.  Vomiting.  Diarrhea.  Stomach cramps.  Low-grade  fever.  Chills.  Headache.  Muscle aches.  Tiredness. Most people with norovirus illness get better within 1 to 2 days. Some people become dehydrated because they cannot drink enough liquids to replace those lost from vomiting and diarrhea. This is especially true for young children, the elderly, and others who are unable to care for themselves. DIAGNOSIS  Diagnosis is based on your symptoms and exam. Currently, only state public health laboratories have the ability to test for norovirus in stool or vomit. TREATMENT  No specific treatment exists for norovirus infections. No vaccine is available to prevent infections. Norovirus illness is usually brief in healthy people. If you are ill with vomiting and diarrhea, you should drink enough water and fluids to keep your urine clear or pale yellow. Dehydration is the most serious health effect that can result from this infection. By drinking oral rehydration solution (ORS), people can reduce their chance of becoming dehydrated. There are many commercially available pre-made and powdered ORS designed to safely rehydrate people. These may be recommended by your caregiver. Replace any new fluid losses from diarrhea or vomiting with ORS as follows:  If your child weighs 10 kg or less (22 lb or less), give 60 to 120 ml ( to  cup or 2 to 4 oz) of ORS for each diarrheal stool or vomiting episode.  If your child weighs more than 10 kg (more than 22 lb), give 120 to 240 ml ( to 1 cup or 4 to 8 oz) of ORS for each diarrheal stool or vomiting episode. HOME CARE INSTRUCTIONS  Follow all your caregiver's instructions.  Avoid sugar-free and alcoholic drinks while ill.  Only take over-the-counter or prescription medicines for pain, vomiting, diarrhea, or fever as directed by your caregiver. You can decrease your chances of coming in contact with norovirus or spreading it by following these steps:  Frequently wash your hands, especially after using the  toilet, changing diapers, and before eating or preparing food.  Carefully wash fruits and vegetables. Cook shellfish before eating them.  Do not prepare food for others while you are infected and for at least 3 days after recovering from illness.  Thoroughly clean and disinfect contaminated surfaces immediately after an episode of illness using a bleach-based household cleaner.  Immediately remove and wash clothing or linens that may be contaminated with the virus.  Use the toilet to dispose of any vomit or stool. Make sure the surrounding area is kept clean.  Food that may have been contaminated by an ill person should be discarded. SEEK IMMEDIATE MEDICAL CARE IF:   You develop symptoms of dehydration that do not improve with fluid replacement. This may include:  Excessive sleepiness.  Lack of tears.  Dry mouth.  Dizziness when standing.  Weak pulse. Document Released: 12/10/2002 Document Revised: 12/12/2011 Document Reviewed: 01/11/2010 [facility] Patient Information 2015 [employer] [place]. This information is not intended to replace advice given to you by your health care provider. Make sure you discuss any questions you have with your health care provider.

## 2014-12-26 NOTE — Progress Notes (Signed)
Subjective: 29 year old lady who's been sick for several days with a viral-like illness. Today she's been vomiting and having diarrhea. Actually the vomiting started last night. She has abdominal pain, generalized, primarily across the upper abdomen. No blood in the vomit or diarrhea. She has felt achy. She's having chills.  Objective: Repeat temperature over 100. Her TMs are normal. Throat clear. Neck supple without nodes. Chest clear to auscultation. Heart regular, tachycardia at a little over 100. Abdomen has bowel sounds present. Soft without masses or specific tenderness although she has a little generalized discomfort.   Results for orders placed or performed in visit on 12/26/14  POCT CBC  Result Value Ref Range   WBC 9.3 4.6 - 10.2 K/uL   Lymph, poc 0.6 0.6 - 3.4   POC LYMPH PERCENT 6.6 (A) 10 - 50 %L   MID (cbc) 0.3 0 - 0.9   POC MID % 3.7 0 - 12 %M   POC Granulocyte 8.3 (A) 2 - 6.9   Granulocyte percent 89.7 (A) 37 - 80 %G   RBC 5.11 4.04 - 5.48 M/uL   Hemoglobin 13.9 12.2 - 16.2 g/dL   HCT, POC 16.143.3 09.637.7 - 47.9 %   MCV 84.8 80 - 97 fL   MCH, POC 27.1 27 - 31.2 pg   MCHC 32.0 31.8 - 35.4 g/dL   RDW, POC 04.514.3 %   Platelet Count, POC 208 142 - 424 K/uL   MPV 7.5 0 - 99.8 fL  POCT Influenza A/B  Result Value Ref Range   Influenza A, POC Negative    Influenza B, POC Negative   POCT urinalysis dipstick  Result Value Ref Range   Color, UA dark yellow    Clarity, UA clear    Glucose, UA neg    Bilirubin, UA neg    Ketones, UA neg    Spec Grav, UA >=1.030    Blood, UA neg    pH, UA 5.0    Protein, UA 30    Urobilinogen, UA 0.2    Nitrite, UA neg    Leukocytes, UA Negative   POCT UA - Microscopic Only  Result Value Ref Range   WBC, Ur, HPF, POC 0-3    RBC, urine, microscopic neg    Bacteria, U Microscopic neg    Mucus, UA neg    Epithelial cells, urine per micros 2-10    Crystals, Ur, HPF, POC neg    Casts, Ur, LPF, POC neg    Yeast, UA neg   '  assessment: Gastroenteritis, probably norovirus Viral syndrome Vomiting Diarrhea Abdominal pain Dehydration  Plan: Treat her symptomatically. Push fluids. Return if in all worse or go to the emergency room if necessary.

## 2015-03-05 ENCOUNTER — Emergency Department (HOSPITAL_COMMUNITY)
Admission: EM | Admit: 2015-03-05 | Discharge: 2015-03-05 | Disposition: A | Discharge status: Home / Self Care | Payer: BLUE CROSS/BLUE SHIELD | Source: Home / Self Care

## 2015-03-05 ENCOUNTER — Encounter (HOSPITAL_COMMUNITY): Payer: Self-pay | Admitting: Family Medicine

## 2015-03-05 DIAGNOSIS — J069 Acute upper respiratory infection, unspecified: Secondary | ICD-10-CM | POA: Diagnosis not present

## 2015-03-05 DIAGNOSIS — L03113 Cellulitis of right upper limb: Secondary | ICD-10-CM

## 2015-03-05 MED ORDER — IPRATROPIUM BROMIDE 0.06 % NA SOLN: 2.0000 | Freq: Four times a day (QID) | NASAL | Status: DC

## 2015-03-05 MED ORDER — FLUCONAZOLE 150 MG PO TABS: 150.0000 mg | ORAL_TABLET | Freq: Every day | ORAL | Status: DC

## 2015-03-05 MED ORDER — FLUTICASONE PROPIONATE 50 MCG/ACT NA SUSP: 2.0000 | Freq: Every day | NASAL | Status: DC

## 2015-03-05 MED ORDER — CEPHALEXIN 500 MG PO CAPS: 500.0000 mg | ORAL_CAPSULE | Freq: Two times a day (BID) | ORAL | Status: DC

## 2015-03-05 NOTE — ED Provider Notes (Signed)
CSN: [id number]     Arrival date & time 03/05/15  1524 History   None    Chief Complaint  Patient presents with  . URI  . Insect Bite   (Consider location/radiation/quality/duration/timing/severity/associated sxs/prior Treatment) HPI  4 days ago patient was stung by wasp on her right arm. This area was initially treated with aloe vera. (To improve but then 2 days ago developed severe red painful rash around the area this is now growing. Symptoms are constant and getting worse. Denies fevers, nausea, vomiting, headache, neck stiffness.  Of note patient also with three-day history of runny nose cough congestion and sinus pain and pressure. Denies fevers, shortness of breath, wheezing, productive cough. Has not tried anything for the symptoms. Son with similar symptoms.   Past Medical History  Diagnosis Date  . Headache(784.0)   . Infection     UTI  . Gonorrhea   . Vaginal Pap smear, abnormal     f/u ok  . Shoulder dystocia, delivered, current hospitalization 06/14/2014   Past Surgical History  Procedure Laterality Date  . No past surgeries     Family History  Problem Relation Age of Onset  . Hearing loss Neg Hx    History  Substance Use Topics  . Smoking status: Former Games developermoker  . Smokeless tobacco: Never Used     Comment: not every day smoker, prior to the preg  . Alcohol Use: No   OB History    Gravida Para Term Preterm AB TAB SAB Ectopic Multiple Living   3 1 1  2  2   1      Review of Systems Per HPI with all other pertinent systems negative.   Allergies  Review of patient's allergies indicates no known allergies.  Home Medications   Prior to Admission medications   Medication Sig Start Date End Date Taking? Authorizing Provider  cephALEXin (KEFLEX) 500 MG capsule Take 1 capsule (500 mg total) by mouth 2 (two) times daily. 03/05/15   Ozella Rocksavid J Merrell, MD  fluconazole (DIFLUCAN) 150 MG tablet Take 1 tablet (150 mg total) by mouth daily. Repeat dose in 3 days 03/05/15    Ozella Rocksavid J Merrell, MD  fluticasone [facility](FLONASE) 50 MCG/ACT nasal spray Place 2 sprays into both nostrils at bedtime. 03/05/15   Ozella Rocksavid J Merrell, MD  ipratropium (ATROVENT) 0.06 % nasal spray Place 2 sprays into both nostrils 4 (four) times daily. 03/05/15   Ozella Rocksavid J Merrell, MD   BP 124/87 mmHg  Pulse 88  Temp(Src) 98.6 F (37 C) (Oral)  Resp 24  SpO2 99%  LMP 03/03/2015  Breastfeeding? No Physical Exam Physical Exam  Constitutional: oriented to person, place, and time. appears well-developed and well-nourished. No distress.  HENT: Rhinorrhea  Head: Normocephalic and atraumatic.  Eyes: EOMI. PERRL.  Neck: Normal range of motion.  Cardiovascular: RRR, no m/r/g, 2+ distal pulses,  Pulmonary/Chest: Effort normal and breath sounds normal. No respiratory distress.  Abdominal: Soft. Bowel sounds are normal. NonTTP, no distension.  Musculoskeletal: Normal range of motion. Non ttp, no effusion.  Neurological: alert and oriented to person, place, and time.  Skin: 3 x 4 erythematous macular tender rash with central punctated lesion from wasp sting. Located on right forearm.  Psychiatric: normal mood and affect. behavior is normal. Judgment and thought content normal.   ED Course  Procedures (including critical care time) Labs Review Labs Reviewed - No data to display  Imaging Review No results found.   MDM   1. URI (upper respiratory infection)  2. Cellulitis of arm, right    Keflex Nasal Atrovent, Flonase, Zyrtec, ibuprofen. Diflucan if develops yeast infection.    Ozella Rocks, MD 03/05/15 [phone]

## 2015-03-05 NOTE — ED Notes (Signed)
Reports being stung by a wasp on memorial day  On the right elbow, having severe irritation.     Pt is also c/o  Sneezing,throbbing, cough, stuffy nose.  Denies fever, n/v/d.     Symptoms present since Tuesday.   No otc meds tried.

## 2015-03-05 NOTE — Discharge Instructions (Signed)
You have a viral cold. Please start the nasal Atrovent, Flonase, Zyrtec, and ibuprofen. Consider also using Sudafed to help with your symptoms. This should resolve in another 1-3 days. Please start the Keflex for the cellulitis of your right arm. This should begin to resolve within 24 hours. Use the Diflucan if you develop a yeast infection.

## 2015-07-12 ENCOUNTER — Emergency Department (HOSPITAL_COMMUNITY): Payer: BLUE CROSS/BLUE SHIELD

## 2015-07-12 ENCOUNTER — Emergency Department (HOSPITAL_COMMUNITY)
Admission: EM | Admit: 2015-07-12 | Discharge: 2015-07-12 | Disposition: A | Discharge status: Home / Self Care | Payer: BLUE CROSS/BLUE SHIELD | Attending: Emergency Medicine | Admitting: Emergency Medicine

## 2015-07-12 ENCOUNTER — Encounter (HOSPITAL_COMMUNITY): Payer: Self-pay | Admitting: Emergency Medicine

## 2015-07-12 DIAGNOSIS — Z87891 Personal history of nicotine dependence: Secondary | ICD-10-CM | POA: Insufficient documentation

## 2015-07-12 DIAGNOSIS — R0602 Shortness of breath: Secondary | ICD-10-CM | POA: Diagnosis present

## 2015-07-12 DIAGNOSIS — Z8619 Personal history of other infectious and parasitic diseases: Secondary | ICD-10-CM | POA: Insufficient documentation

## 2015-07-12 DIAGNOSIS — Z87440 Personal history of urinary (tract) infections: Secondary | ICD-10-CM | POA: Insufficient documentation

## 2015-07-12 DIAGNOSIS — J069 Acute upper respiratory infection, unspecified: Secondary | ICD-10-CM | POA: Diagnosis not present

## 2015-07-12 MED ORDER — SALINE SPRAY 0.65 % NA SOLN: 1.0000 | NASAL | Status: DC | PRN

## 2015-07-12 MED ORDER — LORATADINE 10 MG PO TABS: 10.0000 mg | ORAL_TABLET | Freq: Every day | ORAL | Status: DC

## 2015-07-12 MED ORDER — ALBUTEROL SULFATE HFA 108 (90 BASE) MCG/ACT IN AERS
2.0000 | INHALATION_SPRAY | RESPIRATORY_TRACT | Status: DC | PRN
Start: 2015-07-12 — End: 2015-07-12
  Filled 2015-07-12: qty 6.7

## 2015-07-12 NOTE — ED Notes (Signed)
Pt c/o shob that started today, cough started couple days ago.  Pt has productive cough that is yellow.  Pt states that her husband gave her some medication this morning and "i dont know name of it bc my husband gave it to me but it made me go to sleep".

## 2015-07-12 NOTE — Discharge Instructions (Signed)
Upper Respiratory Infection, Adult Most upper respiratory infections (URIs) are a viral infection of the air passages leading to the lungs. A URI affects the nose, throat, and upper air passages. The most common type of URI is nasopharyngitis and is typically referred to as "the common cold." URIs run their course and usually go away on their own. Most of the time, a URI does not require medical attention, but sometimes a bacterial infection in the upper airways can follow a viral infection. This is called a secondary infection. Sinus and middle ear infections are common types of secondary upper respiratory infections. Bacterial pneumonia can also complicate a URI. A URI can worsen asthma and chronic obstructive pulmonary disease (COPD). Sometimes, these complications can require emergency medical care and may be life threatening.  CAUSES Almost all URIs are caused by viruses. A virus is a type of germ and can spread from one person to another.  RISKS FACTORS You may be at risk for a URI if:   You smoke.   You have chronic heart or lung disease.  You have a weakened defense (immune) system.   You are very young or very old.   You have nasal allergies or asthma.  You work in crowded or poorly ventilated areas.  You work in health care facilities or schools. SIGNS AND SYMPTOMS  Symptoms typically develop 2-3 days after you come in contact with a cold virus. Most viral URIs last 7-10 days. However, viral URIs from the influenza virus (flu virus) can last 14-18 days and are typically more severe. Symptoms may include:   Runny or stuffy (congested) nose.   Sneezing.   Cough.   Sore throat.   Headache.   Fatigue.   Fever.   Loss of appetite.   Pain in your forehead, behind your eyes, and over your cheekbones (sinus pain).  Muscle aches.  DIAGNOSIS  Your health care provider may diagnose a URI by:  Physical exam.  Tests to check that your symptoms are not due to  another condition such as:  Strep throat.  Sinusitis.  Pneumonia.  Asthma. TREATMENT  A URI goes away on its own with time. It cannot be cured with medicines, but medicines may be prescribed or recommended to relieve symptoms. Medicines may help:  Reduce your fever.  Reduce your cough.  Relieve nasal congestion. HOME CARE INSTRUCTIONS   Take medicines only as directed by your health care provider.   Gargle warm saltwater or take cough drops to comfort your throat as directed by your health care provider.  Use a warm mist humidifier or inhale steam from a shower to increase air moisture. This may make it easier to breathe.  Drink enough fluid to keep your urine clear or pale yellow.   Eat soups and other clear broths and maintain good nutrition.   Rest as needed.   Return to work when your temperature has returned to normal or as your health care provider advises. You may need to stay home longer to avoid infecting others. You can also use a face mask and careful hand washing to prevent spread of the virus.  Increase the usage of your inhaler if you have asthma.   Do not use any tobacco products, including cigarettes, chewing tobacco, or electronic cigarettes. If you need help quitting, ask your health care provider. PREVENTION  The best way to protect yourself from getting a cold is to practice good hygiene.   Avoid oral or hand contact with people with cold   symptoms.   Wash your hands often if contact occurs.  There is no clear evidence that vitamin C, vitamin E, echinacea, or exercise reduces the chance of developing a cold. However, it is always recommended to get plenty of rest, exercise, and practice good nutrition.  SEEK MEDICAL CARE IF:   You are getting worse rather than better.   Your symptoms are not controlled by medicine.   You have chills.  You have worsening shortness of breath.  You have brown or red mucus.  You have yellow or brown nasal  discharge.  You have pain in your face, especially when you bend forward.  You have a fever.  You have swollen neck glands.  You have pain while swallowing.  You have white areas in the back of your throat. SEEK IMMEDIATE MEDICAL CARE IF:   You have severe or persistent:  Headache.  Ear pain.  Sinus pain.  Chest pain.  You have chronic lung disease and any of the following:  Wheezing.  Prolonged cough.  Coughing up blood.  A change in your usual mucus.  You have a stiff neck.  You have changes in your:  Vision.  Hearing.  Thinking.  Mood. MAKE SURE YOU:   Understand these instructions.  Will watch your condition.  Will get help right away if you are not doing well or get worse.   This information is not intended to replace advice given to you by your health care provider. Make sure you discuss any questions you have with your health care provider.   Document Released: 03/15/2001 Document Revised: 02/03/2015 Document Reviewed: 12/25/2013 [employer] Interactive Patient Education 2016 [employer]  

## 2015-07-12 NOTE — ED Provider Notes (Signed)
CSN: [id number]     Arrival date & time 07/12/15  1415 History   First MD Initiated Contact with Patient 07/12/15 1508     Chief Complaint  Patient presents with  . Shortness of Breath  . Cough     (Consider location/radiation/quality/duration/timing/severity/associated sxs/prior Treatment) HPI Comments: Patient presents to the ED with a chief complaint of cough and SOB.  She states that her symptoms started on Friday.  She states that she started with a "runny nose and sore throat."  States that then she got a cough, and this morning she had cough and SOB.  She reports generalized body aches, but denies any fevers, chills, nausea, or vomiting.  She has taken OTC cough medicine with some relief.  She denies any history of ACS.  No history of PE or DVT, no recent surgery, no hemoptysis, no exogenous estrogen use, no unilateral leg swelling, no recent long travel.   The history is provided by the patient. No language interpreter was used.    Past Medical History  Diagnosis Date  . Headache(784.0)   . Infection     UTI  . Gonorrhea   . Vaginal Pap smear, abnormal     f/u ok  . Shoulder dystocia, delivered, current hospitalization 06/14/2014   Past Surgical History  Procedure Laterality Date  . No past surgeries     Family History  Problem Relation Age of Onset  . Hearing loss Neg Hx    Social History  Substance Use Topics  . Smoking status: Former Games developer  . Smokeless tobacco: Never Used     Comment: not every day smoker, prior to the preg  . Alcohol Use: No   OB History    Gravida Para Term Preterm AB TAB SAB Ectopic Multiple Living   Review of Systems  Constitutional: Negative for fever and chills.  HENT: Positive for postnasal drip, rhinorrhea, sinus pressure, sneezing and sore throat.   Respiratory: Positive for cough and shortness of breath.   Cardiovascular: Negative for chest pain.  Gastrointestinal: Negative for nausea, vomiting, abdominal  pain, diarrhea and constipation.  Genitourinary: Negative for dysuria.  All other systems reviewed and are negative.     Allergies  Review of patient's allergies indicates no known allergies.  Home Medications   Prior to Admission medications   Medication Sig Start Date End Date Taking? Authorizing Provider  cephALEXin (KEFLEX) 500 MG capsule Take 1 capsule (500 mg total) by mouth 2 (two) times daily. Patient not taking: Reported on 07/12/2015 03/05/15   Ozella Rocks, MD  fluconazole (DIFLUCAN) 150 MG tablet Take 1 tablet (150 mg total) by mouth daily. Repeat dose in 3 days Patient not taking: Reported on 07/12/2015 03/05/15   Ozella Rocks, MD  fluticasone [facility]) 50 MCG/ACT nasal spray Place 2 sprays into both nostrils at bedtime. Patient not taking: Reported on 07/12/2015 03/05/15   Ozella Rocks, MD  ipratropium (ATROVENT) 0.06 % nasal spray Place 2 sprays into both nostrils 4 (four) times daily. Patient not taking: Reported on 07/12/2015 03/05/15   Ozella Rocks, MD   BP 121/76 mmHg  Pulse 87  Temp(Src) 98.2 F (36.8 C) (Oral)  Resp 20  SpO2 100%  LMP 06/12/2015 Physical Exam  Constitutional: She appears well-developed and well-nourished. No distress.  HENT:  Head: Normocephalic.  Right Ear: External ear normal.  Left Ear: External ear normal.  Mildly erythematous, no tonsillar exudate,  no abscess, no stridor, uvula is midline  TMs clear bilaterally  Eyes: Conjunctivae and EOM are normal. Pupils are equal, round, and reactive to light.  Neck: Normal range of motion. Neck supple.  Cardiovascular: Normal rate, regular rhythm and normal heart sounds.  Exam reveals no gallop and no friction rub.   No murmur heard. Pulmonary/Chest: Effort normal and breath sounds normal. No stridor. No respiratory distress. She has no wheezes. She has no rales. She exhibits no tenderness.  CTAB  Abdominal: Soft. Bowel sounds are normal. She exhibits no distension. There is no tenderness.   Musculoskeletal: Normal range of motion. She exhibits no tenderness.  Neurological: She is alert.  Skin: Skin is warm and dry. No rash noted. She is not diaphoretic.  Psychiatric: She has a normal mood and affect. Her behavior is normal. Judgment and thought content normal.  Nursing note and vitals reviewed.   ED Course  Procedures (including critical care time)  Imaging Review Dg Chest 2 View  07/12/2015   CLINICAL DATA:  Shortness of breath.  EXAM: CHEST  2 VIEW  COMPARISON:  January 21, 2010.  FINDINGS: The heart size and mediastinal contours are within normal limits. Both lungs are clear. No pneumothorax or pleural effusion is noted. The visualized skeletal structures are unremarkable.  IMPRESSION: No acute cardiopulmonary abnormality seen.   Electronically Signed   By: Lupita Raider, M.D.   On: 07/12/2015 15:13   I have personally reviewed and evaluated these images and lab results as part of my medical decision-making.   MDM   Final diagnoses:  URI (upper respiratory infection)    Pt CXR negative for acute infiltrate. Patients symptoms are consistent with URI, could be influenza, but with reassuring vitals and no other comordities will opt for outpatient treatment.  Lungs are clear.  No wheezing or stridor.  PERC negative, doubt PE. VSS. Discussed that antibiotics are not indicated for viral infections. Pt will be discharged with symptomatic treatment.  Verbalizes understanding and is agreeable with plan. Pt is hemodynamically stable & in NAD prior to dc.     Roxy Horseman, PA-C 07/12/15 1547  Alvira Monday, MD 07/14/15 1743

## 2016-01-28 LAB — OB RESULTS CONSOLE RUBELLA ANTIBODY, IGM: RUBELLA: IMMUNE

## 2016-01-28 LAB — OB RESULTS CONSOLE ABO/RH: RH TYPE: POSITIVE

## 2016-01-28 LAB — OB RESULTS CONSOLE HIV ANTIBODY (ROUTINE TESTING): HIV: NONREACTIVE

## 2016-01-28 LAB — OB RESULTS CONSOLE RPR: RPR: NONREACTIVE

## 2016-01-28 LAB — OB RESULTS CONSOLE HEPATITIS B SURFACE ANTIGEN: HEP B S AG: NEGATIVE

## 2016-01-28 LAB — OB RESULTS CONSOLE ANTIBODY SCREEN: ANTIBODY SCREEN: NEGATIVE

## 2016-01-28 LAB — OB RESULTS CONSOLE GC/CHLAMYDIA
Chlamydia: NEGATIVE
GC PROBE AMP, GENITAL: NEGATIVE

## 2016-02-02 ENCOUNTER — Encounter (HOSPITAL_COMMUNITY): Payer: Self-pay | Admitting: Emergency Medicine

## 2016-02-02 ENCOUNTER — Emergency Department (HOSPITAL_COMMUNITY)
Admission: EM | Admit: 2016-02-02 | Discharge: 2016-02-02 | Disposition: A | Discharge status: Home / Self Care | Payer: BLUE CROSS/BLUE SHIELD | Attending: Emergency Medicine | Admitting: Emergency Medicine

## 2016-02-02 DIAGNOSIS — R52 Pain, unspecified: Secondary | ICD-10-CM | POA: Diagnosis not present

## 2016-02-02 DIAGNOSIS — Z87891 Personal history of nicotine dependence: Secondary | ICD-10-CM | POA: Diagnosis not present

## 2016-02-02 DIAGNOSIS — Z79899 Other long term (current) drug therapy: Secondary | ICD-10-CM | POA: Diagnosis not present

## 2016-02-02 DIAGNOSIS — Z7951 Long term (current) use of inhaled steroids: Secondary | ICD-10-CM | POA: Diagnosis not present

## 2016-02-02 DIAGNOSIS — Z7952 Long term (current) use of systemic steroids: Secondary | ICD-10-CM | POA: Diagnosis not present

## 2016-02-02 LAB — CBC WITH DIFFERENTIAL/PLATELET
Basophils Absolute: 0 10*3/uL (ref 0.0–0.1)
Basophils Relative: 0 %
Eosinophils Absolute: 0.2 10*3/uL (ref 0.0–0.7)
Eosinophils Relative: 1 %
HCT: 35.8 % — ABNORMAL LOW (ref 36.0–46.0)
Hemoglobin: 12.3 g/dL (ref 12.0–15.0)
Lymphocytes Relative: 11 %
Lymphs Abs: 1.5 10*3/uL (ref 0.7–4.0)
MCH: 29.9 pg (ref 26.0–34.0)
MCHC: 34.4 g/dL (ref 30.0–36.0)
MCV: 87.1 fL (ref 78.0–100.0)
Monocytes Absolute: 0.9 10*3/uL (ref 0.1–1.0)
Monocytes Relative: 7 %
Neutro Abs: 10.6 10*3/uL — ABNORMAL HIGH (ref 1.7–7.7)
Neutrophils Relative %: 81 %
Platelets: 156 10*3/uL (ref 150–400)
RBC: 4.11 MIL/uL (ref 3.87–5.11)
RDW: 13 % (ref 11.5–15.5)
WBC: 13.2 10*3/uL — ABNORMAL HIGH (ref 4.0–10.5)

## 2016-02-02 LAB — BASIC METABOLIC PANEL
Anion gap: 8 (ref 5–15)
BUN: 6 mg/dL (ref 6–20)
CO2: 21 mmol/L — ABNORMAL LOW (ref 22–32)
Calcium: 9.2 mg/dL (ref 8.9–10.3)
Chloride: 107 mmol/L (ref 101–111)
Creatinine, Ser: 0.47 mg/dL (ref 0.44–1.00)
GFR calc Af Amer: >60 mL/min (ref >60)
GFR calc non Af Amer: >60 mL/min (ref >60)
Glucose, Bld: 89 mg/dL (ref 65–99)
Potassium: 3.6 mmol/L (ref 3.5–5.1)
Sodium: 136 mmol/L (ref 135–145)

## 2016-02-02 NOTE — ED Provider Notes (Signed)
CSN: [id number]     Arrival date & time 02/02/16  0243 History   First MD Initiated Contact with Patient 02/02/16 0454     Chief Complaint  Patient presents with  . Generalized Body Aches    HPI   30 year old female presents today with complaints of body aches. Patient reports that for the last 2 weeks she's had generalized body aches. She describes this as a "deep pain" that is worse with ambulation. She notes this is diffuse and not localized. Patient reports that today she had a minor sore throat, with rhinorrhea and some congestion. Patient reports a history of influenza several years ago, she reports the body aches feels similar, but she reports that this does not feel overall similar to influenza. She denies any recent fevers, chills, nausea, vomiting, abdominal pain, vaginal bleeding or discharge. Patient reports that she is 4 months pregnant with no complications from the pregnancy. Patient notes symptoms are identical to previous pregnancy, she notes that she had difficulty with ambulation and generalized pain later in the pregnancy. She notes this is sooner than she would have expected these symptoms.   Past Medical History  Diagnosis Date  . Headache(784.0)   . Infection     UTI  . Gonorrhea   . Vaginal Pap smear, abnormal     f/u ok  . Shoulder dystocia, delivered, current hospitalization 06/14/2014   Past Surgical History  Procedure Laterality Date  . No past surgeries     Family History  Problem Relation Age of Onset  . Hearing loss Neg Hx    Social History  Substance Use Topics  . Smoking status: Former Games developermoker  . Smokeless tobacco: Never Used     Comment: not every day smoker, prior to the preg  . Alcohol Use: No   OB History    Gravida Para Term Preterm AB TAB SAB Ectopic Multiple Living   4 1 1  2  2   1      Review of Systems  All other systems reviewed and are negative.   Allergies  Review of patient's allergies indicates no known allergies.  Home  Medications   Prior to Admission medications   Medication Sig Start Date End Date Taking? Authorizing Provider  loratadine (CLARITIN) 10 MG tablet Take 1 tablet (10 mg total) by mouth daily. 07/12/15  Yes Roxy Horsemanobert Browning, PA-C  cephALEXin (KEFLEX) 500 MG capsule Take 1 capsule (500 mg total) by mouth 2 (two) times daily. Patient not taking: Reported on 07/12/2015 03/05/15   Ozella Rocksavid J Merrell, MD  fluconazole (DIFLUCAN) 150 MG tablet Take 1 tablet (150 mg total) by mouth daily. Repeat dose in 3 days Patient not taking: Reported on 07/12/2015 03/05/15   Ozella Rocksavid J Merrell, MD  fluticasone [facility](FLONASE) 50 MCG/ACT nasal spray Place 2 sprays into both nostrils at bedtime. Patient not taking: Reported on 07/12/2015 03/05/15   Ozella Rocksavid J Merrell, MD  ipratropium (ATROVENT) 0.06 % nasal spray Place 2 sprays into both nostrils 4 (four) times daily. Patient not taking: Reported on 07/12/2015 03/05/15   Ozella Rocksavid J Merrell, MD  sodium chloride (OCEAN) 0.65 % SOLN nasal spray Place 1 spray into both nostrils as needed for congestion. 07/12/15   Roxy Horsemanobert Browning, PA-C   BP 124/83 mmHg  Pulse 115  Temp(Src) 99 F (37.2 C) (Oral)  Resp 20  Ht 5' (1.524 m)  Wt 61.236 kg  BMI 26.37 kg/m2  SpO2 99%  LMP 06/12/2015   Physical Exam  Constitutional: She is oriented to  person, place, and time. She appears well-developed and well-nourished.  HENT:  Head: Normocephalic and atraumatic.  Right Ear: Hearing and tympanic membrane normal.  Left Ear: Hearing and tympanic membrane normal.  Nose: Nose normal.  Mouth/Throat: Uvula is midline, oropharynx is clear and moist and mucous membranes are normal. No oropharyngeal exudate, posterior oropharyngeal edema, posterior oropharyngeal erythema or tonsillar abscesses.  Eyes: Conjunctivae are normal. Pupils are equal, round, and reactive to light. Right eye exhibits no discharge. Left eye exhibits no discharge. No scleral icterus.  Neck: Normal range of motion. No JVD present. No tracheal  deviation present.  Pulmonary/Chest: Effort normal. No stridor.  Musculoskeletal:  Joints of supple with full active range of motion, no tenderness to palpation of the major muscle compartments  Neurological: She is alert and oriented to person, place, and time. Coordination normal.  Skin: Skin is warm and dry. No rash noted. No erythema. No pallor.  Psychiatric: She has a normal mood and affect. Her behavior is normal. Judgment and thought content normal.  Nursing note and vitals reviewed.   ED Course  Procedures (including critical care time) Labs Review Labs Reviewed - No data to display  Imaging Review No results found. I have personally reviewed and evaluated these images and lab results as part of my medical decision-making.   EKG Interpretation None      MDM   Final diagnoses:  Body aches    Labs: CBC, BMP- WBC 13.2    Imaging:  Consults:   Therapeutics:  Discharge Meds:   Assessment/Plan: 30 year old female presents today with 2 weeks of generalized body pain. She reports is similar to previous pregnancies, though this is slightly earlier than expected. Patient has associated internalized weakness as well. Patient reports the addition of minor sore throat yesterday. Patient was initially afebrile here in the ED, but review of her vitals after discharge note that she had a temperature of 100.4. I discussed with the patient that this could likely be the beginning of a viral illness including influenza. Patient is nontoxic in no acute distress while here in the ED, she is pregnant with no complication of the pregnancy. Patient will need to follow-up with her primary care, or OB GYN tomorrow for reevaluation and further diagnostic testing in the event that this is viral type illness. Patient will be instructed to use Tylenol as needed for pain, she is instructed to return to emergency room if any new or worsening signs or symptoms present. She verbalized her understanding  and agreement to today's plan had no further questions or concerns at the time discharge.        Eyvonne Mechanic, PA-C 02/02/16 2123  Geoffery Lyons, MD 02/03/16 [phone]

## 2016-02-02 NOTE — Discharge Instructions (Signed)
Please follow-up with her primary care provider for reevaluation and further management. If you experience any new or worsening signs or symptoms please return to the emergency room for further evaluation.

## 2016-02-02 NOTE — ED Notes (Signed)
Pt reports being at work tonight and beginning to feel "bad".  Pt reports generalized body aches "severe enough to make her want to cry".  Pt reports being [redacted] weeks pregnant.

## 2016-02-06 ENCOUNTER — Inpatient Hospital Stay (HOSPITAL_COMMUNITY)
Admission: AD | Admit: 2016-02-06 | Discharge: 2016-02-06 | Disposition: A | Discharge status: Home / Self Care | Payer: BLUE CROSS/BLUE SHIELD | Source: Ambulatory Visit | Attending: Obstetrics & Gynecology | Admitting: Obstetrics & Gynecology

## 2016-02-06 ENCOUNTER — Encounter (HOSPITAL_COMMUNITY): Payer: Self-pay | Admitting: *Deleted

## 2016-02-06 DIAGNOSIS — Z3A15 15 weeks gestation of pregnancy: Secondary | ICD-10-CM | POA: Diagnosis not present

## 2016-02-06 DIAGNOSIS — Z87891 Personal history of nicotine dependence: Secondary | ICD-10-CM | POA: Insufficient documentation

## 2016-02-06 DIAGNOSIS — O26892 Other specified pregnancy related conditions, second trimester: Secondary | ICD-10-CM | POA: Diagnosis not present

## 2016-02-06 DIAGNOSIS — B349 Viral infection, unspecified: Secondary | ICD-10-CM

## 2016-02-06 LAB — CBC WITH DIFFERENTIAL/PLATELET
Basophils Absolute: 0 10*3/uL (ref 0.0–0.1)
Basophils Relative: 0 %
Eosinophils Absolute: 0.2 10*3/uL (ref 0.0–0.7)
Eosinophils Relative: 2 %
HCT: 34.2 % — ABNORMAL LOW (ref 36.0–46.0)
HEMOGLOBIN: 12 g/dL (ref 12.0–15.0)
LYMPHS ABS: 1.7 10*3/uL (ref 0.7–4.0)
LYMPHS PCT: 23 %
MCH: 30.1 pg (ref 26.0–34.0)
MCHC: 35.1 g/dL (ref 30.0–36.0)
MCV: 85.7 fL (ref 78.0–100.0)
Monocytes Absolute: 0.3 10*3/uL (ref 0.1–1.0)
Monocytes Relative: 4 %
NEUTROS PCT: 71 %
Neutro Abs: 5.4 10*3/uL (ref 1.7–7.7)
Platelets: 153 10*3/uL (ref 150–400)
RBC: 3.99 MIL/uL (ref 3.87–5.11)
RDW: 12.9 % (ref 11.5–15.5)
WBC: 7.6 10*3/uL (ref 4.0–10.5)

## 2016-02-06 LAB — URINALYSIS, ROUTINE W REFLEX MICROSCOPIC
Bilirubin Urine: NEGATIVE
Glucose, UA: NEGATIVE mg/dL
HGB URINE DIPSTICK: NEGATIVE
Ketones, ur: 15 mg/dL — AB
Leukocytes, UA: NEGATIVE
NITRITE: NEGATIVE
PH: 5.5 (ref 5.0–8.0)
Protein, ur: NEGATIVE mg/dL

## 2016-02-06 LAB — INFLUENZA PANEL BY PCR (TYPE A & B)
H1N1 flu by pcr: NOT DETECTED
Influenza A By PCR: NEGATIVE
Influenza B By PCR: NEGATIVE

## 2016-02-06 MED ORDER — ONDANSETRON HCL 4 MG/2ML IJ SOLN
4.0000 mg | Freq: Once | INTRAMUSCULAR | Status: AC
  Administered 2016-02-06: 4 mg via INTRAVENOUS
  Filled 2016-02-06: qty 2

## 2016-02-06 MED ORDER — ONDANSETRON 4 MG PO TBDP: 4.0000 mg | ORAL_TABLET | Freq: Three times a day (TID) | ORAL | Status: DC | PRN

## 2016-02-06 MED ORDER — PANTOPRAZOLE SODIUM 20 MG PO TBEC: 20.0000 mg | DELAYED_RELEASE_TABLET | Freq: Every day | ORAL | Status: DC

## 2016-02-06 MED ORDER — LACTATED RINGERS IV BOLUS (SEPSIS)
750.0000 mL | Freq: Once | INTRAVENOUS | Status: AC
  Administered 2016-02-06: 750 mL via INTRAVENOUS

## 2016-02-06 MED ORDER — PANTOPRAZOLE SODIUM 40 MG IV SOLR
20.0000 mg | Freq: Once | INTRAVENOUS | Status: AC
  Administered 2016-02-06: 20 mg via INTRAVENOUS
  Filled 2016-02-06: qty 20

## 2016-02-06 MED ORDER — LACTATED RINGERS IV SOLN
INTRAVENOUS | Status: DC
  Administered 2016-02-06: 17:00:00 via INTRAVENOUS

## 2016-02-06 NOTE — Discharge Instructions (Signed)

## 2016-02-06 NOTE — MAU Provider Note (Signed)
History     30 y/o [id number]@G4P1021@ 1715 W 3 D EGA by LMP c/o cough, mid level abdominal cramping and pain when she coughs.  She also has decreased appetite.  She complains of throat pain especially at night when lying down.  [facility] for body aches x 2 weeks, sore throat, rhinorrhea and congestion, and was discharged and advised to use tylenol prn.     Patient Active Problem List   Diagnosis Date Noted  . Shoulder dystocia, delivered, current hospitalization 06/14/2014  . Vacuum extractor delivery 06/12/2014  . Group b streptococcal infection in child of prior pregnancy, currently pregnant in third trimester 06/03/2014  . Carpal tunnel syndrome during pregnancy 04/08/2014  . Supervision of normal subsequent pregnancy 11/05/2013  . History of miscarriage 07/20/2013    Chief Complaint  Patient presents with  . Abdominal Pain  . Cough  . Anorexia   HPI  OB History    Gravida Para Term Preterm AB TAB SAB Ectopic Multiple Living   4 1 1  2  2   1       Past Medical History  Diagnosis Date  . Headache(784.0)   . Infection     UTI  . Gonorrhea   . Vaginal Pap smear, abnormal     f/u ok  . Shoulder dystocia, delivered, current hospitalization 06/14/2014    Past Surgical History  Procedure Laterality Date  . No past surgeries      Family History  Problem Relation Age of Onset  . Hearing loss Neg Hx     Social History  Substance Use Topics  . Smoking status: Former Games developermoker  . Smokeless tobacco: Never Used     Comment: not every day smoker, prior to the preg  . Alcohol Use: No    Allergies: No Known Allergies  No prescriptions prior to admission    Review of Systems  Constitutional: Positive for malaise/fatigue.  HENT: Negative.   Eyes: Negative.   Respiratory: Positive for cough.   Cardiovascular: Negative.   Gastrointestinal: Positive for nausea, vomiting and abdominal pain.  Genitourinary: Negative.   Musculoskeletal: Positive for  joint pain.  Neurological: Positive for weakness.  Endo/Heme/Allergies: Negative.   Psychiatric/Behavioral: Negative.    Physical Exam   Blood pressure 115/70, pulse 96, temperature 98.4 F (36.9 C), temperature source Oral, resp. rate 18, last menstrual period 10/21/2015, SpO2 100 %, not currently breastfeeding. Physical Exam Gen: NAD, Appears tired HEENT: No erythema or exudate in throat. CVS: s1, s2, RRR Pulm: CTAB Abd: Soft, non tender, gravid.  Ext: warm and well perfused. Doppler: Fetal heart tone 160.   CBC    Component Value Date/Time   WBC 7.6 02/06/2016 1630   WBC 9.3 12/26/2014 1319   RBC 3.99 02/06/2016 1630   RBC 5.11 12/26/2014 1319   HGB 12.0 02/06/2016 1630   HGB 13.9 12/26/2014 1319   HCT 34.2* 02/06/2016 1630   HCT 43.3 12/26/2014 1319   PLT 153 02/06/2016 1630   MCV 85.7 02/06/2016 1630   MCV 84.8 12/26/2014 1319   MCH 30.1 02/06/2016 1630   MCH 27.1 12/26/2014 1319   MCHC 35.1 02/06/2016 1630   MCHC 32.0 12/26/2014 1319   RDW 12.9 02/06/2016 1630   LYMPHSABS 1.7 02/06/2016 1630   MONOABS 0.3 02/06/2016 1630   EOSABS 0.2 02/06/2016 1630   BASOSABS 0.0 02/06/2016 1630   Urinalysis    Component Value Date/Time   COLORURINE YELLOW 02/06/2016 1540   APPEARANCEUR HAZY*  02/06/2016 1540   LABSPEC >1.030* 02/06/2016 1540   PHURINE 5.5 02/06/2016 1540   GLUCOSEU NEGATIVE 02/06/2016 1540   HGBUR NEGATIVE 02/06/2016 1540   BILIRUBINUR NEGATIVE 02/06/2016 1540   BILIRUBINUR neg 12/26/2014 1320   KETONESUR 15* 02/06/2016 1540   PROTEINUR NEGATIVE 02/06/2016 1540   PROTEINUR 30 12/26/2014 1320   UROBILINOGEN 0.2 12/26/2014 1320   UROBILINOGEN 1.0 06/11/2014 0944   NITRITE NEGATIVE 02/06/2016 1540   NITRITE neg 12/26/2014 1320   LEUKOCYTESUR NEGATIVE 02/06/2016 1540    Labs from office: 01/28/16: Urine culture negative. GC/chlam: Neg/neg.  CBC significant for WBC 8.6 Hgb 12.9.  Component     Latest Ref Rng 02/06/2016  Influenza A By PCR      NEGATIVE NEGATIVE     ED Course  Labs tests were done significant for: normal WBC, ketones in urine, influenza test negative.  Patient received IV hydration as well as zofran and protonix and felt better.        Assessment: 30 y/o G4P1021 @ 15 w 3 d EGA here with coughing, vomiting and decrease in appetite,likely secondary to cold, viral syndrome  Plan:  -Patient has improved after MAU treatment. -Discharge to home with zofran, protonix.  Advised to use over counter cough medicine, list given also encouraged to use the OB handbook given in office. -Allow time off from work X 3 days -Fever and infection precautions reviewed.  -Advised increased hydration, gatorade use, small portions of bland meals.  -F/u in office in 1 week if still with continued or worsening symptoms.   Konrad Felix MD.  02/06/2016 4:48 PM

## 2016-02-06 NOTE — MAU Note (Signed)
Patient presents with cough x 3 days, [facility] started off with body aches, cough got worse some vomiting, abdominal pain only when coughing. Gets PNC on [facility], no vaginal bleeding no discharge. Now no appetite

## 2016-03-01 IMAGING — US US OB NUCHAL TRANSLUCENCY 1ST GEST
1 series · 13 of 27 positions shown · non-contrast
Comparison: none

[Series 1: us ob nuchal translucency 1st gest · 0.21mm/px · 13 of 27 slices shown]
[im 2/27]
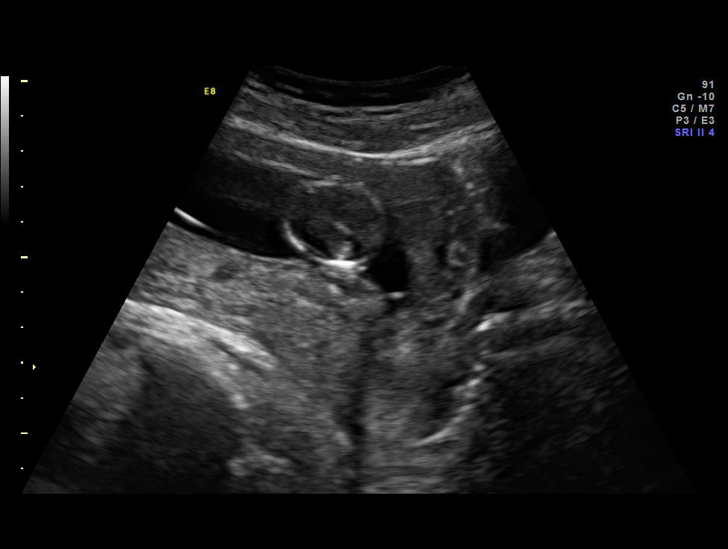
[im 4/27]
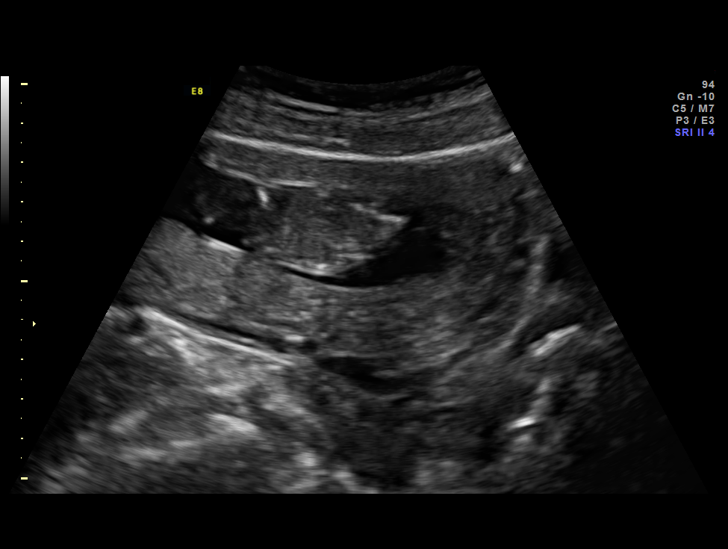
[im 6/27]
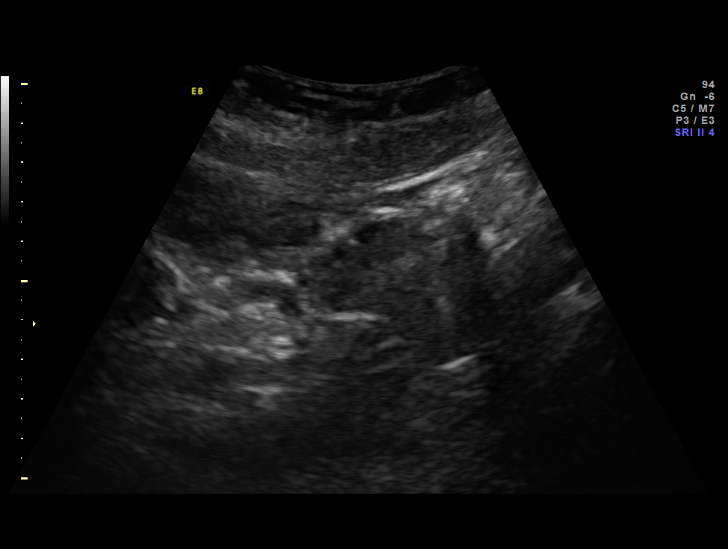
[im 8/27]
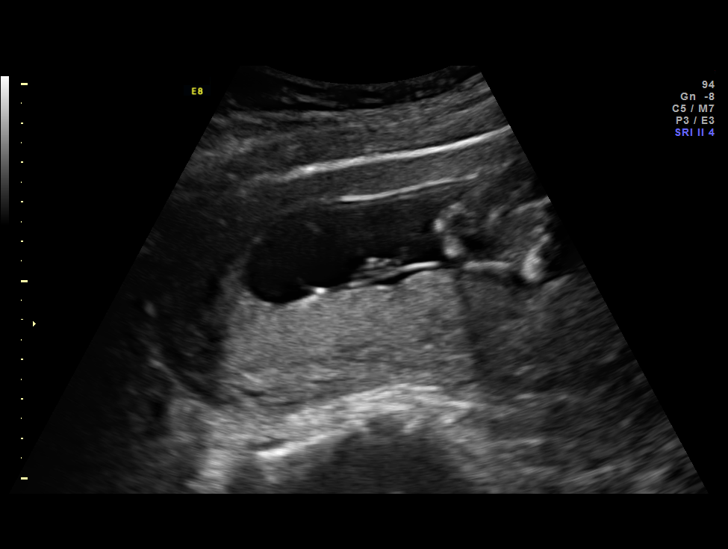
[im 10/27]
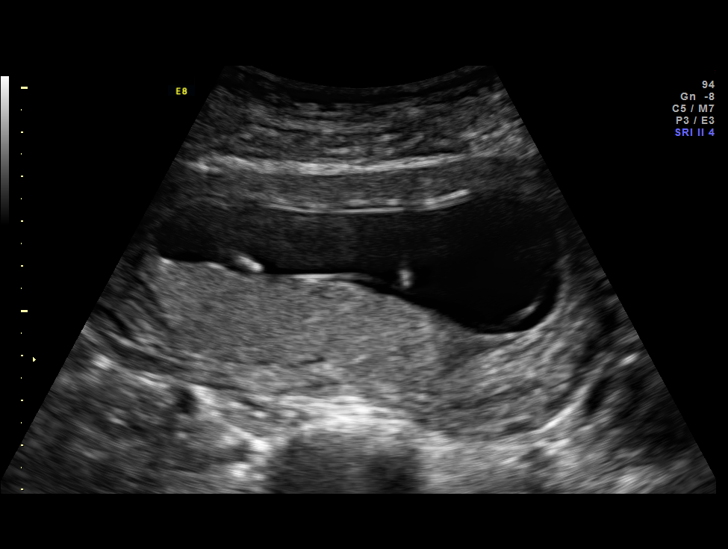
[im 12/27]
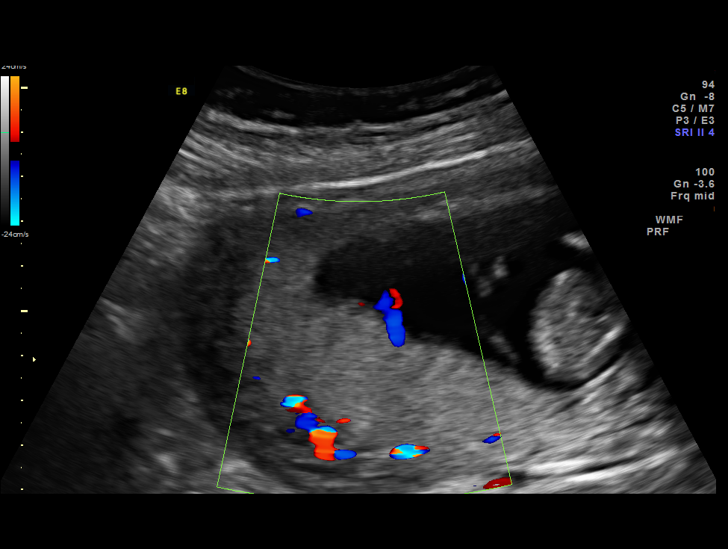
[im 14/27]
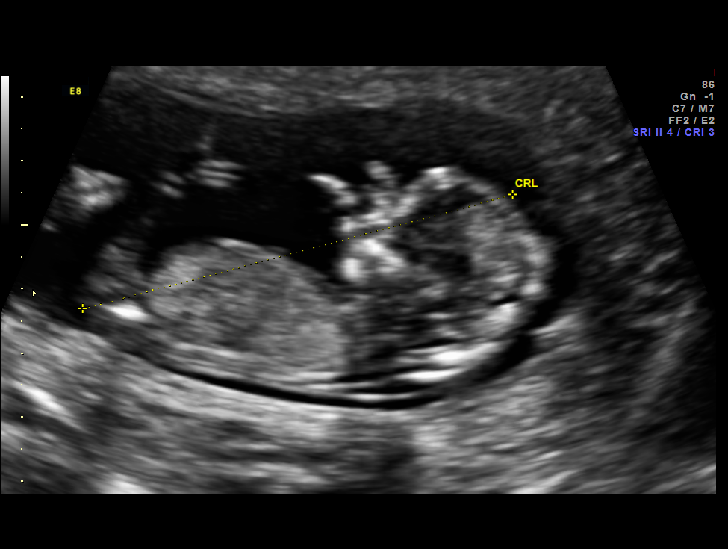
[im 16/27]
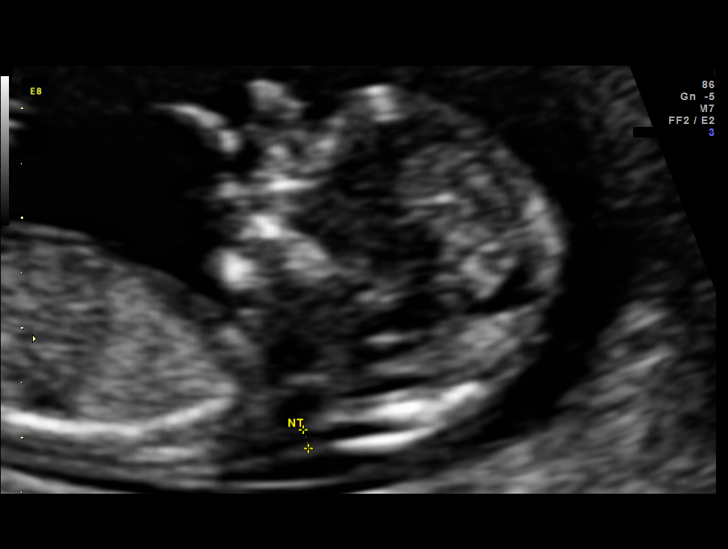
[im 18/27]
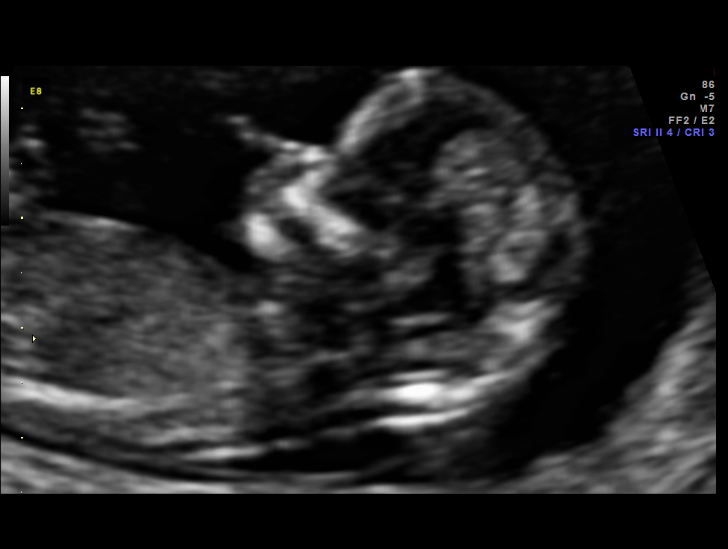
[im 20/27]
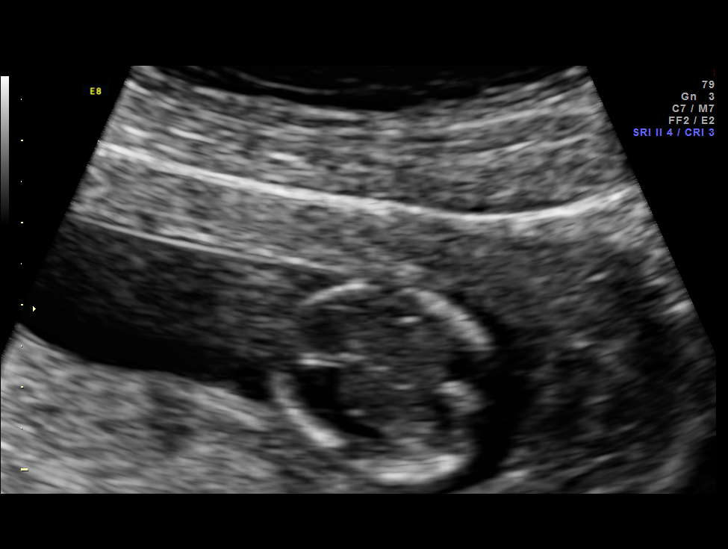
[im 22/27]
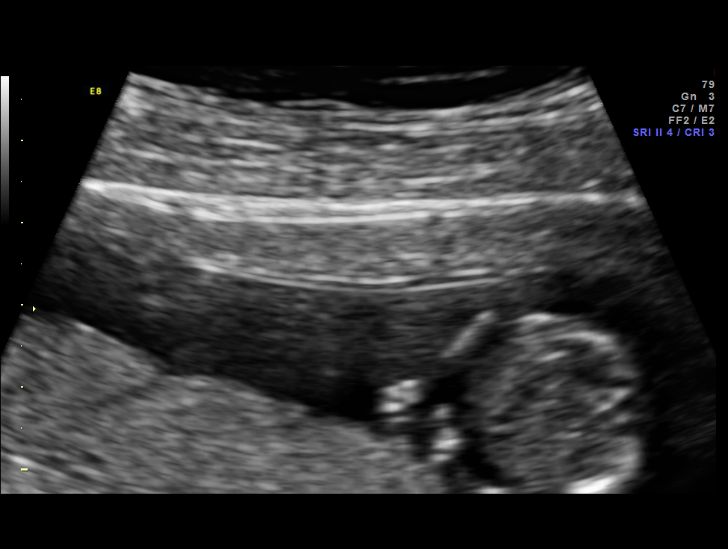
[im 24/27]
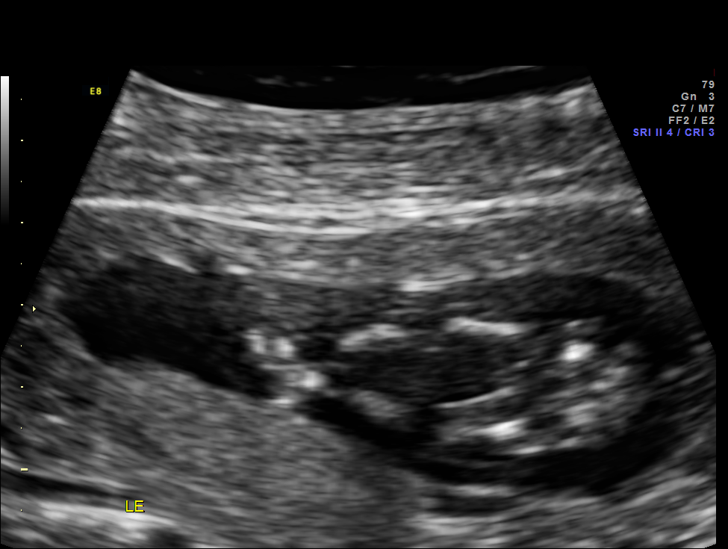
[im 26/27]
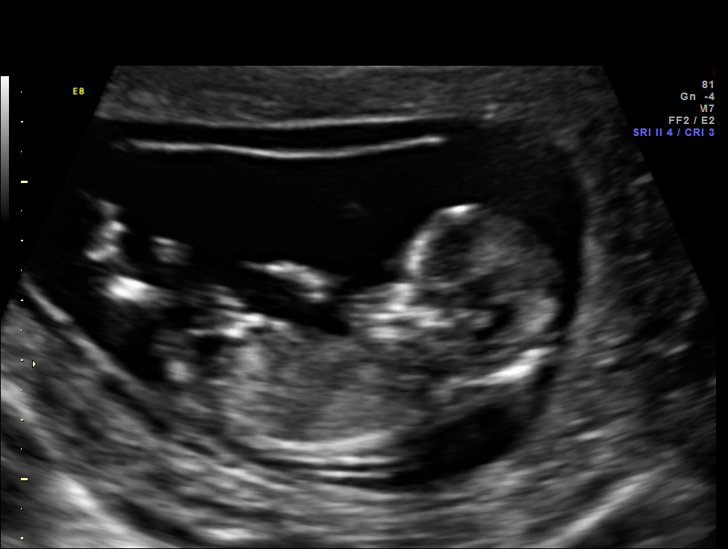

[13 of 27 positions shown; findings below may reference images not displayed]

OBSTETRICS REPORT
                      (Signed Final 12/05/2013 [DATE])

Service(s) Provided

 US FETAL NUCHAL TRANSLUCENCY                          76813.0
 MEASUREMENT
Indications

 First trimester aneuploidy screen (NT)
Fetal Evaluation

 Num Of Fetuses:    1
 Fetal Heart Rate:  157                          bpm
 Cardiac Activity:  Observed
 Presentation:      Variable
 Placenta:          Posterior, above cervical
                    os
 P. Cord            Visualized
 Insertion:

 Amniotic Fluid
 AFI FV:      Subjectively within normal limits
Gestational Age

 LMP:           13w 0d        Date:  09/05/13                 EDD:   06/12/14
 Best:          13w 0d     Det. By:  LMP  (09/05/13)          EDD:   06/12/14
1st Trimester Genetic Sonogram Screening

 CRL:            69.8  mm    G. Age:   13w 0d                 EDD:   06/12/14
 Nuc Trans:       1.9  mm
 Nasal Bone:                 Present
Cervix Uterus Adnexa

 Cervix:       Normal appearance by transabdominal scan. Appears
               closed, without funnelling.
 Left Ovary:    Within normal limits.
 Right Ovary:   Within normal limits.
Impression

 Single IUP at 13 0/7 weeks
 NT 1.9 mm.  Nasal bone visualized.
 First trimester aneuploidy screen performed as noted above.
Recommendations

 Please do not draw triple/quad screen, though patient should
 be offered MSAFP for neural tube defect screening.
 Recommend ultrasound for fetal anatomy at 
 18 weeks
 gestation

 questions or concerns.

## 2016-05-07 ENCOUNTER — Encounter (HOSPITAL_COMMUNITY): Payer: Self-pay | Admitting: *Deleted

## 2016-05-07 ENCOUNTER — Inpatient Hospital Stay (HOSPITAL_COMMUNITY)
Admission: AD | Admit: 2016-05-07 | Discharge: 2016-05-07 | Disposition: A | Discharge status: Home / Self Care | Payer: BLUE CROSS/BLUE SHIELD | Source: Ambulatory Visit | Attending: Obstetrics & Gynecology | Admitting: Obstetrics & Gynecology

## 2016-05-07 DIAGNOSIS — O98813 Other maternal infectious and parasitic diseases complicating pregnancy, third trimester: Secondary | ICD-10-CM

## 2016-05-07 DIAGNOSIS — O26893 Other specified pregnancy related conditions, third trimester: Secondary | ICD-10-CM | POA: Diagnosis not present

## 2016-05-07 DIAGNOSIS — M25551 Pain in right hip: Secondary | ICD-10-CM | POA: Insufficient documentation

## 2016-05-07 DIAGNOSIS — Z87891 Personal history of nicotine dependence: Secondary | ICD-10-CM | POA: Insufficient documentation

## 2016-05-07 DIAGNOSIS — B3731 Acute candidiasis of vulva and vagina: Secondary | ICD-10-CM

## 2016-05-07 DIAGNOSIS — Z3A28 28 weeks gestation of pregnancy: Secondary | ICD-10-CM

## 2016-05-07 DIAGNOSIS — B373 Candidiasis of vulva and vagina: Secondary | ICD-10-CM | POA: Diagnosis not present

## 2016-05-07 DIAGNOSIS — M25552 Pain in left hip: Secondary | ICD-10-CM | POA: Insufficient documentation

## 2016-05-07 DIAGNOSIS — O9989 Other specified diseases and conditions complicating pregnancy, childbirth and the puerperium: Secondary | ICD-10-CM | POA: Insufficient documentation

## 2016-05-07 DIAGNOSIS — N898 Other specified noninflammatory disorders of vagina: Secondary | ICD-10-CM | POA: Diagnosis present

## 2016-05-07 LAB — URINALYSIS, ROUTINE W REFLEX MICROSCOPIC
Bilirubin Urine: NEGATIVE
GLUCOSE, UA: NEGATIVE mg/dL
HGB URINE DIPSTICK: NEGATIVE
Ketones, ur: 15 mg/dL — AB
Nitrite: NEGATIVE
Protein, ur: NEGATIVE mg/dL
SPECIFIC GRAVITY, URINE: 1.02 (ref 1.005–1.030)
pH: 7 (ref 5.0–8.0)

## 2016-05-07 LAB — WET PREP, GENITAL
Clue Cells Wet Prep HPF POC: NONE SEEN
SPERM: NONE SEEN
Trich, Wet Prep: NONE SEEN

## 2016-05-07 LAB — URINE MICROSCOPIC-ADD ON

## 2016-05-07 MED ORDER — BUTALBITAL-APAP-CAFFEINE 50-325-40 MG PO TABS
1.0000 | ORAL_TABLET | Freq: Once | ORAL | Status: AC
  Administered 2016-05-07: 1 via ORAL
  Filled 2016-05-07: qty 1

## 2016-05-07 MED ORDER — FLUCONAZOLE 200 MG PO TABS: 200.0000 mg | ORAL_TABLET | Freq: Every day | ORAL | 0 refills | Status: AC

## 2016-05-07 NOTE — Discharge Instructions (Signed)

## 2016-05-07 NOTE — MAU Provider Note (Signed)
History   G4P1021 @ 28.3 wks in with c/o vag discharge for several days that burns and itched. Tried OTC yeast cream which made it worse. Also has ongoing bilaterial hip pain that has gotten worse over past several weeks. Denies ROM of Vag bleeding.  CSN: [id number]  Arrival date & time 05/07/16  1559   None     Chief Complaint  Patient presents with  . Vaginitis  . Hip Pain    HPI  Past Medical History:  Diagnosis Date  . Gonorrhea   . Headache(784.0)   . Infection    UTI  . Shoulder dystocia, delivered, current hospitalization 06/14/2014  . Vaginal Pap smear, abnormal    f/u ok    Past Surgical History:  Procedure Laterality Date  . NO PAST SURGERIES      Family History  Problem Relation Age of Onset  . Hearing loss Neg Hx     Social History  Substance Use Topics  . Smoking status: Former Games developer  . Smokeless tobacco: Former Neurosurgeon     Comment: not every day smoker, prior to the preg  . Alcohol use No    OB History    Gravida Para Term Preterm AB Living   SAB TAB Ectopic Multiple Live Births   2       1      Review of Systems  Constitutional: Negative.   HENT: Negative.   Eyes: Negative.   Respiratory: Negative.   Cardiovascular: Negative.   Gastrointestinal: Negative.   Endocrine: Negative.   Genitourinary: Positive for vaginal discharge.  Musculoskeletal:       Bilaterial hip pain  Skin: Negative.   Allergic/Immunologic: Negative.   Neurological: Negative.   Hematological: Negative.   Psychiatric/Behavioral: Negative.     Allergies  Review of patient's allergies indicates no known allergies.  Home Medications    BP 109/68   Pulse 112   Temp 98 F (36.7 C) (Oral)   Resp 16   LMP 10/21/2015   Physical Exam  Constitutional: She is oriented to person, place, and time. She appears well-developed and well-nourished.  HENT:  Head: Normocephalic.  Eyes: Pupils are equal, round, and reactive to light.  Neck: Normal range of  motion.  Cardiovascular: Normal rate, regular rhythm, normal heart sounds and intact distal pulses.   Pulmonary/Chest: Effort normal and breath sounds normal.  Abdominal: Soft. Bowel sounds are normal.  Genitourinary: Vaginal discharge found.  Musculoskeletal: Normal range of motion.  Neurological: She is alert and oriented to person, place, and time. She has normal reflexes.  Skin: Skin is warm and dry.  Psychiatric: She has a normal mood and affect. Her behavior is normal. Judgment and thought content normal.    MAU Course  Procedures (including critical care time)  Labs Reviewed  URINALYSIS, ROUTINE W REFLEX MICROSCOPIC (NOT AT [facility]) - Abnormal; Notable for the following:       Result Value   APPearance HAZY (*)    Ketones, ur 15 (*)    Leukocytes, UA MODERATE (*)    All other components within normal limits  URINE MICROSCOPIC-ADD ON - Abnormal; Notable for the following:    Squamous Epithelial / LPF 6-30 (*)    Bacteria, UA FEW (*)    All other components within normal limits  WET PREP, GENITAL  GC/CHLAMYDIA PROBE AMP ([place]) NOT AT [facility]   No results found.   No diagnosis found.    MDM  SVE cl/th/post/high. Copious amt yellowish vag discharge noted. Wet prep, GC, Chla done. FHR pattern reasuring.

## 2016-05-09 LAB — GC/CHLAMYDIA PROBE AMP (~~LOC~~) NOT AT ARMC
Chlamydia: NEGATIVE
NEISSERIA GONORRHEA: NEGATIVE

## 2016-06-27 LAB — OB RESULTS CONSOLE GBS: GBS: POSITIVE

## 2016-07-13 ENCOUNTER — Telehealth (HOSPITAL_COMMUNITY): Payer: Self-pay | Admitting: *Deleted

## 2016-07-13 ENCOUNTER — Encounter (HOSPITAL_COMMUNITY): Payer: Self-pay

## 2016-07-13 NOTE — Telephone Encounter (Signed)
Preadmission screen  

## 2016-07-15 ENCOUNTER — Other Ambulatory Visit: Payer: Self-pay | Admitting: Obstetrics and Gynecology

## 2016-07-19 ENCOUNTER — Encounter (HOSPITAL_COMMUNITY)
Admission: RE | Admit: 2016-07-19 | Discharge: 2016-07-19 | Disposition: A | Discharge status: Home / Self Care | Payer: BLUE CROSS/BLUE SHIELD | Source: Ambulatory Visit | Attending: Obstetrics and Gynecology | Admitting: Obstetrics and Gynecology

## 2016-07-19 LAB — CBC
HEMATOCRIT: 31.4 % — AB (ref 36.0–46.0)
HEMOGLOBIN: 9.8 g/dL — AB (ref 12.0–15.0)
MCH: 24.6 pg — ABNORMAL LOW (ref 26.0–34.0)
MCHC: 31.2 g/dL (ref 30.0–36.0)
MCV: 78.9 fL (ref 78.0–100.0)
Platelets: 127 10*3/uL — ABNORMAL LOW (ref 150–400)
RBC: 3.98 MIL/uL (ref 3.87–5.11)
RDW: 15.2 % (ref 11.5–15.5)
WBC: 6.4 10*3/uL (ref 4.0–10.5)

## 2016-07-19 LAB — TYPE AND SCREEN
ABO/RH(D): B POS
ANTIBODY SCREEN: NEGATIVE

## 2016-07-19 NOTE — Patient Instructions (Signed)
[address]  07/19/2016   Your procedure is scheduled on:  09/19/2016  Enter through the Main Entrance of [facility] at 1:30 PM.  Pick up the phone at the desk and dial [phone].   Call this number if you have problems the morning of surgery: [phone]   Remember:   Do not eat food:After Midnight.  Do not drink clear liquids: 4 Hours before arrival.  Take these medicines the morning of surgery with A SIP OF WATER: none   Do not wear jewelry, make-up or nail polish.  Do not wear lotions, powders, or perfumes. Do not wear deodorant.  Do not shave 48 hours prior to surgery.  Do not bring valuables to the hospital.  [facility] is not   responsible for any belongings or valuables brought to the hospital.  Contacts, dentures or bridgework may not be worn into surgery.  Leave suitcase in the car. After surgery it may be brought to your room.  For patients admitted to the hospital, checkout time is 11:00 AM the day of              discharge.   Patients discharged the day of surgery will not be allowed to drive             home.  Name and phone number of your driver: [email or web address]   Special Instructions:   N/A   Please read over the following fact sheets that you were given:   Surgical Site Infection Prevention

## 2016-07-19 NOTE — H&P (Signed)
Seymour BarsYan [name] "[name] ElizabethYani" is a 30 y.o. female, [id number] @ 39.0 wks (as dated by sure LMP - 10/21/15) presents for primary c-section w/ BTL.EDC 07/27/16. Endorses FM. Denies bleeding, leaking or ctxs.   Prenatal course significant for:  1. H/O shoulder dystocia from VAVD on 06/14/14. Baby suffered a fractured clavicle - birthwt 8lbs 3 oz - in NICU x 1 wk due to other problems.  C-section offered this pregnancy and pt accepted. Pt also elects tubal ligation.  2. Growth scan at 38.1 wks = NORMAL EFW (6 lbs 10 oz - 2994 g), normal AFI AND BPP, BUT HC AND BPD LAGGING, <3RD% - per Dr. Sallye OberKulwa, findings MAY BE GENETIC AS PARENTS OF BABY HAVE NORMAL/SMALL SIZE HEADS.  3. Abnormal 1hr (138). Did not undergo 3hr GTT. Hbg A1C 4.8. 4. Late entry to care - entered at 14.1 wks. 5. Allergy to pollen extracts.  OB History    Gravida Para Term Preterm AB Living   4 1 1   2 1    SAB TAB Ectopic Multiple Live Births   2       1      Past Medical History:  Diagnosis Date  . Gonorrhea   . Headache(784.0)   . Infection    UTI  . Shoulder dystocia, delivered, current hospitalization 06/14/2014  . Vaginal Pap smear, abnormal    f/u ok   Past Surgical History:  Procedure Laterality Date  . NO PAST SURGERIES     Family History: family history is not on file. Social History:  reports that she has quit smoking. She has quit using smokeless tobacco. She reports that she does not drink alcohol or use drugs.     Maternal Diabetes: Unknown - 28-wk glucola = 138. Pt did not undergo 3hr GTT; Hbg A1C = 4.8 Genetic Screening: Normal Maternal Ultrasounds/Referrals: HC AND BPD LAGGING <3RD%; EFW 2994 g - 6 lbs 10 oz Fetal Ultrasounds or other Referrals:  None Maternal Substance Abuse:  No Significant Maternal Medications:  Meds include: Other: PNV Significant Maternal Lab Results:  Lab values include: Group B Strep positive, Other: Hbg 10.5 at 28 wks; abnormal 1hr - Hbg A1C = 4.8 Other Comments:  Flu 07/07/16; Tdap  05/12/16  ROS 10 Systems reviewed and are negative for acute change except as noted in the HPI.    Maternal Medical History:      Last menstrual period 10/21/2015. Maternal Exam:  Abdomen: Patient reports no abdominal tenderness. Fundal height is CWD.   Fetal presentation: vertex  Introitus: Normal vulva. Normal vagina.  Cervix: not evaluated.   Fetal Exam Fetal Monitor Review: Mode: fetoscope.   Baseline rate: 150s.  Variability: moderate (6-25 bpm).   Pattern: accelerations present and no decelerations.    Fetal State Assessment: Category I - tracings are normal.     Physical Exam  Constitutional: She is oriented to person, place, and time. She appears well-developed and well-nourished.  HENT:  Head: Normocephalic and atraumatic.  Neck: Normal range of motion.  Cardiovascular: Normal rate, regular rhythm and normal heart sounds.  Respiratory: Effort normal and breath sounds normal.  GI: Soft. Bowel sounds are normal. Abdomen is gravid.  Neurological: She is alert and oriented to person, place, and time.  Skin: Skin is warm and dry.  Psychiatric: She has a normal mood and affect. Her behavior is normal.   Prenatal labs: ABO, Rh: --/--/B POS (10/17 1245) Antibody: NEG (10/17 1245) Rubella: Immune (04/27 0000) RPR: Nonreactive (04/27 0000)  HBsAg: Negative (04/27  0000)  HIV: Non-reactive (04/27 0000)  GBS: Positive (09/25 0000)  GC/CT: Neg/Neg (01/28/16)  Assessment/Plan: 30 y/o P1 at 39.0 wks. FWB: Reassuring by doppler. The patient will undergo a primary cesarean section (see rationale above) and tubal sterilization procedure.She understands the indications for surgical procedure.She accepts the risk of, but not limited to, anesthetic complications, bleeding, infections, and possible damage to the surrounding organs. She understands that there is a small but real failure rate associated with tubal sterilization. Patient verbalized understanding and consent signed  and witnessed. -Admit to Pre-op -NPO -Ancef to OR -SCDs to OR -LR IV     Sherre Scarlet 07/20/2016, 12:37 AM

## 2016-07-20 ENCOUNTER — Encounter (HOSPITAL_COMMUNITY): Payer: Self-pay | Admitting: Emergency Medicine

## 2016-07-20 ENCOUNTER — Inpatient Hospital Stay (HOSPITAL_COMMUNITY): Payer: BLUE CROSS/BLUE SHIELD | Admitting: Anesthesiology

## 2016-07-20 ENCOUNTER — Encounter (HOSPITAL_COMMUNITY)
Admission: RE | Disposition: A | Discharge status: Home / Self Care | Payer: Self-pay | Source: Ambulatory Visit | Attending: Obstetrics and Gynecology

## 2016-07-20 ENCOUNTER — Inpatient Hospital Stay (HOSPITAL_COMMUNITY)
Admission: RE | Admit: 2016-07-20 | Discharge: 2016-07-22 | DRG: 766 | Disposition: A | Discharge status: Home / Self Care | Payer: BLUE CROSS/BLUE SHIELD | Source: Ambulatory Visit | Attending: Obstetrics and Gynecology | Admitting: Obstetrics and Gynecology

## 2016-07-20 DIAGNOSIS — O99824 Streptococcus B carrier state complicating childbirth: Secondary | ICD-10-CM | POA: Diagnosis present

## 2016-07-20 DIAGNOSIS — Z98891 History of uterine scar from previous surgery: Secondary | ICD-10-CM

## 2016-07-20 DIAGNOSIS — D649 Anemia, unspecified: Secondary | ICD-10-CM | POA: Diagnosis present

## 2016-07-20 DIAGNOSIS — Z87891 Personal history of nicotine dependence: Secondary | ICD-10-CM | POA: Diagnosis not present

## 2016-07-20 DIAGNOSIS — B951 Streptococcus, group B, as the cause of diseases classified elsewhere: Secondary | ICD-10-CM

## 2016-07-20 DIAGNOSIS — Z9109 Other allergy status, other than to drugs and biological substances: Secondary | ICD-10-CM

## 2016-07-20 DIAGNOSIS — O9081 Anemia of the puerperium: Secondary | ICD-10-CM | POA: Diagnosis present

## 2016-07-20 DIAGNOSIS — Z3A39 39 weeks gestation of pregnancy: Secondary | ICD-10-CM | POA: Diagnosis not present

## 2016-07-20 DIAGNOSIS — Z302 Encounter for sterilization: Secondary | ICD-10-CM

## 2016-07-20 DIAGNOSIS — Z8759 Personal history of other complications of pregnancy, childbirth and the puerperium: Secondary | ICD-10-CM | POA: Diagnosis not present

## 2016-07-20 DIAGNOSIS — O0930 Supervision of pregnancy with insufficient antenatal care, unspecified trimester: Secondary | ICD-10-CM

## 2016-07-20 DIAGNOSIS — O26893 Other specified pregnancy related conditions, third trimester: Secondary | ICD-10-CM | POA: Diagnosis present

## 2016-07-20 DIAGNOSIS — R7309 Other abnormal glucose: Secondary | ICD-10-CM

## 2016-07-20 LAB — RPR: RPR: NONREACTIVE

## 2016-07-20 SURGERY — Surgical Case
Anesthesia: Spinal | Laterality: Bilateral

## 2016-07-20 MED ORDER — SODIUM CHLORIDE 0.9 % IR SOLN
Status: DC | PRN
  Administered 2016-07-20: 1000 mL

## 2016-07-20 MED ORDER — DEXAMETHASONE SODIUM PHOSPHATE 4 MG/ML IJ SOLN
INTRAMUSCULAR | Status: AC
  Filled 2016-07-20: qty 1

## 2016-07-20 MED ORDER — DIBUCAINE 1 % RE OINT: 1.0000 "application " | TOPICAL_OINTMENT | RECTAL | Status: DC | PRN

## 2016-07-20 MED ORDER — MENTHOL 3 MG MT LOZG: 1.0000 | LOZENGE | OROMUCOSAL | Status: DC | PRN

## 2016-07-20 MED ORDER — SIMETHICONE 80 MG PO CHEW
80.0000 mg | CHEWABLE_TABLET | Freq: Three times a day (TID) | ORAL | Status: DC
  Administered 2016-07-21 – 2016-07-22 (×4): 80 mg via ORAL
  Filled 2016-07-20 (×4): qty 1

## 2016-07-20 MED ORDER — ACETAMINOPHEN 500 MG PO TABS
1000.0000 mg | ORAL_TABLET | Freq: Four times a day (QID) | ORAL | Status: AC
  Administered 2016-07-21 (×3): 1000 mg via ORAL
  Filled 2016-07-20 (×3): qty 2

## 2016-07-20 MED ORDER — SCOPOLAMINE 1 MG/3DAYS TD PT72
MEDICATED_PATCH | TRANSDERMAL | Status: AC
  Administered 2016-07-20: 1.5 mg via TRANSDERMAL
  Filled 2016-07-20: qty 1

## 2016-07-20 MED ORDER — LACTATED RINGERS IV SOLN
INTRAVENOUS | Status: DC | PRN
Start: 2016-07-20 — End: 2016-07-20
  Administered 2016-07-20: 16:00:00 via INTRAVENOUS
  Administered 2016-07-20: 1000 mL via INTRAVENOUS
  Administered 2016-07-20 (×2): via INTRAVENOUS

## 2016-07-20 MED ORDER — COCONUT OIL OIL
1.0000 "application " | TOPICAL_OIL | Status: DC | PRN
  Administered 2016-07-22: 1 via TOPICAL
  Filled 2016-07-20: qty 120

## 2016-07-20 MED ORDER — LACTATED RINGERS IV SOLN
INTRAVENOUS | Status: DC | PRN
  Administered 2016-07-20: 16:00:00 via INTRAVENOUS

## 2016-07-20 MED ORDER — DIPHENHYDRAMINE HCL 50 MG/ML IJ SOLN
12.5000 mg | Freq: Once | INTRAMUSCULAR | Status: AC
  Administered 2016-07-20: 12.5 mg via INTRAVENOUS
  Filled 2016-07-20: qty 1

## 2016-07-20 MED ORDER — PHENYLEPHRINE 8 MG IN D5W 100 ML (0.08MG/ML) PREMIX OPTIME
INJECTION | INTRAVENOUS | Status: AC
  Filled 2016-07-20: qty 100

## 2016-07-20 MED ORDER — LACTATED RINGERS IV SOLN
Freq: Once | INTRAVENOUS | Status: AC
  Administered 2016-07-20: 14:00:00 via INTRAVENOUS

## 2016-07-20 MED ORDER — PHENYLEPHRINE HCL 10 MG/ML IJ SOLN
INTRAMUSCULAR | Status: DC | PRN
  Administered 2016-07-20 (×4): 40 ug via INTRAVENOUS

## 2016-07-20 MED ORDER — MORPHINE SULFATE (PF) 0.5 MG/ML IJ SOLN
INTRAMUSCULAR | Status: DC | PRN
  Administered 2016-07-20: .2 mg via INTRATHECAL

## 2016-07-20 MED ORDER — IBUPROFEN 600 MG PO TABS
600.0000 mg | ORAL_TABLET | Freq: Four times a day (QID) | ORAL | Status: DC
  Administered 2016-07-21 – 2016-07-22 (×7): 600 mg via ORAL
  Filled 2016-07-20 (×7): qty 1

## 2016-07-20 MED ORDER — NALBUPHINE HCL 10 MG/ML IJ SOLN: 5.0000 mg | INTRAMUSCULAR | Status: DC | PRN

## 2016-07-20 MED ORDER — MORPHINE SULFATE-NACL 0.5-0.9 MG/ML-% IV SOSY
PREFILLED_SYRINGE | INTRAVENOUS | Status: AC
  Filled 2016-07-20: qty 1

## 2016-07-20 MED ORDER — METOCLOPRAMIDE HCL 5 MG/ML IJ SOLN
INTRAMUSCULAR | Status: AC
  Filled 2016-07-20: qty 2

## 2016-07-20 MED ORDER — LACTATED RINGERS IV SOLN
INTRAVENOUS | Status: DC
  Administered 2016-07-21: 02:00:00 via INTRAVENOUS

## 2016-07-20 MED ORDER — NALBUPHINE HCL 10 MG/ML IJ SOLN: 5.0000 mg | Freq: Once | INTRAMUSCULAR | Status: DC | PRN

## 2016-07-20 MED ORDER — KETOROLAC TROMETHAMINE 30 MG/ML IJ SOLN: 30.0000 mg | Freq: Once | INTRAMUSCULAR | Status: DC

## 2016-07-20 MED ORDER — OXYTOCIN 10 UNIT/ML IJ SOLN
INTRAMUSCULAR | Status: AC
  Filled 2016-07-20: qty 4

## 2016-07-20 MED ORDER — ONDANSETRON HCL 4 MG/2ML IJ SOLN
INTRAMUSCULAR | Status: AC
  Filled 2016-07-20: qty 2

## 2016-07-20 MED ORDER — ONDANSETRON HCL 4 MG/2ML IJ SOLN
INTRAMUSCULAR | Status: DC | PRN
  Administered 2016-07-20: 4 mg via INTRAVENOUS

## 2016-07-20 MED ORDER — DEXAMETHASONE SODIUM PHOSPHATE 4 MG/ML IJ SOLN
INTRAMUSCULAR | Status: DC | PRN
  Administered 2016-07-20: 4 mg via INTRAVENOUS

## 2016-07-20 MED ORDER — BUPIVACAINE IN DEXTROSE 0.75-8.25 % IT SOLN
INTRATHECAL | Status: DC | PRN
  Administered 2016-07-20: 1.4 mL via INTRATHECAL

## 2016-07-20 MED ORDER — KETOROLAC TROMETHAMINE 30 MG/ML IJ SOLN: 30.0000 mg | Freq: Four times a day (QID) | INTRAMUSCULAR | Status: AC | PRN

## 2016-07-20 MED ORDER — SODIUM CHLORIDE 0.9% FLUSH: 3.0000 mL | INTRAVENOUS | Status: DC | PRN

## 2016-07-20 MED ORDER — METOCLOPRAMIDE HCL 5 MG/ML IJ SOLN
INTRAMUSCULAR | Status: DC | PRN
  Administered 2016-07-20: 10 mg via INTRAVENOUS

## 2016-07-20 MED ORDER — MEPERIDINE HCL 25 MG/ML IJ SOLN: 6.2500 mg | INTRAMUSCULAR | Status: DC | PRN

## 2016-07-20 MED ORDER — FENTANYL CITRATE (PF) 100 MCG/2ML IJ SOLN
INTRAMUSCULAR | Status: DC | PRN
  Administered 2016-07-20: 20 ug via INTRATHECAL

## 2016-07-20 MED ORDER — OXYTOCIN 40 UNITS IN LACTATED RINGERS INFUSION - SIMPLE MED: 2.5000 [IU]/h | INTRAVENOUS | Status: AC

## 2016-07-20 MED ORDER — NALBUPHINE HCL 10 MG/ML IJ SOLN
5.0000 mg | INTRAMUSCULAR | Status: DC | PRN
Start: 2016-07-20 — End: 2016-07-22

## 2016-07-20 MED ORDER — PHENYLEPHRINE 8 MG IN D5W 100 ML (0.08MG/ML) PREMIX OPTIME
INJECTION | INTRAVENOUS | Status: DC | PRN
  Administered 2016-07-20: 60 ug/min via INTRAVENOUS

## 2016-07-20 MED ORDER — TETANUS-DIPHTH-ACELL PERTUSSIS 5-2.5-18.5 LF-MCG/0.5 IM SUSP: 0.5000 mL | Freq: Once | INTRAMUSCULAR | Status: DC

## 2016-07-20 MED ORDER — HYDROMORPHONE HCL 1 MG/ML IJ SOLN: 0.2500 mg | INTRAMUSCULAR | Status: DC | PRN

## 2016-07-20 MED ORDER — DIPHENHYDRAMINE HCL 25 MG PO CAPS: 25.0000 mg | ORAL_CAPSULE | ORAL | Status: DC | PRN

## 2016-07-20 MED ORDER — CEFAZOLIN SODIUM-DEXTROSE 2-4 GM/100ML-% IV SOLN
2.0000 g | Freq: Once | INTRAVENOUS | Status: AC
  Administered 2016-07-20: 2 g via INTRAVENOUS

## 2016-07-20 MED ORDER — WITCH HAZEL-GLYCERIN EX PADS: 1.0000 "application " | MEDICATED_PAD | CUTANEOUS | Status: DC | PRN

## 2016-07-20 MED ORDER — DIPHENHYDRAMINE HCL 50 MG/ML IJ SOLN: 12.5000 mg | INTRAMUSCULAR | Status: DC | PRN

## 2016-07-20 MED ORDER — FENTANYL CITRATE (PF) 100 MCG/2ML IJ SOLN
INTRAMUSCULAR | Status: AC
  Filled 2016-07-20: qty 2

## 2016-07-20 MED ORDER — SCOPOLAMINE 1 MG/3DAYS TD PT72
1.0000 | MEDICATED_PATCH | Freq: Once | TRANSDERMAL | Status: DC
  Administered 2016-07-20: 1.5 mg via TRANSDERMAL

## 2016-07-20 MED ORDER — SIMETHICONE 80 MG PO CHEW
80.0000 mg | CHEWABLE_TABLET | ORAL | Status: DC
  Administered 2016-07-21: 80 mg via ORAL
  Filled 2016-07-20 (×2): qty 1

## 2016-07-20 MED ORDER — NALOXONE HCL 2 MG/2ML IJ SOSY: 1.0000 ug/kg/h | PREFILLED_SYRINGE | INTRAVENOUS | Status: DC | PRN

## 2016-07-20 MED ORDER — PHENYLEPHRINE 40 MCG/ML (10ML) SYRINGE FOR IV PUSH (FOR BLOOD PRESSURE SUPPORT)
PREFILLED_SYRINGE | INTRAVENOUS | Status: AC
  Filled 2016-07-20: qty 10

## 2016-07-20 MED ORDER — DIPHENHYDRAMINE HCL 25 MG PO CAPS: 25.0000 mg | ORAL_CAPSULE | Freq: Four times a day (QID) | ORAL | Status: DC | PRN

## 2016-07-20 MED ORDER — NALOXONE HCL 0.4 MG/ML IJ SOLN: 0.4000 mg | INTRAMUSCULAR | Status: DC | PRN

## 2016-07-20 MED ORDER — PROMETHAZINE HCL 25 MG/ML IJ SOLN: 6.2500 mg | INTRAMUSCULAR | Status: DC | PRN

## 2016-07-20 MED ORDER — LACTATED RINGERS IV SOLN
INTRAVENOUS | Status: DC | PRN
  Administered 2016-07-20: 40 [IU] via INTRAVENOUS

## 2016-07-20 MED ORDER — SIMETHICONE 80 MG PO CHEW: 80.0000 mg | CHEWABLE_TABLET | ORAL | Status: DC | PRN

## 2016-07-20 MED ORDER — ZOLPIDEM TARTRATE 5 MG PO TABS: 5.0000 mg | ORAL_TABLET | Freq: Every evening | ORAL | Status: DC | PRN

## 2016-07-20 MED ORDER — SCOPOLAMINE 1 MG/3DAYS TD PT72: 1.0000 | MEDICATED_PATCH | Freq: Once | TRANSDERMAL | Status: DC

## 2016-07-20 MED ORDER — ACETAMINOPHEN 325 MG PO TABS: 650.0000 mg | ORAL_TABLET | ORAL | Status: DC | PRN

## 2016-07-20 MED ORDER — SENNOSIDES-DOCUSATE SODIUM 8.6-50 MG PO TABS
2.0000 | ORAL_TABLET | ORAL | Status: DC
  Administered 2016-07-21 (×2): 2 via ORAL
  Filled 2016-07-20 (×2): qty 2

## 2016-07-20 MED ORDER — PRENATAL MULTIVITAMIN CH
1.0000 | ORAL_TABLET | Freq: Every day | ORAL | Status: DC
  Administered 2016-07-21 – 2016-07-22 (×2): 1 via ORAL
  Filled 2016-07-20 (×2): qty 1

## 2016-07-20 MED ORDER — ONDANSETRON HCL 4 MG/2ML IJ SOLN: 4.0000 mg | Freq: Three times a day (TID) | INTRAMUSCULAR | Status: DC | PRN

## 2016-07-20 MED ORDER — IBUPROFEN 600 MG PO TABS: 600.0000 mg | ORAL_TABLET | Freq: Four times a day (QID) | ORAL | Status: DC | PRN

## 2016-07-20 SURGICAL SUPPLY — 36 items
BENZOIN TINCTURE PRP APPL 2/3 (GAUZE/BANDAGES/DRESSINGS) ×3 IMPLANT
CLAMP CORD UMBIL (MISCELLANEOUS) IMPLANT
CLOSURE STERI STRIP 1/2 X4 (GAUZE/BANDAGES/DRESSINGS) ×3 IMPLANT
CLOTH BEACON ORANGE TIMEOUT ST (SAFETY) ×3 IMPLANT
CONTAINER PREFILL 10% NBF 15ML (MISCELLANEOUS) IMPLANT
DRAIN JACKSON PRT FLT 10 (DRAIN) IMPLANT
DRSG OPSITE POSTOP 4X10 (GAUZE/BANDAGES/DRESSINGS) ×3 IMPLANT
DURAPREP 26ML APPLICATOR (WOUND CARE) ×3 IMPLANT
ELECT REM PT RETURN 9FT ADLT (ELECTROSURGICAL) ×3
ELECTRODE REM PT RTRN 9FT ADLT (ELECTROSURGICAL) ×1 IMPLANT
EVACUATOR SILICONE 100CC (DRAIN) IMPLANT
EXTRACTOR VACUUM M CUP 4 TUBE (SUCTIONS) IMPLANT
EXTRACTOR VACUUM M CUP 4' TUBE (SUCTIONS)
GLOVE BIO SURGEON STRL SZ 6.5 (GLOVE) ×2 IMPLANT
GLOVE BIO SURGEONS STRL SZ 6.5 (GLOVE) ×1
GLOVE BIOGEL PI IND STRL 7.0 (GLOVE) ×2 IMPLANT
GLOVE BIOGEL PI INDICATOR 7.0 (GLOVE) ×4
GOWN STRL REUS W/TWL LRG LVL3 (GOWN DISPOSABLE) ×6 IMPLANT
KIT ABG SYR 3ML LUER SLIP (SYRINGE) IMPLANT
NEEDLE HYPO 25X5/8 SAFETYGLIDE (NEEDLE) IMPLANT
NS IRRIG 1000ML POUR BTL (IV SOLUTION) ×3 IMPLANT
PACK C SECTION WH (CUSTOM PROCEDURE TRAY) ×3 IMPLANT
PAD OB MATERNITY 4.3X12.25 (PERSONAL CARE ITEMS) ×3 IMPLANT
PENCIL SMOKE EVAC W/HOLSTER (ELECTROSURGICAL) ×3 IMPLANT
RTRCTR C-SECT PINK 25CM LRG (MISCELLANEOUS) IMPLANT
STRIP CLOSURE SKIN 1/2X4 (GAUZE/BANDAGES/DRESSINGS) ×2 IMPLANT
SUT CHROMIC 0 CT 1 (SUTURE) ×3 IMPLANT
SUT MNCRL AB 3-0 PS2 27 (SUTURE) ×3 IMPLANT
SUT PLAIN 2 0 (SUTURE) ×4
SUT PLAIN 2 0 XLH (SUTURE) ×3 IMPLANT
SUT PLAIN ABS 2-0 CT1 27XMFL (SUTURE) ×2 IMPLANT
SUT SILK 2 0 SH (SUTURE) IMPLANT
SUT VIC AB 0 CTX 36 (SUTURE) ×10
SUT VIC AB 0 CTX36XBRD ANBCTRL (SUTURE) ×5 IMPLANT
TOWEL OR 17X24 6PK STRL BLUE (TOWEL DISPOSABLE) ×3 IMPLANT
TRAY FOLEY CATH SILVER 14FR (SET/KITS/TRAYS/PACK) ×3 IMPLANT

## 2016-07-20 NOTE — Anesthesia Postprocedure Evaluation (Signed)
Anesthesia Post Note  Patient: [name] [name]  Procedure(s) Performed: Procedure(s) (LRB): REPEAT CESAREAN SECTION WITH BILATERAL TUBAL LIGATION (Bilateral)  Patient location during evaluation: Mother Baby Anesthesia Type: Spinal Level of consciousness: awake and alert Pain management: satisfactory to patient Vital Signs Assessment: post-procedure vital signs reviewed and stable Respiratory status: respiratory function stable Cardiovascular status: stable Postop Assessment: no headache, no backache and patient able to bend at knees Anesthetic complications: no Comments: The patient states that she has been nauseated all day, even before the procedure, and it has been worse since her delivery. She has received multiple antiemetics with minimal relief. Dr. Judie PetitM.  Foster consulted and 12.5 mg of Benadryl IV will be administered shortly.       Last Vitals:  Vitals:   07/20/16 1800 07/20/16 1827  BP: 112/74 107/75  Pulse: 77 74  Resp: 17 18  Temp:  36.4 C    Last Pain:  Vitals:   07/20/16 1827  TempSrc: Oral   Pain Goal: Patients Stated Pain Goal: 0 (07/20/16 1645)               Greg Eckrich

## 2016-07-20 NOTE — Transfer of Care (Signed)
Immediate Anesthesia Transfer of Care Note  Patient: [name] [name]  Procedure(s) Performed: Procedure(s) with comments: REPEAT CESAREAN SECTION WITH BILATERAL TUBAL LIGATION (Bilateral) - Provider requests RNFA -ap  Patient Location: PACU  Anesthesia Type:Spinal  Level of Consciousness: awake, alert  and oriented  Airway & Oxygen Therapy: Patient Spontanous Breathing  Post-op Assessment: Report given to RN and Post -op Vital signs reviewed and stable  Post vital signs: Reviewed and stable  Last Vitals:  Vitals:   07/20/16 1326  BP: 119/81  Pulse: 88  Resp: 16  Temp: 36.7 C    Last Pain:  Vitals:   07/20/16 1326  TempSrc: Oral      Patients Stated Pain Goal: 3 (07/20/16 1326)  Complications: No apparent anesthesia complications

## 2016-07-20 NOTE — Anesthesia Preprocedure Evaluation (Signed)
Anesthesia Evaluation  Patient identified by MRN, date of birth, ID band Patient awake    Reviewed: Allergy & Precautions, H&P , NPO status , Patient's Chart, lab work & pertinent test results  Airway Mallampati: I  TM Distance: >3 FB Neck ROM: full    Dental no notable dental hx.    Pulmonary neg pulmonary ROS, former smoker,    Pulmonary exam normal        Cardiovascular negative cardio ROS Normal cardiovascular exam     Neuro/Psych negative psych ROS   GI/Hepatic negative GI ROS, Neg liver ROS,   Endo/Other  negative endocrine ROS  Renal/GU negative Renal ROS     Musculoskeletal negative musculoskeletal ROS (+)   Abdominal Normal abdominal exam  (+)   Peds  Hematology negative hematology ROS (+)   Anesthesia Other Findings   Reproductive/Obstetrics (+) Pregnancy                             Anesthesia Physical Anesthesia Plan  ASA: II  Anesthesia Plan: Spinal   Post-op Pain Management:    Induction:   Airway Management Planned:   Additional Equipment:   Intra-op Plan:   Post-operative Plan:   Informed Consent: I have reviewed the patients History and Physical, chart, labs and discussed the procedure including the risks, benefits and alternatives for the proposed anesthesia with the patient or authorized representative who has indicated his/her understanding and acceptance.     Plan Discussed with: CRNA and Surgeon  Anesthesia Plan Comments:         Anesthesia Quick Evaluation

## 2016-07-20 NOTE — Op Note (Signed)
Cesarean Section Procedure Note   Seymour BarsYan Long  07/20/2016  Indications: previous shoulder dystocia causing injury to the infant   Pre-operative Diagnosis: same as above with Desire for Sterilization.   Post-operative Diagnosis: Same   Surgeon Joice Nazario  Assistants: RNFA  Anesthesia: spinal   Procedure Details:  The patient was seen in the Holding Room. The risks, benefits, complications, treatment options, and expected outcomes were discussed with the patient. The patient concurred with the proposed plan, giving informed consent. identified as Seymour BarsYan Long and the procedure verified as C-Section Delivery. A Time Out was held and the above information confirmed.  After induction of anesthesia, the patient was draped and prepped in the usual sterile manner. A transverse incision was made and carried down through the subcutaneous tissue to the fascia. Fascial incision was made and extended transversely. The fascia was separated from the underlying rectus tissue superiorly and inferiorly. The peritoneum was identified and entered. Peritoneal incision was extended longitudinally. The utero-vesical peritoneal reflection was incised transversely and the bladder flap was bluntly freed from the lower uterine segment. A low transverse uterine incision was made. Delivered from cephalic presentation was a  pound Female with Apgar scores of 8 at one minute and 10 at five minutes. Cord ph was not sent the umbilical cord was clamped and cut cord blood was obtained for evaluation. The placenta was removed Intact and appeared normal. The uterine outline, tubes and ovaries appeared normal}. The uterine incision was closed with running locked sutures of 0Vicryl. A second layer of 0 vicryl was used to imbricate the uterus.  The patients left fallopian tube was grasped at the mid ishtmic portion with babcock clamp, ligated with 2-0 plain and excised. Small adhesion from the bowel to the fimbria of the tube.    The  patients right fallopian tube was followed out to the fimbriated end.  The mid isthmic portion of the tube was ligated with 2-0 plain and excised.  Both portions of tubes was sent to pathology.     Hemostasis was observed. Lavage was carried out until clear. The fascia was then reapproximated with running sutures of 0Vicryl. The subcuticular closure was performed using 2-0plain gut. The skin was closed with 3-30monocryl.   Instrument, sponge, and needle counts were correct prior the abdominal closure and were correct at the conclusion of the case.    Findings:   Estimated Blood Loss: 850cc  Total IV Fluids: 2500ml   Urine Output: 300CC OF clear urine  Specimens: placenta to path  Complications: no complications  Disposition: PACU - hemodynamically stable.   Maternal Condition: stable   Baby condition / location:  Couplet care / Skin to Skin  Attending Attestation: I performed the procedure.   Signed: Surgeon(s): Jaymes GraffNaima Shacoya Burkhammer, MD

## 2016-07-20 NOTE — Anesthesia Procedure Notes (Signed)
Spinal  Patient location during procedure: OR Start time: 07/20/2016 3:20 PM End time: 07/20/2016 3:23 PM Staffing Anesthesiologist: Leilani AbleHATCHETT, Moiz Ryant Performed: anesthesiologist  Preanesthetic Checklist Completed: patient identified, surgical consent, pre-op evaluation, timeout performed, IV checked, risks and benefits discussed and monitors and equipment checked Spinal Block Patient position: sitting Prep: site prepped and draped and DuraPrep Patient monitoring: heart rate, cardiac monitor, continuous pulse ox and blood pressure Approach: midline Location: L3-4 Injection technique: single-shot Needle Needle type: Sprotte  Needle gauge: 24 G Needle length: 9 cm Needle insertion depth: 6 cm Assessment Sensory level: T4

## 2016-07-20 NOTE — Anesthesia Postprocedure Evaluation (Signed)
Anesthesia Post Note  Patient: [name] [name]  Procedure(s) Performed: Procedure(s) (LRB): REPEAT CESAREAN SECTION WITH BILATERAL TUBAL LIGATION (Bilateral)  Patient location during evaluation: PACU Anesthesia Type: Spinal Level of consciousness: awake Pain management: pain level controlled Vital Signs Assessment: post-procedure vital signs reviewed and stable Respiratory status: spontaneous breathing Cardiovascular status: stable Postop Assessment: no headache, no backache, spinal receding, patient able to bend at knees and no signs of nausea or vomiting Anesthetic complications: no     Last Vitals:  Vitals:   07/20/16 1645 07/20/16 1700  BP: 98/68 100/64  Pulse: 78 75  Resp: 19 18  Temp: 36.5 C     Last Pain:  Vitals:   07/20/16 1645  TempSrc: Oral   Pain Goal: Patients Stated Pain Goal: 0 (07/20/16 1645)               Angala Hilgers JR,JOHN Layth Cerezo

## 2016-07-20 NOTE — Lactation Note (Signed)
This note was copied from a baby's chart. Lactation Consultation Note  Patient Name: [name] Seymour BarsYan Long ZOXWR'UToday's Date: 07/20/2016 Reason for consult: Initial assessment   Initial consult with Exp BF mom of 1 hour old infant in PACU. Infant previously fed in OR for a few minutes per RN. Infant STS with mom and awake and rooting. She was latched to right breast in the laid back position. She latched very easily with flanged lips, rhythmic suckles and intermittent swallows. Infant fed for 15 minutes and self detached. Infant was then repositioned to left breast and she latched easily with flanged lips, rhythmic suckles and intermittent swallows. Mom did well with helping infant to latch.   Mom with compressible breasts and areola with everted nipples. Colostrum easily expressible.   Enc mom to feed infant 8-12 x in 24 hours at first feeding cues. Enc mom to massage/compress breast with feeding. Showed mom how to hand express and colostrum easily available from both breasts. Mom will need teaching in regards to positioning with next visit. Reviewed latch with mom.   BF Resources Handout and LC Brochure given, informed mom of IP/OP Services, BF Support Groups and LC phone #. Enc mom to call for feeding assistance as needed. Mom is aware of feeding log and its use.  [facility] and is aware to call post d/c for appt. Mom reports she has ordered a breast pump from insurance. Follow up tomorrow and prn.      Maternal Data Formula Feeding for Exclusion: No Has patient been taught Hand Expression?: Yes Does the patient have breastfeeding experience prior to this delivery?: Yes  Feeding Feeding Type: Breast Fed Length of feed: 2 min (few sucks on OR table)  LATCH Score/Interventions Latch: Grasps breast easily, tongue down, lips flanged, rhythmical sucking. Intervention(s): Adjust position;Assist with latch;Breast massage;Breast compression  Audible Swallowing: A few with  stimulation Intervention(s): Alternate breast massage;Hand expression;Skin to skin  Type of Nipple: Everted at rest and after stimulation  Comfort (Breast/Nipple): Soft / non-tender     Hold (Positioning): Assistance needed to correctly position infant at breast and maintain latch. Intervention(s): Breastfeeding basics reviewed;Support Pillows;Position options;Skin to skin  LATCH Score: 8  Lactation Tools Discussed/Used WIC Program: Yes   Consult Status Consult Status: Follow-up Date: 07/21/16 Follow-up type: In-patient    Silas FloodSharon S Vyla Pint 07/20/2016, 4:59 PM

## 2016-07-20 NOTE — Addendum Note (Signed)
Addendum  created 07/20/16 1955 by Graciela HusbandsWynn O Kailand Seda, CRNA   Sign clinical note

## 2016-07-21 LAB — CBC
HEMATOCRIT: 24.2 % — AB (ref 36.0–46.0)
HEMOGLOBIN: 7.8 g/dL — AB (ref 12.0–15.0)
MCH: 24.7 pg — ABNORMAL LOW (ref 26.0–34.0)
MCHC: 32.2 g/dL (ref 30.0–36.0)
MCV: 76.6 fL — ABNORMAL LOW (ref 78.0–100.0)
Platelets: 114 10*3/uL — ABNORMAL LOW (ref 150–400)
RBC: 3.16 MIL/uL — AB (ref 3.87–5.11)
RDW: 14.9 % (ref 11.5–15.5)
WBC: 11.6 10*3/uL — ABNORMAL HIGH (ref 4.0–10.5)

## 2016-07-21 MED ORDER — HYDROMORPHONE HCL 2 MG PO TABS
2.0000 mg | ORAL_TABLET | ORAL | Status: DC | PRN
  Administered 2016-07-21: 2 mg via ORAL
  Filled 2016-07-21: qty 1

## 2016-07-21 MED ORDER — KETOROLAC TROMETHAMINE 30 MG/ML IJ SOLN: 30.0000 mg | Freq: Once | INTRAMUSCULAR | Status: DC

## 2016-07-21 MED ORDER — BUTORPHANOL TARTRATE 1 MG/ML IJ SOLN
1.0000 mg | INTRAMUSCULAR | Status: DC
  Administered 2016-07-21 (×3): 1 mg via INTRAVENOUS
  Filled 2016-07-21 (×3): qty 1

## 2016-07-21 MED ORDER — OXYCODONE-ACETAMINOPHEN 5-325 MG PO TABS
1.0000 | ORAL_TABLET | ORAL | Status: DC | PRN
  Administered 2016-07-21 – 2016-07-22 (×4): 2 via ORAL
  Filled 2016-07-21 (×4): qty 2

## 2016-07-21 NOTE — Progress Notes (Signed)
Subjective:  Postpartum Day 1: Cesarean Delivery Patient reports tolerating PO.  Denies dizziness.  Objective: Vital signs in last 24 hours: Temp:  [97.5 F (36.4 C)-98.8 F (37.1 C)] 97.5 F (36.4 C) (10/19 0500) Pulse Rate:  [64-88] 77 (10/19 0500) Resp:  [15-19] 18 (10/19 0500) BP: (92-120)/(49-83) 92/49 (10/19 0500) SpO2:  [96 %-100 %] 97 % (10/19 0500)  Physical Exam:  General: alert and no distress Lochia: appropriate Uterine Fundus: firm Incision: Dressing clean and dry DVT Evaluation: No evidence of DVT seen on physical exam.   Recent Labs  07/19/16 1245 07/21/16 0559  HGB 9.8* 7.8*  HCT 31.4* 24.2*    Assessment/Plan: Status post Cesarean section. Doing well postoperatively.  Check orthostatics. Txn discussed. R and B reviewed. Declined for now. Repeat platelets in the morning.  Genessis Flanary V 07/21/2016, 8:58 AM

## 2016-07-22 LAB — CBC
HCT: 24.5 % — ABNORMAL LOW (ref 36.0–46.0)
Hemoglobin: 7.7 g/dL — ABNORMAL LOW (ref 12.0–15.0)
MCH: 24.4 pg — ABNORMAL LOW (ref 26.0–34.0)
MCHC: 31.4 g/dL (ref 30.0–36.0)
MCV: 77.8 fL — ABNORMAL LOW (ref 78.0–100.0)
Platelets: 110 10*3/uL — ABNORMAL LOW (ref 150–400)
RBC: 3.15 MIL/uL — ABNORMAL LOW (ref 3.87–5.11)
RDW: 15.2 % (ref 11.5–15.5)
WBC: 11.7 10*3/uL — ABNORMAL HIGH (ref 4.0–10.5)

## 2016-07-22 MED ORDER — FERROUS SULFATE 325 (65 FE) MG PO TABS: 325.0000 mg | ORAL_TABLET | Freq: Two times a day (BID) | ORAL | 3 refills | Status: DC

## 2016-07-22 MED ORDER — FERROUS SULFATE 325 (65 FE) MG PO TABS
325.0000 mg | ORAL_TABLET | Freq: Two times a day (BID) | ORAL | Status: DC
  Administered 2016-07-22: 325 mg via ORAL
  Filled 2016-07-22: qty 1

## 2016-07-22 MED ORDER — OXYCODONE-ACETAMINOPHEN 5-325 MG PO TABS: 1.0000 | ORAL_TABLET | ORAL | 0 refills | Status: DC | PRN

## 2016-07-22 MED ORDER — SENNOSIDES-DOCUSATE SODIUM 8.6-50 MG PO TABS: 2.0000 | ORAL_TABLET | ORAL | 1 refills | Status: DC

## 2016-07-22 NOTE — Discharge Summary (Signed)
Obstetric Discharge Summary Reason for Admission: cesarean section Prenatal Procedures: none Intrapartum Procedures: cesarean: low cervical, transverse and tubal ligation Postpartum Procedures: none Complications-Operative and Postpartum: none Hemoglobin  Date Value Ref Range Status  07/22/2016 7.7 (L) 12.0 - 15.0 g/dL Final   HCT  Date Value Ref Range Status  07/22/2016 24.5 (L) 36.0 - 46.0 % Final    Physical Exam:  General: alert, cooperative and no distress Mood/Affect: Appropriate/Appropriate Lungs: clear to auscultation, no wheezes, rales or rhonchi, symmetric air entry.  Heart: normal rate and regular rhythm. Breast: breasts appear normal, no suspicious masses, no skin or nipple changes or axillary nodes, breastfeeding. Abdomen:  + bowel sounds in BUQ, Soft, Appropriately Tendery Incision: Honeycomb dressing CDI Uterine Fundus: firm at umbilicus Lochia: appropriate Skin: Warm, Dry, petechia type scars noted on BLE DVT Evaluation: No evidence of DVT seen on physical exam. No cords or calf tenderness. JP drain:   None  Discharge Diagnoses: Term Pregnancy-delivered and S/p Primary C/S, BTL, and Asymptomatic Anemia  Discharge Information: Date: 07/22/2016 Activity: pelvic rest Diet: routine Medications: PNV, Ibuprofen, Colace, Iron and Percocet Condition: stable Instructions: refer to practice specific booklet Incision care, Postpartum Depression, Activity Restrictions, Breastfeeding, Pain Mgmt, Who and when to call for PP Concerns, Follow Up Appointment, Information Sheet Given Iron Rich Diet, Care after BTL  Discharge to: home Follow-up Information    [facility] week(s).   Specialty:  Obstetrics and Gynecology Why:  Please call if any issues, questions, or concerns arise prior to appointment.  Contact information: [address] [place] [id number] [phone]          Newborn  Data: Live born female  Birth Weight: 6 lb 8.4 oz (2960 g) APGAR: , 10  Home with mother.  Cherre RobinsJessica L Trevyn Lumpkin MSN, CNM 07/22/2016, 1:35 PM

## 2016-07-22 NOTE — Lactation Note (Signed)
This note was copied from a baby's chart. Lactation Consultation Note  Patient Name: [name] [name] NGEXB'MToday's Date: 07/22/2016 Reason for consult: Follow-up assessment  Baby is 9043 hours old and has been consistent at the breast , according to the doc flow sheets.  And per mom feels comfortable with breast feeding except nipples feeling tender.  Baby already latched swallows , released few seconds, and re- latched with assist with improved depth,  LC flipped upper lip to flanged position, multiply swallows noted afterwards.  LC reviewed basics of latching, and importance of depth and alternating between at least 2 positions.  Sore nipple and engorgement prevention and tx reviewed. LC had orientee show mom how to use the hand pump.  Cleaning. LC showed mom how to use the shells for sensitive nipples.  Per mom will be getting a DEBP delivered today . Mother informed of post-discharge support and given phone number to the lactation department, including  services for phone call assistance; out-patient appointments; and breastfeeding support group. List of other breastfeeding  resources in the community given in the handout. Encouraged mother to call for problems or concerns related to breastfeeding.   Maternal Data    Feeding Feeding Type: Breast Fed Length of feed: 15 min (multiple swallows noted, increased w/ breast compressions)  LATCH Score/Interventions Latch: Grasps breast easily, tongue down, lips flanged, rhythmical sucking. Intervention(s): Adjust position;Assist with latch  Audible Swallowing: Spontaneous and intermittent Intervention(s): Skin to skin  Type of Nipple: Everted at rest and after stimulation  Comfort (Breast/Nipple): Soft / non-tender     Hold (Positioning): Assistance needed to correctly position infant at breast and maintain latch. Intervention(s): Position options  LATCH Score: 9  Lactation Tools Discussed/Used Tools: Pump;Shells Shell Type:  Inverted Breast pump type: Manual Pump Review: Setup, frequency, and cleaning Initiated by:: [name] RN Date initiated:: 07/22/16   Consult Status Consult Status: Complete Date: 07/22/16    [name] [name] 07/22/2016, 11:07 AM

## 2016-07-22 NOTE — Progress Notes (Signed)
In room to assess.  Patient asleep.  Did not attempt to awake.  Will return later.  Cherre RobinsJessica L Matteson Blue MSN, CNM 07/22/2016 9:24 AM

## 2016-07-22 NOTE — Discharge Instructions (Signed)
Iron-Rich Diet °Iron is a mineral that helps your body to produce hemoglobin. Hemoglobin is a protein in your red blood cells that carries oxygen to your body's tissues. Eating too little iron may cause you to feel weak and tired, and it can increase your risk for infection. Eating enough iron is necessary for your body's metabolism, muscle function, and nervous system. °Iron is naturally found in many foods. It can also be added to foods or fortified in foods. There are two types of dietary iron: °· Heme iron. Heme iron is absorbed by the body more easily than nonheme iron. Heme iron is found in meat, poultry, and fish. °· Nonheme iron. Nonheme iron is found in dietary supplements, iron-fortified grains, beans, and vegetables. °You may need to follow an iron-rich diet if: °· You have been diagnosed with iron deficiency or iron-deficiency anemia. °· You have a condition that prevents you from absorbing dietary iron, such as: °¨ Infection in your intestines. °¨ Celiac disease. This involves long-lasting (chronic) inflammation of your intestines. °· You do not eat enough iron. °· You eat a diet that is high in foods that impair iron absorption. °· You have lost a lot of blood. °· You have heavy bleeding during your menstrual cycle. °· You are pregnant. °WHAT IS MY PLAN? °Your health care provider may help you to determine how much iron you need per day based on your condition. Generally, when a person consumes sufficient amounts of iron in the diet, the following iron needs are met: °· Men. °¨ 14-18 years old: 11 mg per day. °¨ 19-50 years old: 8 mg per day. °· Women.   °¨ 14-18 years old: 15 mg per day. °¨ 19-50 years old: 18 mg per day. °¨ Over 50 years old: 8 mg per day. °¨ Pregnant women: 27 mg per day. °¨ Breastfeeding women: 9 mg per day. °WHAT DO I NEED TO KNOW ABOUT AN IRON-RICH DIET? °· Eat fresh fruits and vegetables that are high in vitamin C along with foods that are high in iron. This will help increase  the amount of iron that your body absorbs from food, especially with foods containing nonheme iron. Foods that are high in vitamin C include oranges, peppers, tomatoes, and mango. °· Take iron supplements only as directed by your health care provider. Overdose of iron can be life-threatening. If you were prescribed iron supplements, take them with orange juice or a vitamin C supplement. °· Cook foods in pots and pans that are made from iron.   °· Eat nonheme iron-containing foods alongside foods that are high in heme iron. This helps to improve your iron absorption.   °· Certain foods and drinks contain compounds that impair iron absorption. Avoid eating these foods in the same meal as iron-rich foods or with iron supplements. These include: °¨ Coffee, black tea, and red wine. °¨ Milk, dairy products, and foods that are high in calcium. °¨ Beans, soybeans, and peas. °¨ Whole grains. °· When eating foods that contain both nonheme iron and compounds that impair iron absorption, follow these tips to absorb iron better.   °¨ Soak beans overnight before cooking. °¨ Soak whole grains overnight and drain them before using. °¨ Ferment flours before baking, such as using yeast in bread dough. °WHAT FOODS CAN I EAT? °Grains  °Iron-fortified breakfast cereal. Iron-fortified whole-wheat bread. Enriched rice. Sprouted grains. °Vegetables  °Spinach. Potatoes with skin. Green peas. Broccoli. Red and green bell peppers. Fermented vegetables. °Fruits  °Prunes. Raisins. Oranges. Strawberries. Mango. Grapefruit. °Meats and Other Protein   Sources Beef liver. Oysters. Beef. Shrimp. [place]. Chicken. [place]. Sardines. Chickpeas. Nuts. Tofu. Beverages Tomato juice. Fresh orange juice. Prune juice. Hibiscus tea. Fortified instant breakfast shakes. Condiments Tahini. Fermented soy sauce. Sweets and Desserts Black-strap molasses.  Other Wheat germ. The items listed above may not be a complete list of recommended foods or  beverages. Contact your dietitian for more options. WHAT FOODS ARE NOT RECOMMENDED? Grains Whole grains. Bran cereal. Bran flour. Oats. Vegetables Artichokes. Brussels sprouts. Kale. Fruits Blueberries. Raspberries. Strawberries. Figs. Meats and Other Protein Sources Soybeans. Products made from soy protein. Dairy Milk. Cream. Cheese. Yogurt. Cottage cheese. Beverages Coffee. Black tea. Red wine. Sweets and Desserts Cocoa. Chocolate. Ice cream. Other Basil. Oregano. Parsley. The items listed above may not be a complete list of foods and beverages to avoid. Contact your dietitian for more information.   This information is not intended to replace advice given to you by your health care provider. Make sure you discuss any questions you have with your health care provider.   Document Released: 05/03/2005 Document Revised: 10/10/2014 Document Reviewed: 04/16/2014 Elsevier Interactive Patient Education 2016 [place]. Postpartum Tubal Ligation, Care After Refer to this sheet in the next few weeks. These instructions provide you with information about caring for yourself after your procedure. Your health care provider may also give you more specific instructions. Your treatment has been planned according to current medical practices, but problems sometimes occur. Call your health care provider if you have any problems or questions after your procedure. WHAT TO EXPECT AFTER THE PROCEDURE After your procedure, it is common to have:  Sore throat.  Soreness at the incision site.  Mild cramping.  Tiredness.  Mild nausea or vomiting. HOME CARE INSTRUCTIONS  Rest for the remainder of the day.  Take medicines only as directed by your health care provider. These include over-the-counter medicines and prescription medicines. Do not take aspirin, which can cause bleeding.  Over the next few days, gradually return to your normal activities and your normal diet.  Avoid sexual  intercourse for 2 weeks or as directed by your health care provider.  Do not drive or operate heavy machinery while taking pain medicine.  Do not lift anything that is heavier than 5 lb (2.3 kg) for 2 weeks or as directed by your health care provider.  Do not take baths. Take showers only. Ask your health care provider when you can start taking baths.  Take your temperature twice each day and write it down.  Try to have help for the first 7-10 days for your household needs.  There are many different ways to close and cover an incision, including stitches (sutures), skin glue, and adhesive strips. Follow instructions from your health care provider about:  Incision care.  Bandage (dressing) changes and removal.  Incision closure removal.  Check your incision area every day for signs of infection. Watch for:  Redness, swelling, or pain.  Fluid, blood, or pus.  Keep all follow-up visits as directed by your health care provider. SEEK MEDICAL CARE IF:  You have redness, swelling, or increasing pain in your incision area.  You have fluid or pus coming from your incision for longer than 1 day.  You notice a bad smell coming from your incision or your dressing.  The edges of your incision break open after the sutures have been removed.  Your pain does not decrease after 2-3 days.  You have a rash.  You repeatedly become dizzy or light-headed.  You have a  reaction to your medicine.  Your pain medicine is not helping.  You are constipated. SEEK IMMEDIATE MEDICAL CARE IF:   You have a fever.  You faint.  You have increasing pain in your abdomen.  You have bleeding or drainage from your suture sites or your vagina after surgery.  You have shortness of breath or have difficulty breathing.  You have chest pain or leg pain.  You have ongoing nausea, vomiting, or diarrhea.   This information is not intended to replace advice given to you by your health care provider.  Make sure you discuss any questions you have with your health care provider.   Document Released: 03/20/2012 Document Revised: 02/03/2015 Document Reviewed: 03/20/2012 Elsevier Interactive Patient Education [employer].

## 2016-07-22 NOTE — Progress Notes (Signed)
[name] [name] [id number] Postpartum Day 2 S/P Primary Cesarean Section with BTL due to history of shoulder dystocia with fetal injury in prior pregnancy.  Subjective: Patient up ad lib, denies syncope, sob, or dizziness. Reports consuming regular diet without issues and denies N/V. Patient reports no bowel movement but passing flatus.  Denies issues with urination and reports bleeding is "normal."  Patient is breastfeeding and reports going well.  Pain is being appropriately managed with use of percocet.   Objective: Temp:  [97.7 F (36.5 C)-98.6 F (37 C)] 98.6 F (37 C) (10/20 0555) Pulse Rate:  [62-76] 66 (10/20 0555) Resp:  [18-20] 20 (10/20 0555) BP: (101-113)/(54-69) 108/64 (10/20 0555) SpO2:  [99 %] 99 % (10/19 1410)   Recent Labs  07/19/16 1245 07/21/16 0559 07/22/16 0605  HGB 9.8* 7.8* 7.7*  HCT 31.4* 24.2* 24.5*  WBC 6.4 11.6* 11.7*    Physical Exam:  General: alert, cooperative and no distress Mood/Affect: Appropriate/Appropriate Lungs: clear to auscultation, no wheezes, rales or rhonchi, symmetric air entry.  Heart: normal rate and regular rhythm. Breast: breasts appear normal, no suspicious masses, no skin or nipple changes or axillary nodes, breastfeeding. Abdomen:  + bowel sounds in BUQ, Soft, Appropriately Tendery Incision: Honeycomb dressing CDI Uterine Fundus: firm at umbilicus Lochia: appropriate Skin: Warm, Dry, petechia type scars noted on BLE DVT Evaluation: No evidence of DVT seen on physical exam. No cords or calf tenderness. JP drain:   None  Assessment Post Operative Day 2 S/P Primary C/S Normal Involution Breast Feeding Hemodynamically Stable  Asymptomatic Anemia Desires D/C  Plan: -Discussed initiation of iron supplementation -Discussed pain mgmt -Discussed anemia symptoms -Okay for discharge today if infant discharged *D/C instructions given *Nurse instructed to give wound care instructions including removal of HCD 2 days after  discharge  -Okay for IV removal -Okay for shower -Continue other mgmt as ordered -Dr. Sallyanne HaversEK to be updated on patient status  Cherre RobinsJessica L Suyash Amory MSN, CNM 07/22/2016, 9:19 AM

## 2016-08-03 ENCOUNTER — Encounter (HOSPITAL_COMMUNITY): Payer: Self-pay | Admitting: *Deleted

## 2016-08-22 DIAGNOSIS — F53 Postpartum depression: Secondary | ICD-10-CM | POA: Insufficient documentation

## 2016-08-22 DIAGNOSIS — O99345 Other mental disorders complicating the puerperium: Secondary | ICD-10-CM

## 2016-11-21 ENCOUNTER — Emergency Department (HOSPITAL_COMMUNITY): Payer: BLUE CROSS/BLUE SHIELD

## 2016-11-21 ENCOUNTER — Encounter (HOSPITAL_COMMUNITY): Payer: Self-pay | Admitting: Emergency Medicine

## 2016-11-21 ENCOUNTER — Emergency Department (HOSPITAL_COMMUNITY)
Admission: EM | Admit: 2016-11-21 | Discharge: 2016-11-21 | Disposition: A | Discharge status: Home / Self Care | Payer: BLUE CROSS/BLUE SHIELD | Attending: Emergency Medicine | Admitting: Emergency Medicine

## 2016-11-21 DIAGNOSIS — Z79899 Other long term (current) drug therapy: Secondary | ICD-10-CM | POA: Diagnosis not present

## 2016-11-21 DIAGNOSIS — Z87891 Personal history of nicotine dependence: Secondary | ICD-10-CM | POA: Diagnosis not present

## 2016-11-21 DIAGNOSIS — M25552 Pain in left hip: Secondary | ICD-10-CM | POA: Diagnosis not present

## 2016-11-21 LAB — POC URINE PREG, ED: PREG TEST UR: NEGATIVE

## 2016-11-21 MED ORDER — NAPROXEN 500 MG PO TABS: 500.0000 mg | ORAL_TABLET | Freq: Two times a day (BID) | ORAL | 0 refills | Status: DC

## 2016-11-21 MED ORDER — KETOROLAC TROMETHAMINE 30 MG/ML IJ SOLN
30.0000 mg | Freq: Once | INTRAMUSCULAR | Status: AC
  Administered 2016-11-21: 30 mg via INTRAMUSCULAR
  Filled 2016-11-21: qty 1

## 2016-11-21 NOTE — ED Triage Notes (Addendum)
Pt c/o left hip pain that has been ongoing since giving birth in October;  states she first had left hip pain during her pregnancies; pain persisted after giving birth; pt was supposed to follow-up with PT but never did; pt recently went back to work and thinks that is aggravating the hip; pt also notes right knee pain that began about 3-4 days ago; ambulatory

## 2016-11-21 NOTE — Discharge Instructions (Signed)
You were seen today for hip pain. There is no obvious fracture or arthritis. You should start anti-inflammatory medications like naproxen twice a day. Follow-up with your primary physician for referral to physical therapy.

## 2016-11-21 NOTE — ED Provider Notes (Signed)
[facility]-EMERGENCY DEPT Provider Note   CSN: [id number] Arrival date & time: 11/21/16  0018  By signing my name below, I, Teofilo Pod, attest that this documentation has been prepared under the direction and in the presence of Shon Baton, MD . Electronically Signed: Teofilo Pod, ED Scribe. 11/21/2016. 2:27 AM.    History   Chief Complaint Chief Complaint  Patient presents with  . Hip Pain    The history is provided by the patient. No language interpreter was used.   HPI Comments:  [name] [name] is a 31 y.o. female who presents to the Emergency Department complaining of chronic, worsening left hip pain x ~1 year. Pt states that the pain began when she was 2 months pregnant. She states that the pain is worsened by sitting/standing for long periods. Pt reports that she was doing the dished yesterday and her legs gave out on her. She rates her current pain at 6 or 7/10. Pt was referred to PT by her OB/Gyn but has not been able to get in to PT. Pt denies any chance of pregnancy, LNMP was last week. No alleviating factors noted. Pt denies other associated symptoms.   Past Medical History:  Diagnosis Date  . Gonorrhea   . Headache(784.0)   . Infection    UTI  . Shoulder dystocia, delivered, current hospitalization 06/14/2014  . Vaginal Pap smear, abnormal    f/u ok    Patient Active Problem List   Diagnosis Date Noted  . History of shoulder dystocia in prior pregnancy 07/20/2016  . Positive GBS test 07/20/2016  . 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 Delivery of first pregnancy by vacuum extraction 07/20/2016  . Abnormal glucose level (1hr = 138); did not undergo 3hr GTT; Hbg A1C normal. 07/20/2016  . Late prenatal care - 14.1 wks 07/20/2016  . S/P primary low transverse C-section 07/20/2016  . S/P cesarean section 07/20/2016  . History of miscarriage 07/20/2013    Past Surgical History:  Procedure Laterality Date  . CESAREAN SECTION WITH BILATERAL TUBAL  LIGATION Bilateral 07/20/2016   Procedure: REPEAT CESAREAN SECTION WITH BILATERAL TUBAL LIGATION;  Surgeon: Jaymes Graff, MD;  Location: [facility];  Service: Obstetrics;  Laterality: Bilateral;  Provider requests RNFA -ap  . NO PAST SURGERIES      OB History    Gravida Para Term Preterm AB Living   4 2 2   2 2    SAB TAB Ectopic Multiple Live Births   2     0 2       Home Medications    Prior to Admission medications   Medication Sig Start Date End Date Taking? Authorizing Provider  ferrous sulfate 325 (65 FE) MG tablet Take 1 tablet (325 mg total) by mouth 2 (two) times daily with a meal. For 14 days, then once daily for 28 days. 07/22/16   Gerrit Heck, CNM  naproxen (NAPROSYN) 500 MG tablet Take 1 tablet (500 mg total) by mouth 2 (two) times daily. 11/21/16   Shon Baton, MD  oxyCODONE-acetaminophen (PERCOCET/ROXICET) 5-325 MG tablet Take 1-2 tablets by mouth every 4 (four) hours as needed for severe pain. 07/22/16   Gerrit Heck, CNM  Prenatal Vit-Fe Fumarate-FA (MULTIVITAMIN-PRENATAL) 27-0.8 MG TABS tablet Take 1 tablet by mouth daily.     Historical Provider, MD  senna-docusate (SENOKOT-S) 8.6-50 MG tablet Take 2 tablets by mouth daily. For 5 days or until regular bowel movement. Then take as needed. 07/23/16   Shanda Bumps  [place], CNM    Family History Family History  Problem Relation Age of Onset  . Hearing loss Neg Hx     Social History Social History  Substance Use Topics  . Smoking status: Former Games developermoker  . Smokeless tobacco: Former NeurosurgeonUser     Comment: not every day smoker, prior to the preg  . Alcohol use No     Allergies   Patient has no known allergies.   Review of Systems Review of Systems  Constitutional: Negative for fever.  Genitourinary: Negative for dysuria.  Musculoskeletal: Positive for arthralgias and myalgias.  All other systems reviewed and are negative.    Physical Exam Updated Vital Signs BP 126/81 (BP Location: Left Arm)   Pulse  75   Temp 98.2 F (36.8 C) (Oral)   Resp 16   Ht 5' (1.524 m)   Wt 132 lb (59.9 kg)   LMP 10/21/2015   SpO2 96%   Breastfeeding? Yes   BMI 25.78 kg/m   Physical Exam  Constitutional: She is oriented to person, place, and time. She appears well-developed and well-nourished.  HENT:  Head: Normocephalic and atraumatic.  Cardiovascular: Normal rate, regular rhythm and normal heart sounds.   Pulmonary/Chest: Effort normal and breath sounds normal. No respiratory distress. She has no wheezes.  Musculoskeletal: Normal range of motion.  Normal range of motion of the left hip and knee, no obvious deformities, tenderness palpation of the lateral aspect of the left hip, 2+ DP pulse  Neurological: She is alert and oriented to person, place, and time.  Skin: Skin is warm and dry.  Psychiatric: She has a normal mood and affect.  Nursing note and vitals reviewed.    ED Treatments / Results  DIAGNOSTIC STUDIES:  Oxygen Saturation is 98% on RA, normal by my interpretation.    COORDINATION OF CARE:  2:27 AM Discussed treatment plan with pt at bedside and pt agreed to plan.   Labs (all labs ordered are listed, but only abnormal results are displayed) Labs Reviewed  POC URINE PREG, ED    EKG  EKG Interpretation None       Radiology Dg Hip Unilat W Or Wo Pelvis 2-3 Views Left  Result Date: 11/21/2016 CLINICAL DATA:  Left hip pain EXAM: DG HIP (WITH OR WITHOUT PELVIS) 2-3V LEFT COMPARISON:  None. FINDINGS: There is no evidence of hip fracture or dislocation. There is no evidence of arthropathy or other focal bone abnormality. IMPRESSION: Negative. Electronically Signed   By: Jasmine PangKim  Fujinaga M.D.   On: 11/21/2016 03:27    Procedures Procedures (including critical care time)  Medications Ordered in ED Medications  ketorolac (TORADOL) 30 MG/ML injection 30 mg (30 mg Intramuscular Given 11/21/16 0234)     Initial Impression / Assessment and Plan / ED Course  I have reviewed the  triage vital signs and the nursing notes.  Pertinent labs & imaging results that were available during my care of the patient were reviewed by me and considered in my medical decision making (see chart for details).     Patient presents with acute on chronic left hip pain. Started with her pregnancy. She is not taking anything at home for pain. Screening x-rays obtained and reassuring. Patient given Toradol. Suspect an inflammatory process. Low suspicion for septic joint. Will trial of naproxen twice daily. Follow-up with primary physician for physical therapy referral.  After history, exam, and medical workup I feel the patient has been appropriately medically screened and is safe for discharge home. Pertinent  diagnoses were discussed with the patient. Patient was given return precautions.   Final Clinical Impressions(s) / ED Diagnoses   Final diagnoses:  Left hip pain    New Prescriptions Discharge Medication List as of 11/21/2016  3:38 AM    START taking these medications   Details  naproxen (NAPROSYN) 500 MG tablet Take 1 tablet (500 mg total) by mouth 2 (two) times daily., Starting Mon 11/21/2016, Print       I personally performed the services described in this documentation, which was scribed in my presence. The recorded information has been reviewed and is accurate.     Shon Baton, MD 11/21/16 [phone]

## 2017-01-25 ENCOUNTER — Encounter (HOSPITAL_COMMUNITY): Payer: Self-pay | Admitting: *Deleted

## 2017-01-25 ENCOUNTER — Emergency Department (HOSPITAL_COMMUNITY)
Admission: EM | Admit: 2017-01-25 | Discharge: 2017-01-25 | Disposition: A | Discharge status: Home / Self Care | Payer: BLUE CROSS/BLUE SHIELD | Attending: Emergency Medicine | Admitting: Emergency Medicine

## 2017-01-25 DIAGNOSIS — K0889 Other specified disorders of teeth and supporting structures: Secondary | ICD-10-CM | POA: Diagnosis present

## 2017-01-25 DIAGNOSIS — Z87891 Personal history of nicotine dependence: Secondary | ICD-10-CM | POA: Insufficient documentation

## 2017-01-25 DIAGNOSIS — Z79899 Other long term (current) drug therapy: Secondary | ICD-10-CM | POA: Insufficient documentation

## 2017-01-25 MED ORDER — NAPROXEN 500 MG PO TABS
500.0000 mg | ORAL_TABLET | Freq: Once | ORAL | Status: AC
  Administered 2017-01-25: 500 mg via ORAL
  Filled 2017-01-25: qty 1

## 2017-01-25 MED ORDER — PENICILLIN V POTASSIUM 500 MG PO TABS
500.0000 mg | ORAL_TABLET | Freq: Once | ORAL | Status: AC
  Administered 2017-01-25: 500 mg via ORAL
  Filled 2017-01-25: qty 1

## 2017-01-25 MED ORDER — PENICILLIN V POTASSIUM 500 MG PO TABS: 500.0000 mg | ORAL_TABLET | Freq: Four times a day (QID) | ORAL | 0 refills | Status: AC

## 2017-01-25 NOTE — Discharge Instructions (Signed)
Please take penicillin 4 times daily for 7 days. Please schedule an appointment with a dentist within 1 week regarding today's visit. Please see attached dental resource guide. Use warm salt water rinses. Take naproxen as needed for pain relief.   Contact a health care provider if: Your pain is not controlled with medicines. Your symptoms are worse. You have new symptoms. Get help right away if: You are unable to open your mouth. You are having trouble breathing or swallowing. You have a fever. Your face, neck, or jaw is swollen.

## 2017-01-25 NOTE — ED Triage Notes (Signed)
Pt is here for dental pain which she rates 10/10 at this time. Pt has had pain intermittently in this area for over a decade but it was previously not as severe.  Pt states that she suspects that this is related to her wisdom tooth.  She was previously told that she had to have it removed and due to a pregnancy at that time she had to cancel the surgery but never did reschedule, no facial swelling.  No fever with this

## 2017-01-25 NOTE — ED Provider Notes (Signed)
[facility]-EMERGENCY DEPT Provider Note   CSN: [id number] Arrival date & time: 01/25/17  1337   History   Chief Complaint Chief Complaint  Patient presents with  . Dental Pain   HPI Comments:  [name] [name] is a 31 y.o. female who presents to the Emergency Department complaining of her chronic dental pain x10 years. However, in the last week it has worsened and become sharp with a constant throbbing. It is exacerbated by talking and eating. Pt denies any dental trauma and states that she was scheduled to get her wisdom teeth removed x2 years ago, however canceled the surgery due to a pregnancy. Pt denies experiencing any fevers or chills.   The history is provided by the patient. No language interpreter was used.   Past Medical History:  Diagnosis Date  . Gonorrhea   . Headache(784.0)   . Infection    UTI  . Shoulder dystocia, delivered, current hospitalization 06/14/2014  . Vaginal Pap smear, abnormal    f/u ok    Patient Active Problem List   Diagnosis Date Noted  . History of shoulder dystocia in prior pregnancy 07/20/2016  . Positive GBS test 07/20/2016  . 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 Delivery of first pregnancy by vacuum extraction 07/20/2016  . Abnormal glucose level (1hr = 138); did not undergo 3hr GTT; Hbg A1C normal. 07/20/2016  . Late prenatal care - 14.1 wks 07/20/2016  . S/P primary low transverse C-section 07/20/2016  . S/P cesarean section 07/20/2016  . History of miscarriage 07/20/2013    Past Surgical History:  Procedure Laterality Date  . CESAREAN SECTION WITH BILATERAL TUBAL LIGATION Bilateral 07/20/2016   Procedure: REPEAT CESAREAN SECTION WITH BILATERAL TUBAL LIGATION;  Surgeon: Jaymes Graff, MD;  Location: [facility];  Service: Obstetrics;  Laterality: Bilateral;  Provider requests RNFA -ap  . NO PAST SURGERIES      OB History    Gravida Para Term Preterm AB Living   SAB TAB Ectopic Multiple Live Births   2     0  2       Home Medications    Prior to Admission medications   Medication Sig Start Date End Date Taking? Authorizing Provider  ferrous sulfate 325 (65 FE) MG tablet Take 1 tablet (325 mg total) by mouth 2 (two) times daily with a meal. For 14 days, then once daily for 28 days. 07/22/16   Gerrit Heck, CNM  naproxen (NAPROSYN) 500 MG tablet Take 1 tablet (500 mg total) by mouth 2 (two) times daily. 11/21/16   Shon Baton, MD  oxyCODONE-acetaminophen (PERCOCET/ROXICET) 5-325 MG tablet Take 1-2 tablets by mouth every 4 (four) hours as needed for severe pain. 07/22/16   Gerrit Heck, CNM  penicillin v potassium (VEETID) 500 MG tablet Take 1 tablet (500 mg total) by mouth 4 (four) times daily. 01/25/17 02/01/17  Alvina Chou, [place]  Prenatal Vit-Fe Fumarate-FA (MULTIVITAMIN-PRENATAL) 27-0.8 MG TABS tablet Take 1 tablet by mouth daily.     Historical Provider, MD  senna-docusate (SENOKOT-S) 8.6-50 MG tablet Take 2 tablets by mouth daily. For 5 days or until regular bowel movement. Then take as needed. 07/23/16   Gerrit Heck, CNM    Family History Family History  Problem Relation Age of Onset  . Hearing loss Neg Hx     Social History Social History  Substance Use Topics  . Smoking status: Former Games developer  . Smokeless tobacco: Former Neurosurgeon  Comment: not every day smoker, prior to the preg  . Alcohol use No     Allergies   Patient has no known allergies.   Review of Systems Review of Systems  Constitutional: Positive for chills. Negative for fever.  HENT: Positive for dental problem.    Physical Exam Updated Vital Signs BP 133/76 (BP Location: Left Arm)   Pulse 86   Temp 98 F (36.7 C) (Oral)   Resp 16   Ht 5' (1.524 m)   Wt 62.1 kg   LMP 10/21/2015   SpO2 99%   BMI 26.76 kg/m   Physical Exam  Constitutional: She appears well-developed and well-nourished.  Well appearing. No trismus, drooling. Patient able to handle secretions well. Patent airway.  HENT:    Head: Normocephalic and atraumatic.  Nose: Nose normal.  TTP to tooth number 17. Possible tooth fracture. Patient with otherwise normal dentition. No redness or swelling around tooth or gums. Nontender to palpation to gums. No signs of dental abscess.  Eyes: Conjunctivae and EOM are normal.  Neck: Normal range of motion.  No lymphadenopathy noted. No swelling noted to neck.   Cardiovascular: Normal rate.   Pulmonary/Chest: Effort normal. No respiratory distress.  Normal work of breathing. No respiratory distress noted.   Abdominal: Soft.  Musculoskeletal: Normal range of motion.  Lymphadenopathy:    She has no cervical adenopathy.  Neurological: She is alert.  Skin: Skin is warm.  Psychiatric: She has a normal mood and affect. Her behavior is normal.  Nursing note and vitals reviewed.  ED Treatments / Results  DIAGNOSTIC STUDIES: Oxygen Saturation is 99% on room air, normal by my interpretation.    COORDINATION OF CARE: 2:36 PM Discussed treatment plan with pt at bedside and pt agreed to plan. Pt denies dental block.  Labs (all labs ordered are listed, but only abnormal results are displayed) Labs Reviewed - No data to display  EKG  EKG Interpretation None       Radiology No results found.  Procedures Procedures (including critical care time)  Medications Ordered in ED Medications  penicillin v potassium (VEETID) tablet 500 mg (not administered)  naproxen (NAPROSYN) tablet 500 mg (not administered)     Initial Impression / Assessment and Plan / ED Course  I have reviewed the triage vital signs and the nursing notes.  Pertinent labs & imaging results that were available during my care of the patient were reviewed by me and considered in my medical decision making (see chart for details).    Patient with dentalgia.  No abscess requiring immediate incision and drainage.  Exam not concerning for Ludwig's angina or pharyngeal abscess.  Denies dental block. Will  treat with Penicillin. Pt instructed to follow-up with dentist.  Discussed return precautions. Pt safe for discharge.    Final Clinical Impressions(s) / ED Diagnoses   Final diagnoses:  Pain, dental    New Prescriptions New Prescriptions   PENICILLIN V POTASSIUM (VEETID) 500 MG TABLET    Take 1 tablet (500 mg total) by mouth 4 (four) times daily.   By signing my name below, I, Phillips Climes, attest that this documentation has been prepared under the direction and in the presence of Farryn Linares, [place]. Electronically Signed: Phillips Climes, Scribe. 01/25/2017. 2:36 PM.  I personally performed the services described in this documentation, which was scribed in my presence. The recorded information has been reviewed and is accurate.   [address] [place], [place] 01/25/17 1447    Canary Brim  Tegeler, MD 01/25/17 [id number]

## 2017-05-06 ENCOUNTER — Encounter (HOSPITAL_COMMUNITY): Payer: Self-pay | Admitting: Emergency Medicine

## 2017-05-06 ENCOUNTER — Ambulatory Visit (HOSPITAL_COMMUNITY)
Admission: EM | Admit: 2017-05-06 | Discharge: 2017-05-06 | Disposition: A | Discharge status: Home / Self Care | Payer: BLUE CROSS/BLUE SHIELD | Attending: Internal Medicine | Admitting: Internal Medicine

## 2017-05-06 DIAGNOSIS — J00 Acute nasopharyngitis [common cold]: Secondary | ICD-10-CM | POA: Diagnosis not present

## 2017-05-06 MED ORDER — PHENOL 1.4 % MT LIQD
1.0000 | OROMUCOSAL | 0 refills | Status: DC | PRN
Start: 2017-05-06 — End: 2017-09-01

## 2017-05-06 MED ORDER — FLUTICASONE PROPIONATE 50 MCG/ACT NA SUSP: 2.0000 | Freq: Every day | NASAL | 0 refills | Status: DC

## 2017-05-06 NOTE — ED Triage Notes (Signed)
[facility] with a complaint of a cough, sore throat and body aches x 2 days.

## 2017-05-06 NOTE — Discharge Instructions (Signed)
You have a viral illness. Continue to use decongestions to help with nasal congestion. Flonase for nasal congestion. Phenol throat spray for sore throat. Wait 30 minutes after spray to eat/drink as it can stunt your gag reflex. Keep hydrated, your urine should be clear/pale yellow color. Take Tylenol/Motrin for pain and fever. Monitor for worsening of symptoms, trouble breathing, trouble swallowing, shortness of breath, chest pain, to follow-up for reevaluation.

## 2017-05-06 NOTE — ED Provider Notes (Signed)
CSN: [id number]     Arrival date & time 05/06/17  1455 History   None    Chief Complaint  Patient presents with  . Cough  . Sore Throat   (Consider location/radiation/quality/duration/timing/severity/associated sxs/prior Treatment) 31 year old female comes in for 2 day history of cough, congestion, sore throat, headache. Started with sneezing and rhinorrhea 2 days ago, and has since worsened. Denies fever, chills, night sweats. Denies abdominal pain, nausea, vomiting, diarrhea. Denies ear pain, eye pain. Denies chest pain, wheezing, shortness of breath. Take an over-the-counter sinus medication without relief. No sick contact.       Past Medical History:  Diagnosis Date  . Gonorrhea   . Headache(784.0)   . Infection    UTI  . Shoulder dystocia, delivered, current hospitalization 06/14/2014  . Vaginal Pap smear, abnormal    f/u ok   Past Surgical History:  Procedure Laterality Date  . CESAREAN SECTION WITH BILATERAL TUBAL LIGATION Bilateral 07/20/2016   Procedure: REPEAT CESAREAN SECTION WITH BILATERAL TUBAL LIGATION;  Surgeon: Jaymes GraffNaima Dillard, MD;  Location: [facility];  Service: Obstetrics;  Laterality: Bilateral;  Provider requests RNFA -ap  . NO PAST SURGERIES     Family History  Problem Relation Age of Onset  . Hearing loss Neg Hx    Social History  Substance Use Topics  . Smoking status: Former Games developermoker  . Smokeless tobacco: Former NeurosurgeonUser     Comment: not every day smoker, prior to the preg  . Alcohol use No   OB History    Gravida Para Term Preterm AB Living   4 2 2   2 2    SAB TAB Ectopic Multiple Live Births   2     0 2     Review of Systems  Reason unable to perform ROS: See HPI as above.    Allergies  Patient has no known allergies.  Home Medications   Prior to Admission medications   Medication Sig Start Date End Date Taking? Authorizing Provider  fluticasone (FLONASE) 50 MCG/ACT nasal spray Place 2 sprays into both nostrils daily. 05/06/17   Cathie HoopsYu,  Rolland Steinert V, PA-C  phenol (CHLORASEPTIC) 1.4 % LIQD Use as directed 1 spray in the mouth or throat as needed for throat irritation / pain. 05/06/17   Belinda FisherYu, Chezney Huether V, PA-C   Meds Ordered and Administered this Visit  Medications - No data to display  BP 123/88 (BP Location: Right Arm)   Pulse (!) 105   Temp 99.4 F (37.4 C) (Oral)   Resp 18   SpO2 99%  No data found.   Physical Exam  Constitutional: She is oriented to person, place, and time. She appears well-developed and well-nourished. No distress.  HENT:  Head: Normocephalic and atraumatic.  Right Ear: External ear and ear canal normal. Tympanic membrane is not erythematous and not bulging. A middle ear effusion is present.  Left Ear: External ear and ear canal normal. Tympanic membrane is not erythematous and not bulging. A middle ear effusion is present.  Nose: Right sinus exhibits maxillary sinus tenderness and frontal sinus tenderness. Left sinus exhibits maxillary sinus tenderness and frontal sinus tenderness.  Mouth/Throat: Uvula is midline and mucous membranes are normal. Posterior oropharyngeal erythema present. No posterior oropharyngeal edema. No tonsillar exudate.  Eyes: Pupils are equal, round, and reactive to light. Conjunctivae are normal.  Neck: Normal range of motion. Neck supple.  Cardiovascular: Normal rate, regular rhythm and normal heart sounds.  Exam reveals no gallop and no friction rub.  No murmur heard. Pulmonary/Chest: Effort normal and breath sounds normal. She has no decreased breath sounds. She has no wheezes. She has no rhonchi. She has no rales.  Lymphadenopathy:    She has no cervical adenopathy.  Neurological: She is alert and oriented to person, place, and time.  Skin: Skin is warm and dry.  Psychiatric: She has a normal mood and affect. Her behavior is normal. Judgment normal.    Urgent Care Course     Procedures (including critical care time)  Labs Review Labs Reviewed - No data to display  Imaging  Review No results found.        MDM   1. Acute nasopharyngitis    Discussed with patient history and exam most consistent with viral URI. Start Flonase for nasal congestion. Phenol throat spray for sore throat. Patient to continue over-the-counter decongestions for nasal congestion, consider nasal saline wash. Push fluids. Tylenol/Motrin for pain and fever. Monitor for worsening of symptoms, chest pain, shortness of breath, wheezing, to follow-up for reevaluation.   Belinda FisherYu, Jami Ohlin V, PA-C 05/06/17 [phone]

## 2017-05-08 ENCOUNTER — Ambulatory Visit (HOSPITAL_COMMUNITY)
Admission: EM | Admit: 2017-05-08 | Discharge: 2017-05-08 | Discharge status: Against Medical Advice | Payer: BLUE CROSS/BLUE SHIELD

## 2017-05-08 NOTE — ED Notes (Signed)
No answer

## 2017-05-13 ENCOUNTER — Encounter (HOSPITAL_COMMUNITY): Payer: Self-pay | Admitting: Family Medicine

## 2017-05-13 ENCOUNTER — Ambulatory Visit (HOSPITAL_COMMUNITY)
Admission: EM | Admit: 2017-05-13 | Discharge: 2017-05-13 | Disposition: A | Discharge status: Home / Self Care | Payer: BLUE CROSS/BLUE SHIELD | Attending: Family Medicine | Admitting: Family Medicine

## 2017-05-13 DIAGNOSIS — J209 Acute bronchitis, unspecified: Secondary | ICD-10-CM

## 2017-05-13 MED ORDER — AZITHROMYCIN 250 MG PO TABS: 250.0000 mg | ORAL_TABLET | Freq: Every day | ORAL | 0 refills | Status: DC

## 2017-05-13 MED ORDER — PREDNISONE 50 MG PO TABS: ORAL_TABLET | ORAL | 0 refills | Status: DC

## 2017-05-13 MED ORDER — GUAIFENESIN-CODEINE 100-10 MG/5ML PO SOLN: 5.0000 mL | Freq: Three times a day (TID) | ORAL | 0 refills | Status: DC | PRN

## 2017-05-13 NOTE — ED Provider Notes (Signed)
  [facility]   [id number] 05/13/17 Arrival Time: 1457  ASSESSMENT & PLAN:  1. Acute bronchitis, unspecified organism     Meds ordered this encounter  Medications  . azithromycin (ZITHROMAX) 250 MG tablet    Sig: Take 1 tablet (250 mg total) by mouth daily. Take first 2 tablets together, then 1 every day until finished.    Dispense:  6 tablet    Refill:  0    Order Specific Question:   Supervising Provider    Answer:   Mardella LaymanHAGLER, BRIAN [id number]]  . predniSONE (DELTASONE) 50 MG tablet    Sig: Take 1 tablet daily with food    Dispense:  5 tablet    Refill:  0    Order Specific Question:   Supervising Provider    AnswerMardella Layman:   HAGLER, BRIAN [[id number]]  . guaiFENesin-codeine 100-10 MG/5ML syrup    Sig: Take 5 mLs by mouth 3 (three) times daily as needed for cough.    Dispense:  120 mL    Refill:  0    Order Specific Question:   Supervising Provider    Answer:   Mardella LaymanHAGLER, BRIAN [[id number]]    Reviewed expectations re: course of current medical issues. Questions answered. Outlined signs and symptoms indicating need for more acute intervention. Patient verbalized understanding. After Visit Summary given.   SUBJECTIVE:  [name] [name] is a 31 y.o. female who presents with complaint of Cough that has been productive for more than 1 week. She was seen here one week ago, diagnosed with sinusitis, given Flonase encouraged take allergy medicines, states that the medicine helped for a while and then she began to have worsened symptoms. Denies any fever or chills, shortness of breath or wheezing, no nausea or vomiting and no diarrhea. States sputum was initially yellow and has changed to green. Denies any other significant history  ROS: As per HPI, remainder of ROS negative.   OBJECTIVE:  Vitals:   05/13/17 1535  BP: 130/82  Pulse: 77  Resp: 18  Temp: 98 F (36.7 C)  SpO2: 100%     General appearance: alert; no distress HEENT: normocephalic; atraumatic; conjunctivae normal;  Has submandibular lymphadenopathy, tonsillar lymphadenopathy, oropharynx clear without erythema or edema, no tonsillar exudate. No sinus tenderness Neck: No cervical lymphadenopathy Lungs: clear to auscultation bilaterally Heart: regular rate and rhythm Abdomen: soft, non-tender; bowel sounds normal; no masses or organomegaly; no guarding or rebound tenderness Back: no CVA tenderness Extremities: no cyanosis or edema; symmetrical with no gross deformities Skin: warm and dry Neurologic: Grossly normal Psychological:  alert and cooperative; normal mood and affect  Procedures:     Labs Reviewed - No data to display  No results found.  No Known Allergies  PMHx, SurgHx, SocialHx, Medications, and Allergies were reviewed in the Visit Navigator and updated as appropriate.       Dorena BodoKennard, Johany Hansman, NP 05/13/17 [phone]

## 2017-05-13 NOTE — ED Triage Notes (Signed)
Pt here for cough, headache x 1 week. Was seen here last week and has been out of work all week due to inability to speak on the phone.

## 2017-05-13 NOTE — Discharge Instructions (Signed)
I am treating you for bronchitis. I have prescribed Azithromycin. Take 2 tablets today, then 1 tablet daily till finished. I have also prescribed a steroid called prednisone. Take one tablet daily with food. I have also prescribed a medicine for cough called Cheratussin, this medicine is a narcotic, it will cause drowsiness, and it is addictive. Do not take more than what is necessary, do not drink alcohol while taking, and do not operate any heavy machinery while taking this medicine.  Should your symptoms fail to improve or worsen, follow up with your primary care provider, or return to clinic.  °

## 2017-07-25 ENCOUNTER — Emergency Department (HOSPITAL_COMMUNITY): Payer: BLUE CROSS/BLUE SHIELD

## 2017-07-25 ENCOUNTER — Encounter (HOSPITAL_COMMUNITY): Payer: Self-pay | Admitting: Radiology

## 2017-07-25 ENCOUNTER — Emergency Department (HOSPITAL_COMMUNITY)
Admission: EM | Admit: 2017-07-25 | Discharge: 2017-07-25 | Disposition: A | Discharge status: Home / Self Care | Payer: BLUE CROSS/BLUE SHIELD | Attending: Emergency Medicine | Admitting: Emergency Medicine

## 2017-07-25 DIAGNOSIS — J40 Bronchitis, not specified as acute or chronic: Secondary | ICD-10-CM

## 2017-07-25 DIAGNOSIS — J209 Acute bronchitis, unspecified: Secondary | ICD-10-CM | POA: Diagnosis not present

## 2017-07-25 DIAGNOSIS — R1084 Generalized abdominal pain: Secondary | ICD-10-CM | POA: Diagnosis not present

## 2017-07-25 DIAGNOSIS — R05 Cough: Secondary | ICD-10-CM | POA: Diagnosis present

## 2017-07-25 DIAGNOSIS — Z87891 Personal history of nicotine dependence: Secondary | ICD-10-CM | POA: Diagnosis not present

## 2017-07-25 LAB — URINALYSIS, ROUTINE W REFLEX MICROSCOPIC
BILIRUBIN URINE: NEGATIVE
GLUCOSE, UA: NEGATIVE mg/dL
Hgb urine dipstick: NEGATIVE
KETONES UR: NEGATIVE mg/dL
Leukocytes, UA: NEGATIVE
NITRITE: NEGATIVE
PH: 7 (ref 5.0–8.0)
Protein, ur: NEGATIVE mg/dL
SPECIFIC GRAVITY, URINE: 1.009 (ref 1.005–1.030)

## 2017-07-25 LAB — COMPREHENSIVE METABOLIC PANEL
ALBUMIN: 4.6 g/dL (ref 3.5–5.0)
ALT: 19 U/L (ref 14–54)
ANION GAP: 10 (ref 5–15)
AST: 26 U/L (ref 15–41)
Alkaline Phosphatase: 53 U/L (ref 38–126)
BUN: 13 mg/dL (ref 6–20)
CHLORIDE: 103 mmol/L (ref 101–111)
CO2: 26 mmol/L (ref 22–32)
Calcium: 10 mg/dL (ref 8.9–10.3)
Creatinine, Ser: 0.77 mg/dL (ref 0.44–1.00)
GFR calc Af Amer: >60 mL/min (ref >60)
GFR calc non Af Amer: >60 mL/min (ref >60)
Glucose, Bld: 93 mg/dL (ref 65–99)
POTASSIUM: 4.3 mmol/L (ref 3.5–5.1)
SODIUM: 139 mmol/L (ref 135–145)
TOTAL PROTEIN: 8.4 g/dL — AB (ref 6.5–8.1)
Total Bilirubin: 0.6 mg/dL (ref 0.3–1.2)

## 2017-07-25 LAB — CBC
HEMATOCRIT: 40.4 % (ref 36.0–46.0)
HEMOGLOBIN: 13.8 g/dL (ref 12.0–15.0)
MCH: 29.1 pg (ref 26.0–34.0)
MCHC: 34.2 g/dL (ref 30.0–36.0)
MCV: 85.2 fL (ref 78.0–100.0)
Platelets: 216 10*3/uL (ref 150–400)
RBC: 4.74 MIL/uL (ref 3.87–5.11)
RDW: 12.6 % (ref 11.5–15.5)
WBC: 7.6 10*3/uL (ref 4.0–10.5)

## 2017-07-25 LAB — POC URINE PREG, ED: Preg Test, Ur: NEGATIVE

## 2017-07-25 LAB — LIPASE, BLOOD: LIPASE: 39 U/L (ref 11–51)

## 2017-07-25 MED ORDER — IPRATROPIUM-ALBUTEROL 0.5-2.5 (3) MG/3ML IN SOLN
3.0000 mL | Freq: Once | RESPIRATORY_TRACT | Status: AC
  Administered 2017-07-25: 3 mL via RESPIRATORY_TRACT
  Filled 2017-07-25: qty 3

## 2017-07-25 MED ORDER — PREDNISONE 20 MG PO TABS: 40.0000 mg | ORAL_TABLET | Freq: Every day | ORAL | 0 refills | Status: DC

## 2017-07-25 MED ORDER — IOPAMIDOL (ISOVUE-300) INJECTION 61%
INTRAVENOUS | Status: AC
  Administered 2017-07-25: 100 mL
  Filled 2017-07-25: qty 100

## 2017-07-25 MED ORDER — ALBUTEROL SULFATE HFA 108 (90 BASE) MCG/ACT IN AERS
2.0000 | INHALATION_SPRAY | Freq: Once | RESPIRATORY_TRACT | Status: AC
  Administered 2017-07-25: 2 via RESPIRATORY_TRACT
  Filled 2017-07-25: qty 6.7

## 2017-07-25 MED ORDER — PREDNISONE 20 MG PO TABS
60.0000 mg | ORAL_TABLET | Freq: Once | ORAL | Status: AC
  Administered 2017-07-25: 60 mg via ORAL
  Filled 2017-07-25: qty 3

## 2017-07-25 MED ORDER — BENZONATATE 100 MG PO CAPS
200.0000 mg | ORAL_CAPSULE | Freq: Once | ORAL | Status: AC
  Administered 2017-07-25: 200 mg via ORAL
  Filled 2017-07-25: qty 2

## 2017-07-25 MED ORDER — BENZONATATE 100 MG PO CAPS: 100.0000 mg | ORAL_CAPSULE | Freq: Three times a day (TID) | ORAL | 0 refills | Status: DC

## 2017-07-25 NOTE — Discharge Instructions (Signed)
Inhaler two puffs every 4 hrs. Prednisone as prescribed until all gone. Tessalon for cough. Follow up with family doctor as needed. Return if worsening symptoms.

## 2017-07-25 NOTE — ED Provider Notes (Signed)
[facility]-EMERGENCY DEPT Provider Note   CSN: [id number] Arrival date & time: 07/25/17  1239     History   Chief Complaint Chief Complaint  Patient presents with  . Cough  . Abdominal Pain    HPI [name] [name] is a 31 y.o. female.  HPI [name] [name] is a 31 y.o. female presents to ED with complaint of cough and abdominal cramping. Pt states coughing started 3 wks ago. States her family has been sick with the same. Reports cough is productive with yellow and green sputum. Recent diagnosis of bronchitis 2 months ago, treated with ...   Pt states she has also developed abdominal cramping yesterday. Pain is sharp, periumbilical. Denies N/V/D. No fever. Taking over the counter mucinex and tussin with no relief.   Past Medical History:  Diagnosis Date  . Gonorrhea   . Headache(784.0)   . Infection    UTI  . Shoulder dystocia, delivered, current hospitalization 06/14/2014  . Vaginal Pap smear, abnormal    f/u ok    Patient Active Problem List   Diagnosis Date Noted  . History of shoulder dystocia in prior pregnancy 07/20/2016  . Positive GBS test 07/20/2016  . 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 Delivery of first pregnancy by vacuum extraction 07/20/2016  . Abnormal glucose level (1hr = 138); did not undergo 3hr GTT; Hbg A1C normal. 07/20/2016  . Late prenatal care - 14.1 wks 07/20/2016  . S/P primary low transverse C-section 07/20/2016  . S/P cesarean section 07/20/2016  . History of miscarriage 07/20/2013    Past Surgical History:  Procedure Laterality Date  . CESAREAN SECTION WITH BILATERAL TUBAL LIGATION Bilateral 07/20/2016   Procedure: REPEAT CESAREAN SECTION WITH BILATERAL TUBAL LIGATION;  Surgeon: Jaymes Graff, MD;  [facility]: [facility];  Service: Obstetrics;  Laterality: Bilateral;  Provider requests RNFA -ap  . NO PAST SURGERIES      OB History    Gravida Para Term Preterm AB Living   4 2 2   2 2    SAB TAB Ectopic  Multiple Live Births   2     0 2       Home Medications    Prior to Admission medications   Medication Sig Start Date End Date Taking? Authorizing Provider  azithromycin (ZITHROMAX) 250 MG tablet Take 1 tablet (250 mg total) by mouth daily. Take first 2 tablets together, then 1 every day until finished. 05/13/17   Dorena Bodo, NP  fluticasone (FLONASE) 50 MCG/ACT nasal spray Place 2 sprays into both nostrils daily. 05/06/17   Cathie Hoops, Amy V, PA-C  guaiFENesin-codeine 100-10 MG/5ML syrup Take 5 mLs by mouth 3 (three) times daily as needed for cough. 05/13/17   Dorena Bodo, NP  phenol (CHLORASEPTIC) 1.4 % LIQD Use as directed 1 spray in the mouth or throat as needed for throat irritation / pain. 05/06/17   Belinda Fisher, PA-C  predniSONE (DELTASONE) 50 MG tablet Take 1 tablet daily with food 05/13/17   Dorena Bodo, NP    Family History Family History  Problem Relation Age of Onset  . Hearing loss Neg Hx     Social History Social History  Substance Use Topics  . Smoking status: Former Games developer  . Smokeless tobacco: Former Neurosurgeon     Comment: not every day smoker, prior to the preg  . Alcohol use No     Allergies   Patient has no known allergies.   Review of Systems Review of Systems  Constitutional: Negative for chills and fever.  Respiratory: Positive for cough and wheezing. Negative for chest tightness and shortness of breath.   Cardiovascular: Negative for chest pain, palpitations and leg swelling.  Gastrointestinal: Positive for abdominal pain. Negative for constipation, diarrhea, nausea and vomiting.  Genitourinary: Negative for dysuria, flank pain, pelvic pain, vaginal bleeding, vaginal discharge and vaginal pain.  Musculoskeletal: Negative for arthralgias, myalgias, neck pain and neck stiffness.  Skin: Negative for rash.  Neurological: Negative for dizziness, weakness and headaches.  All other systems reviewed and are negative.    Physical Exam Updated Vital  Signs BP 135/89 (BP Location: Left Arm)   Pulse 82   Temp 97.8 F (36.6 C) (Oral)   Resp 18   Ht 5\' 1"  (1.549 m)   Wt 70.3 kg (155 lb)   LMP 07/07/2017 (Exact Date)   SpO2 97%   BMI 29.29 kg/m   Physical Exam  Constitutional: She appears well-developed and well-nourished. No distress.  HENT:  Head: Normocephalic.  Eyes: Conjunctivae are normal.  Neck: Neck supple.  Cardiovascular: Normal rate, regular rhythm and normal heart sounds.   Pulmonary/Chest: Effort normal. No respiratory distress. She has wheezes. She has no rales.  Ronchi, inspiratory wheezes bilaterally  Abdominal: Soft. Bowel sounds are normal. She exhibits no distension. There is tenderness. There is no rebound.  RLQ tenderness  Musculoskeletal: She exhibits no edema.  Neurological: She is alert.  Skin: Skin is warm and dry.  Psychiatric: She has a normal mood and affect. Her behavior is normal.  Nursing note and vitals reviewed.    ED Treatments / Results  Labs (all labs ordered are listed, but only abnormal results are displayed) Labs Reviewed  COMPREHENSIVE METABOLIC PANEL - Abnormal; Notable for the following:       Result Value   Total Protein 8.4 (*)    All other components within normal limits  URINALYSIS, ROUTINE W REFLEX MICROSCOPIC - Abnormal; Notable for the following:    Color, Urine STRAW (*)    All other components within normal limits  LIPASE, BLOOD  CBC  POC URINE PREG, ED    EKG  EKG Interpretation None       Radiology Dg Chest 2 View  Result Date: 07/25/2017 CLINICAL DATA:  Cough and congestion. EXAM: CHEST  2 VIEW COMPARISON:  07/12/2015 . FINDINGS: Mediastinum and hilar structures normal. Lungs are clear. No focal infiltrate. No pleural effusion or pneumothorax. Heart size normal. IMPRESSION: No acute cardiopulmonary disease. Electronically Signed   By: Maisie Fus  Register   On: 07/25/2017 16:10   Ct Abdomen Pelvis W Contrast  Result Date: 07/25/2017 CLINICAL DATA:   Periumbilical abdominal pain since yesterday. Mild nausea. Productive cough. EXAM: CT ABDOMEN AND PELVIS WITH CONTRAST TECHNIQUE: Multidetector CT imaging of the abdomen and pelvis was performed using the standard protocol following bolus administration of intravenous contrast. CONTRAST:  ISOVUE-300 IOPAMIDOL (ISOVUE-300) INJECTION 61% COMPARISON:  None. FINDINGS: Lower chest: Minimal linear atelectasis or scarring at both lung bases. Hepatobiliary: No focal liver abnormality is seen. No gallstones, gallbladder wall thickening, or biliary dilatation. Pancreas: Unremarkable. No pancreatic ductal dilatation or surrounding inflammatory changes. Spleen: Normal in size without focal abnormality. Adrenals/Urinary Tract: Tiny right renal cysts. Normal appearing left kidney, ureters and urinary bladder. No urinary tract calculi or hydronephrosis. Normal appearing adrenal glands. Stomach/Bowel: Stomach is within normal limits. Appendix appears normal. No evidence of bowel wall thickening, distention, or inflammatory changes. Vascular/Lymphatic: No significant vascular findings are present. No enlarged abdominal or pelvic lymph  nodes. Reproductive: Uterus and bilateral adnexa are unremarkable. Other: Small umbilical hernia containing fat. Musculoskeletal: Sclerosis on the iliac side of the left sacroiliac joint, compatible with osteitis condensans iliac. Small right femoral head bone island. Minimal lower thoracic spine degenerative changes. IMPRESSION: No acute abnormality. Electronically Signed   By: Beckie SaltsSteven  Reid M.D.   On: 07/25/2017 19:03    Procedures Procedures (including critical care time)  Medications Ordered in ED Medications - No data to display   Initial Impression / Assessment and Plan / ED Course  I have reviewed the triage vital signs and the nursing notes.  Pertinent labs & imaging results that were available during my care of the patient were reviewed by me and considered in my medical  decision making (see chart for details).     Pt with cough for 3 weeks and periumbilical pain since last night. Abdomen diffusely tender. Will get labs, CXR, order duoneb and tessalon.    5:06 PM Labs are within normal. Patient is not pregnant. Abdomen reassessed, continues to have severe tenderness with some guarding, states pain worse periumbilical and right lower quadrant. Pain is worse with coughing and movement. At this point I'm unable to rule out appendicitis. Will get CT abdomen and pelvis. Patient continues to cough, will order another breathing treatment.  7:58 PM CT negative. VS remain normal. Question muscular pain from coughing. Plan to dc home with close outpatient follow up. Will start on inhaler for wheezing, prednisone for cough, tessalon perles. Ibuprove/tylenol for pain. Follow up with pcp.   Vitals:   07/25/17 1254 07/25/17 1524 07/25/17 1637 07/25/17 1908  BP: 135/89 (!) 142/89  123/81  Pulse: 82 70  89  Resp: 18 18  18   Temp: 97.8 F (36.6 C)     TempSrc: Oral     SpO2: 97% 96% 99% 100%  Weight: 70.3 kg (155 lb)     Height: 5\' 1"  (1.549 m)       Final Clinical Impressions(s) / ED Diagnoses   Final diagnoses:  Bronchitis  Generalized abdominal pain    New Prescriptions New Prescriptions   BENZONATATE (TESSALON) 100 MG CAPSULE    Take 1 capsule (100 mg total) by mouth every 8 (eight) hours.   PREDNISONE (DELTASONE) 20 MG TABLET    Take 2 tablets (40 mg total) by mouth daily.     Jaynie CrumbleKirichenko, Saphyre Cillo, PA-C 07/25/17 2009    Alvira MondaySchlossman, Erin, MD 07/26/17 1339

## 2017-07-25 NOTE — ED Triage Notes (Signed)
Pt states she has a productive cough and abdominal cramping pain that started last night. Yellow/green sputum. Cough started 3 weeks ago, her daughter had a cough first and her husband and other kids were dx with a URI. She has tried OTC mucinex and tussin without relief. Hx bronchitis 2 months ago.

## 2017-09-01 ENCOUNTER — Encounter (HOSPITAL_COMMUNITY): Payer: Self-pay | Admitting: Emergency Medicine

## 2017-09-01 ENCOUNTER — Ambulatory Visit (HOSPITAL_COMMUNITY)
Admission: EM | Admit: 2017-09-01 | Discharge: 2017-09-01 | Disposition: A | Discharge status: Home / Self Care | Payer: BLUE CROSS/BLUE SHIELD | Attending: Emergency Medicine | Admitting: Emergency Medicine

## 2017-09-01 DIAGNOSIS — J0140 Acute pansinusitis, unspecified: Secondary | ICD-10-CM | POA: Diagnosis not present

## 2017-09-01 MED ORDER — DOXYCYCLINE HYCLATE 100 MG PO CAPS: 100.0000 mg | ORAL_CAPSULE | Freq: Two times a day (BID) | ORAL | 0 refills | Status: AC

## 2017-09-01 MED ORDER — FLUTICASONE PROPIONATE 50 MCG/ACT NA SUSP: 2.0000 | Freq: Every day | NASAL | 0 refills | Status: DC

## 2017-09-01 MED ORDER — IBUPROFEN 600 MG PO TABS: 600.0000 mg | ORAL_TABLET | Freq: Four times a day (QID) | ORAL | 0 refills | Status: DC | PRN

## 2017-09-01 NOTE — Discharge Instructions (Signed)
Take the medication as written. Return if you get worse, have a fever >100.4, or for any concerns. You may take 600 mg of motrin with 1 gram of tylenol up to 3-4 times a day as needed for pain. This is an effective combination for pain.  Most sinus infections are viral and do not need antibiotics unless you have a high fever, have had this for 10 days, or you get better and then get sick again.  Wait for a week to fill the doxycycline.  Use a neti pot or the NeilMed sinus rinse as often as you want to to reduce nasal congestion. Follow the directions on the box.   Below is a list of primary care practices who are taking new patients for you to follow-up with. [facility] [address] [place], [place] [address] [phone]  [facility] [phone] [address] [place] [address]  [facility]: [address] [place] [id number]  [phone]  [facility]: [address] [place] [address]   [phone]  [facility]: [address] [place] [address]  [phone]  [facility] : [address] [place] [id number] [phone]  [facility]: [address] [place] [phone]  [facility]: [address] [place] [address] [phone]  Dr. Oneal GroutMahima Pandey [address] [facility] [address] [facility] [address] [place] [id number]  [phone]  Dr. Jackie PlumGeorge Osei-Bonsu, [facility]. [address] [place], [place] [address]  [phone]  Go to www.goodrx.com to look up your medications. This will give you a list of where you can find your prescriptions at the most affordable prices. Or ask the pharmacist what the cash price is, or if they have any other  discount programs available to help make your medication more affordable. This can be less expensive than what you would pay with insurance.

## 2017-09-01 NOTE — ED Provider Notes (Signed)
HPI  SUBJECTIVE:  [name] [name] is a 31 y.o. female who presents with constant facial/sinus pain, greenish yellow nasal congestion, rhinorrhea, postnasal drip and sore throat for 2 days.  Reports nausea, but no vomiting.  She reports diarrhea yesterday.  Has continued cough unchanged since August. No fevers, ear pain, upper dental pain, facial swelling.  No allergy type symptoms.  She has had multiple sick contacts with similar symptoms.  No antibiotics in the past month.  No antipyretic in the past 6-8 hours.  She tried Tussionex to help her sleep last night, symptoms are better with lying down, no aggravating factors.  She has a past medical history seasonal allergies does not take anything for this regularly, sinusitis.  No history of diabetes, hypertension.  LMP: 11/14.  Denies possibility being pregnant.  PMD: None.   Past Medical History:  Diagnosis Date  . Gonorrhea   . Headache(784.0)   . Infection    UTI  . Shoulder dystocia, delivered, current hospitalization 06/14/2014  . Vaginal Pap smear, abnormal    f/u ok    Past Surgical History:  Procedure Laterality Date  . CESAREAN SECTION WITH BILATERAL TUBAL LIGATION Bilateral 07/20/2016   Procedure: REPEAT CESAREAN SECTION WITH BILATERAL TUBAL LIGATION;  Surgeon: Jaymes GraffNaima Dillard, MD;  Location: [facility];  Service: Obstetrics;  Laterality: Bilateral;  Provider requests RNFA -ap  . NO PAST SURGERIES      Family History  Problem Relation Age of Onset  . Hearing loss Neg Hx     Social History   Tobacco Use  . Smoking status: Former Games developermoker  . Smokeless tobacco: Former NeurosurgeonUser  . Tobacco comment: not every day smoker, prior to the preg  Substance Use Topics  . Alcohol use: No  . Drug use: No    No current facility-administered medications for this encounter.   Current Outpatient Medications:  .  benzonatate (TESSALON) 100 MG capsule, Take 1 capsule (100 mg total) by mouth every 8 (eight) hours., Disp: 21 capsule,  Rfl: 0 .  doxycycline (VIBRAMYCIN) 100 MG capsule, Take 1 capsule (100 mg total) by mouth 2 (two) times daily for 7 days., Disp: 14 capsule, Rfl: 0 .  fluticasone (FLONASE) 50 MCG/ACT nasal spray, Place 2 sprays into both nostrils daily., Disp: 16 g, Rfl: 0 .  ibuprofen (ADVIL,MOTRIN) 600 MG tablet, Take 1 tablet (600 mg total) by mouth every 6 (six) hours as needed., Disp: 30 tablet, Rfl: 0  Allergies  Allergen Reactions  . Pollen Extract Itching     ROS  As noted in HPI.   Physical Exam  BP 113/87 (BP Location: Left Arm)   Pulse 87   Temp 98.3 F (36.8 C) (Oral)   Resp 20   LMP 08/16/2017   SpO2 97%   Breastfeeding? No   Constitutional: Well developed, well nourished, no acute distress Eyes:  EOMI, conjunctiva normal bilaterally HENT: Normocephalic, atraumatic,mucus membranes moist.  TMs normal bilaterally.  Erythematous, swollen turbinates with mucoid nasal congestion.  Positive maxillary frontal sinus tenderness.  Tonsils normal without exudates.  Uvula midline.  Positive cobblestoning and postnasal drip. Respiratory: Normal inspiratory effort. lungs clear bilaterally, good air movement Cardiovascular: Normal rate GI: nondistended skin: No rash, skin intact Musculoskeletal: no deformities Neurologic: Alert & oriented x 3, no focal neuro deficits Psychiatric: Speech and behavior appropriate   ED Course   Medications - No data to display  No orders of the defined types were placed in this encounter.   No results found for  this or any previous visit (from the past 24 hour(s)). No results found.  ED Clinical Impression  Acute pansinusitis, recurrence not specified   ED Assessment/Plan  Previous ER records reviewed.  Last ER visit was 10/23 where patient had cough and abdominal pain.  Patient with a sinusitis, most likely viral.  Advised saline nasal irrigation, Flonase, Mucinex D ibuprofen 600 mg with 1 g of Tylenol.  We will sent home with a wait-and-see  prescription of doxycycline but gave patient clear instructions on when to fill it-by surgery weight another week.  We will also provide work note for today and tomorrow.  Ordering primary care referral and providing primary care list.  Discussed MDM, plan and followup with  patient.  patient agrees with plan.   Meds ordered this encounter  Medications  . doxycycline (VIBRAMYCIN) 100 MG capsule    Sig: Take 1 capsule (100 mg total) by mouth 2 (two) times daily for 7 days.    Dispense:  14 capsule    Refill:  0  . fluticasone (FLONASE) 50 MCG/ACT nasal spray    Sig: Place 2 sprays into both nostrils daily.    Dispense:  16 g    Refill:  0  . ibuprofen (ADVIL,MOTRIN) 600 MG tablet    Sig: Take 1 tablet (600 mg total) by mouth every 6 (six) hours as needed.    Dispense:  30 tablet    Refill:  0    *This clinic note was created using Scientist, clinical (histocompatibility and immunogenetics)Dragon dictation software. Therefore, there may be occasional mistakes despite careful proofreading.   ?    Domenick GongMortenson, Haruko Mersch, MD 09/01/17 1801

## 2017-09-01 NOTE — ED Triage Notes (Signed)
PT C/O: cold sx... [facility]  ONSET: 3 days  SX ALSO INCLUDE: prod cough, facial pressure, chills, ST, diarrhea, nausea, HA  DENIES:   TAKING MEDS: Robitussin   A&O x4... NAD... Ambulatory

## 2017-10-07 ENCOUNTER — Ambulatory Visit (HOSPITAL_COMMUNITY)
Admission: EM | Admit: 2017-10-07 | Discharge: 2017-10-07 | Disposition: A | Discharge status: Home / Self Care | Payer: BLUE CROSS/BLUE SHIELD | Attending: Family Medicine | Admitting: Family Medicine

## 2017-10-07 ENCOUNTER — Encounter (HOSPITAL_COMMUNITY): Payer: Self-pay | Admitting: Emergency Medicine

## 2017-10-07 DIAGNOSIS — R110 Nausea: Secondary | ICD-10-CM

## 2017-10-07 DIAGNOSIS — R519 Headache, unspecified: Secondary | ICD-10-CM

## 2017-10-07 DIAGNOSIS — R51 Headache: Secondary | ICD-10-CM | POA: Diagnosis not present

## 2017-10-07 MED ORDER — DEXAMETHASONE SODIUM PHOSPHATE 10 MG/ML IJ SOLN
10.0000 mg | Freq: Once | INTRAMUSCULAR | Status: AC
  Administered 2017-10-07: 10 mg via INTRAMUSCULAR

## 2017-10-07 MED ORDER — SUMATRIPTAN SUCCINATE 6 MG/0.5ML ~~LOC~~ SOLN
6.0000 mg | Freq: Once | SUBCUTANEOUS | Status: AC
  Administered 2017-10-07: 6 mg via SUBCUTANEOUS

## 2017-10-07 MED ORDER — KETOROLAC TROMETHAMINE 60 MG/2ML IM SOLN
INTRAMUSCULAR | Status: AC
  Filled 2017-10-07: qty 2

## 2017-10-07 MED ORDER — SUMATRIPTAN SUCCINATE 6 MG/0.5ML ~~LOC~~ SOLN
SUBCUTANEOUS | Status: AC
  Filled 2017-10-07: qty 0.5

## 2017-10-07 MED ORDER — KETOROLAC TROMETHAMINE 60 MG/2ML IM SOLN
60.0000 mg | Freq: Once | INTRAMUSCULAR | Status: AC
  Administered 2017-10-07: 60 mg via INTRAMUSCULAR

## 2017-10-07 MED ORDER — DEXAMETHASONE SODIUM PHOSPHATE 10 MG/ML IJ SOLN
INTRAMUSCULAR | Status: AC
  Filled 2017-10-07: qty 1

## 2017-10-07 NOTE — ED Triage Notes (Signed)
PT C/O: constant HA onset 0700 today that increases w/bright light and also feeling nauseas  DENIES: blurred vision, LOC  TAKING MEDS: Aleve w/no relief.   A&O x4... NAD... Ambulatory

## 2017-10-09 NOTE — ED Provider Notes (Signed)
[facility]   [phone] 10/07/17 Arrival Time: 1759  ASSESSMENT & PLAN:  1. Acute nonintractable headache, unspecified headache type     Meds ordered this encounter  Medications  . ketorolac (TORADOL) injection 60 mg  . dexamethasone (DECADRON) injection 10 mg  . SUMAtriptan (IMITREX) injection 6 mg   Observation for the next 12-24 hours. Agrees to proceed to the ED if not improving or if HA significantly worsening. HA precautions given. May f/u here as needed.  Reviewed expectations re: course of current medical issues. Questions answered. Outlined signs and symptoms indicating need for more acute intervention. Patient verbalized understanding. After Visit Summary given.   SUBJECTIVE:  [name] [name] is a 32 y.o. female who presents with complaint of a headache beginning about 7am this morning. Occasionally with similar headaches but "this one lasting longer than normal." Associated photophobia and nausea without emesis. No formal h/o migraine headaches. No visual changes or hearing changes. Ambulatory without difficulty. No extremity sensation changes or weakness. No specific aggravating or alleviating factors reported. Normal fluid intake. No recent illnesses. No new medications. OTC Aleve without relief. In the past headaches last a couple of hours then slowly resolve.  ROS: As per HPI.   OBJECTIVE:  Vitals:   10/07/17 1826  BP: 121/79  Pulse: 73  Resp: 20  Temp: 98.2 F (36.8 C)  TempSrc: Oral  SpO2: 97%    General appearance: alert; no distress; prefers dark room Eyes: PERRLA; EOMI; conjunctiva normal HENT: normocephalic; atraumatic Neck: supple with FROM Lungs: clear to auscultation bilaterally Heart: regular rate and rhythm Extremities: no edema; symmetrical with no gross deformities Skin: warm and dry Neurologic: CN 2-12 grossly intact; normal gait; normal symmetric reflexes; normal extremity strength and sensation throughout Psychological:  alert and cooperative; normal mood and affect  Allergies  Allergen Reactions  . Pollen Extract Itching    Past Medical History:  Diagnosis Date  . Gonorrhea   . Headache(784.0)   . Infection    UTI  . Shoulder dystocia, delivered, current hospitalization 06/14/2014  . Vaginal Pap smear, abnormal    f/u ok   Social History   Socioeconomic History  . Marital status: Married    Spouse name: Not on file  . Number of children: Not on file  . Years of education: Not on file  . Highest education level: Not on file  Social Needs  . Financial resource strain: Not on file  . Food insecurity - worry: Not on file  . Food insecurity - inability: Not on file  . Transportation needs - medical: Not on file  . Transportation needs - non-medical: Not on file  Occupational History  . Not on file  Tobacco Use  . Smoking status: Former Games developermoker  . Smokeless tobacco: Former NeurosurgeonUser  . Tobacco comment: not every day smoker, prior to the preg  Substance and Sexual Activity  . Alcohol use: No  . Drug use: No  . Sexual activity: Yes    Birth control/protection: None  Other Topics Concern  . Not on file  Social History Narrative  . Not on file   Family History  Problem Relation Age of Onset  . Hearing loss Neg Hx    Past Surgical History:  Procedure Laterality Date  . CESAREAN SECTION WITH BILATERAL TUBAL LIGATION Bilateral 07/20/2016   Procedure: REPEAT CESAREAN SECTION WITH BILATERAL TUBAL LIGATION;  Surgeon: Jaymes GraffNaima Dillard, MD;  [facility]: [facility];  Service: Obstetrics;  Laterality: Bilateral;  Provider requests RNFA -ap  .  NO PAST SURGERIES       Mardella Layman, MD 10/09/17 [phone]

## 2017-10-11 ENCOUNTER — Ambulatory Visit: Payer: BLUE CROSS/BLUE SHIELD | Admitting: Family Medicine

## 2017-10-11 ENCOUNTER — Encounter: Payer: Self-pay | Admitting: Family Medicine

## 2017-10-11 ENCOUNTER — Other Ambulatory Visit: Payer: Self-pay

## 2017-10-11 VITALS — BP 129/89 | HR 89 | Temp 99.4°F | Resp 17 | Ht 61.0 in | Wt 151.8 lb

## 2017-10-11 DIAGNOSIS — F331 Major depressive disorder, recurrent, moderate: Secondary | ICD-10-CM | POA: Diagnosis not present

## 2017-10-11 DIAGNOSIS — G43709 Chronic migraine without aura, not intractable, without status migrainosus: Secondary | ICD-10-CM

## 2017-10-11 DIAGNOSIS — Z9851 Tubal ligation status: Secondary | ICD-10-CM | POA: Diagnosis not present

## 2017-10-11 MED ORDER — NORTRIPTYLINE HCL 25 MG PO CAPS: 25.0000 mg | ORAL_CAPSULE | Freq: Every day | ORAL | 1 refills | Status: DC

## 2017-10-11 MED ORDER — SUMATRIPTAN SUCCINATE 25 MG PO TABS: 25.0000 mg | ORAL_TABLET | ORAL | 0 refills | Status: DC | PRN

## 2017-10-11 NOTE — Progress Notes (Signed)
Chief Complaint  Patient presents with  . New Patient (Initial Visit)    cough, bronchitis dx, migraines-taking ibuprofen and it sometimes helps, depression, pt been sick alot and out of work alot due sickness    HPI   She has a history of postpartum depression She states that she was seeing a psychiatrist and therapist and due to payment issues did not get to follow up Her last visit was over June 2018 She was on medications for her depression She has had [id number] She states that she had postparum depression with her second pregnancy  She states that she was treated with medication  She reports that talking to her therapist helped her a lot She reports that she was seeing her Psychiatrist and Therapist every week which helped her the most She would like to get back on medications She reports that her husband helps with the children She is currently working 80 hours when she feels well She works for reservations for [employer]  Depression screen [facility] 2/9 11/15/2017 10/11/2017 03/19/2014  Decreased Interest 3 2 0  Down, Depressed, Hopeless 2 2 0  PHQ - 2 Score 5 4 0  Altered sleeping 3 2 -  Tired, decreased energy 2 3 -  Change in appetite 3 3 -  Feeling bad or failure about yourself  2 2 -  Trouble concentrating 1 0 -  Moving slowly or fidgety/restless 1 0 -  Suicidal thoughts 0 0 -  PHQ-9 Score 17 14 -  Difficult doing work/chores Very difficult Somewhat difficult -    Migraines She has a history of migraine headaches  She reports that sometimes when she wakes up she has pain and photophobia She also gets nausea from it as well She reports frontal lobe right side as her most frequent location No known triggers 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 She states that her first severe migraine was in  2013 She reports that she was on allergy meds in the past    Past Medical History:  Diagnosis Date  . Allergy   . Depression   . Gonorrhea   . Headache(784.0)   . Infection    UTI  . Shoulder dystocia, delivered, current hospitalization 06/14/2014  . Vaginal Pap smear, abnormal    f/u ok    Current Outpatient Medications  Medication Sig Dispense Refill  . fluticasone (FLONASE) 50 MCG/ACT nasal spray Place 2 sprays into both nostrils daily. 16 g 0  . ibuprofen (ADVIL,MOTRIN) 600 MG tablet Take 1 tablet (600 mg total) by mouth every 6 (six) hours as needed. (Patient not taking: Reported on 11/15/2017) 30 tablet 0  . nortriptyline (PAMELOR) 25 MG capsule Take 1 capsule (25 mg total) by mouth at bedtime. 30 capsule 1  . SUMAtriptan (IMITREX) 25 MG tablet Take 1 tablet (25 mg total) by mouth every 2 (two) hours as needed for migraine. May repeat in 2 hours if headache persists or recurs. 30 tablet 0   No current facility-administered medications for this visit.     Allergies:  Allergies  Allergen Reactions  . Pollen Extract Itching    Past Surgical History:  Procedure Laterality Date  . CESAREAN SECTION WITH BILATERAL TUBAL LIGATION Bilateral 07/20/2016   Procedure: REPEAT CESAREAN SECTION WITH BILATERAL TUBAL LIGATION;  Surgeon: Jaymes Graff, MD;  Location: [facility];  Service: Obstetrics;  Laterality: Bilateral;  Provider requests RNFA -ap  . NO PAST SURGERIES      Social History   Socioeconomic History  . Marital status: Married    Spouse name: Not on file  . Number of children: Not on file  . Years of education: Not on file  . Highest education level: Not on file  Occupational History  . Not on file  Social Needs  . Financial resource strain: Not on file  . Food insecurity:    Worry: Not on file    Inability: Not on file  . Transportation needs:    Medical: Not on file    Non-medical: Not on file  Tobacco Use  . Smoking status: Former Games developermoker  .  Smokeless tobacco: Former NeurosurgeonUser  . Tobacco comment: not every day smoker, prior to the preg  Substance and Sexual Activity  . Alcohol use: No  . Drug use: No  . Sexual activity: Yes    Birth control/protection: None  Lifestyle  . Physical activity:    Days per week: Not on file    Minutes per session: Not on file  . Stress: Not on file  Relationships  . Social connections:    Talks on phone: Not on file    Gets together: Not on file    Attends religious service: Not on file    Active member of club or organization: Not on file    Attends meetings of clubs or organizations: Not on file    Relationship status: Not on file  Other Topics Concern  . Not on file  Social History Narrative  . Not on file    Family History  Problem Relation Age of Onset  . Hearing loss Neg Hx      ROS Review of Systems See HPI Constitution: No fevers or chills No malaise No diaphoresis Skin: No rash or itching Eyes: no blurry vision, no double vision GU: no dysuria or hematuria Neuro: no dizziness or headaches all others reviewed and negative   Objective: Vitals:   10/11/17 1524  BP: 129/89  Pulse: 89  Resp: 17  Temp: 99.4 F (37.4 C)  TempSrc: Oral  SpO2: 99%  Weight: 151 lb 12.8 oz (68.9 kg)  Height: 5\' 1"  (1.549 m)    Physical Exam  Constitutional: She is oriented to person, place, and time. She appears well-developed and well-nourished.  HENT:  Head: Normocephalic and atraumatic.  Eyes: Conjunctivae and EOM are normal.  Neck: Normal range of motion. No thyromegaly present.  Cardiovascular: Normal rate, regular rhythm, normal heart sounds and intact distal pulses.  No murmur heard. Pulmonary/Chest: Effort normal and breath sounds normal. No stridor. No respiratory distress. She has no wheezes.  Abdominal: Soft. Bowel sounds are normal. She exhibits no distension and no mass. There is no tenderness. There is no guarding.  Neurological: She is alert and oriented to person,  place, and time.  Skin: Skin is warm. Capillary refill takes less than 2 seconds. No erythema.  Psychiatric: She has a normal mood and affect. Her behavior is normal. Judgment and thought content normal.    Assessment and Plan [name] BarsYan [name] was seen today for new patient (initial visit).  Diagnoses and all orders for this visit:  Moderate episode of recurrent major depressive disorder (HCC)-  Pt advised to try headache management with notriptyline which helps with mood  Hx of tubal ligation  Chronic migraine without aura without status migrainosus, not intractable Advised nortriptyline with sumatriptan for break through -  nortriptyline (PAMELOR) 25 MG capsule; Take 1 capsule (25 mg total) by mouth at bedtime. -     SUMAtriptan (IMITREX) 25 MG tablet; Take 1 tablet (25 mg total) by mouth every 2 (two) hours as needed for migraine. May repeat in 2 hours if headache persists or recurs.     [name] [name] A Yanni Ruberg

## 2017-10-11 NOTE — Patient Instructions (Addendum)
   IF you received an x-ray today, you will receive an invoice from [facility]. Please contact [facility] at [phone] with questions or concerns regarding your invoice.   IF you received labwork today, you will receive an invoice from [facility]. Please contact [facility] at [phone] with questions or concerns regarding your invoice.   Our billing staff will not be able to assist you with questions regarding bills from these companies.  You will be contacted with the lab results as soon as they are available. The fastest way to get your results is to activate your My Chart account. Instructions are located on the last page of this paperwork. If you have not heard from us regarding the results in 2 weeks, please contact this office.     Major Depressive Disorder, Adult Major depressive disorder (MDD) is a mental health condition. It may also be called clinical depression or unipolar depression. MDD usually causes feelings of sadness, hopelessness, or helplessness. MDD can also cause physical symptoms. It can interfere with work, school, relationships, and other everyday activities. MDD may be mild, moderate, or severe. It may occur once (single episode major depressive disorder) or it may occur multiple times (recurrent major depressive disorder). What are the causes? The exact cause of this condition is not known. MDD is most likely caused by a combination of things, which may include:  Genetic factors. These are traits that are passed along from parent to child.  Individual factors. Your personality, your behavior, and the way you handle your thoughts and feelings may contribute to MDD. This includes personality traits and behaviors learned from others.  Physical factors, such as: ? Differences in the part of your brain that controls emotion. This part of your brain may be different than it is in people who do not have MDD. ? Long-term (chronic) medical or  psychiatric illnesses.  Social factors. Traumatic experiences or major life changes may play a role in the development of MDD.  What increases the risk? This condition is more likely to develop in women. The following factors may also make you more likely to develop MDD:  A family history of depression.  Troubled family relationships.  Abnormally low levels of certain brain chemicals.  Traumatic events in childhood, especially abuse or the loss of a parent.  Being under a lot of stress, or long-term stress, especially from upsetting life experiences or losses.  A history of: ? Chronic physical illness. ? Other mental health disorders. ? Substance abuse.  Poor living conditions.  Experiencing social exclusion or discrimination on a regular basis.  What are the signs or symptoms? The main symptoms of MDD typically include:  Constant depressed or irritable mood.  Loss of interest in things and activities.  MDD symptoms may also include:  Sleeping or eating too much or too little.  Unexplained weight change.  Fatigue or low energy.  Feelings of worthlessness or guilt.  Difficulty thinking clearly or making decisions.  Thoughts of suicide or of harming others.  Physical agitation or weakness.  Isolation.  Severe cases of MDD may also occur with other symptoms, such as:  Delusions or hallucinations, in which you imagine things that are not real (psychotic depression).  Low-level depression that lasts at least a year (chronic depression or persistent depressive disorder).  Extreme sadness and hopelessness (melancholic depression).  Trouble speaking and moving (catatonic depression).  How is this diagnosed? This condition may be diagnosed based on:  Your symptoms.  Your medical history, including   your mental health history. This may involve tests to evaluate your mental health. You may be asked questions about your lifestyle, including any drug and alcohol  use, and how long you have had symptoms of MDD.  A physical exam.  Blood tests to rule out other conditions.  You must have a depressed mood and at least four other MDD symptoms most of the day, nearly every day in the same 2-week timeframe before your health care provider can confirm a diagnosis of MDD. How is this treated? This condition is usually treated by mental health professionals, such as psychologists, psychiatrists, and clinical social workers. You may need more than one type of treatment. Treatment may include:  Psychotherapy. This is also called talk therapy or counseling. Types of psychotherapy include: ? Cognitive behavioral therapy (CBT). This type of therapy teaches you to recognize unhealthy feelings, thoughts, and behaviors, and replace them with positive thoughts and actions. ? Interpersonal therapy (IPT). This helps you to improve the way you relate to and communicate with others. ? Family therapy. This treatment includes members of your family.  Medicine to treat anxiety and depression, or to help you control certain emotions and behaviors.  Lifestyle changes, such as: ? Limiting alcohol and drug use. ? Exercising regularly. ? Getting plenty of sleep. ? Making healthy eating choices. ? Spending more time outdoors.  Treatments involving stimulation of the brain can be used in situations with extremely severe symptoms, or when medicine or other therapies do not work over time. These treatments include electroconvulsive therapy, transcranial magnetic stimulation, and vagal nerve stimulation. Follow these instructions at home: Activity  Return to your normal activities as told by your health care provider.  Exercise regularly and spend time outdoors as told by your health care provider. General instructions  Take over-the-counter and prescription medicines only as told by your health care provider.  Do not drink alcohol. If you drink alcohol, limit your alcohol  intake to no more than 1 drink a day for nonpregnant women and 2 drinks a day for men. One drink equals 12 oz of beer, 5 oz of wine, or 1 oz of hard liquor. Alcohol can affect any antidepressant medicines you are taking. Talk to your health care provider about your alcohol use.  Eat a healthy diet and get plenty of sleep.  Find activities that you enjoy doing, and make time to do them.  Consider joining a support group. Your health care provider may be able to recommend a support group.  Keep all follow-up visits as told by your health care provider. This is important. Where to find more information: National Alliance on Mental Illness  www.nami.org  U.S. National Institute of Mental Health  www.nimh.nih.gov  National Suicide Prevention Lifeline  1-800-273-TALK (8255). This is free, 24-hour help.  Contact a health care provider if:  Your symptoms get worse.  You develop new symptoms. Get help right away if:  You self-harm.  You have serious thoughts about hurting yourself or others.  You see, hear, taste, smell, or feel things that are not present (hallucinate). This information is not intended to replace advice given to you by your health care provider. Make sure you discuss any questions you have with your health care provider. Document Released: 01/14/2013 Document Revised: 05/26/2016 Document Reviewed: 03/30/2016 Elsevier Interactive Patient Education  2018 Elsevier Inc.  

## 2017-10-20 ENCOUNTER — Telehealth: Payer: Self-pay | Admitting: Family Medicine

## 2017-10-20 NOTE — Telephone Encounter (Signed)
CRM for notification. See Telephone encounter for:   10/20/17.   Relation to pt: self Call back number: [phone]    Reason for call:  Patient last seen by Doristine BosworthStallings, Zoe A, MD  on 10/11/17, patient checking on the status of FMLA paperwork, patient dropped off paperwork at the time of appointment. Patient was seen in the ED 10/07/17 and requested PCP to backdate to 10/04/17. Deadline was 10/19/17 patient states her job is on the line, please advise

## 2017-10-23 NOTE — Telephone Encounter (Signed)
I have not seen any FMLA forms for this patient if she brought for her OV then im guessing she gave them straight to the provider--I will send this to Dr Creta LevinStallings but I have not seen anything for this patient.  Dr Creta LevinStallings do you have this patients FMLA forms or do you know where they may be she is stating she gave them to you at her OV on 10/07/17 they have not been put on my desk or in my box so I have not seen them.

## 2017-10-24 ENCOUNTER — Telehealth: Payer: Self-pay

## 2017-10-24 NOTE — Telephone Encounter (Signed)
Do we know the status of this?  Copied from CRM [phone]. Topic: Quick Communication - See Telephone Encounter >> Oct 20, 2017  1:25 PM Elliot GaultBell, Tiffany M wrote: CRM for notification. See Telephone encounter for:   10/20/17.   Relation to pt: self Call back number: [phone]    Reason for call:  Patient last seen by Doristine BosworthStallings, Zoe A, MD  on 10/11/17, patient checking on the status of FMLA paperwork, patient dropped off paperwork at the time of appointment. Patient was seen in the ED 10/07/17 and requested PCP to backdate to 10/04/17. Deadline was 10/19/17 patient states her job is on the line, please advise >> Oct 24, 2017 11:38 AM Percival SpanishKennedy, Cheryl W wrote:  Pt call to check up on FMLA paperwork and is asking if it is ready   [phone]

## 2017-10-24 NOTE — Telephone Encounter (Signed)
I completed the paperwork and had a copy faxed at the time of the visit. [name] keeps a copy in a pink folder at her desk.  I do not have it anywhere else.  If she can get another copy then I could send one in but please check on 104 desk where [name] keeps the fax confirmations in a pink folder.

## 2017-10-24 NOTE — Telephone Encounter (Signed)
Left message on voicemail to return call ([name]). Dgaddy, CMA

## 2017-10-24 NOTE — Telephone Encounter (Signed)
Pt returning the call of [name], contact pt when possible

## 2017-10-25 NOTE — Telephone Encounter (Signed)
Spoke with pt will bring forms to office for us to complete today. Dgaddy, CMA

## 2017-10-25 NOTE — Telephone Encounter (Signed)
Left message on voicemail to return my call ([name]). dgaddy

## 2017-10-25 NOTE — Telephone Encounter (Signed)
Please follow up

## 2017-10-25 NOTE — Telephone Encounter (Signed)
Please advise on paperwork

## 2017-10-25 NOTE — Telephone Encounter (Signed)
Spoke with patient via phone and she will bring form to office today for completion. Dgaddy, CMA

## 2017-10-26 NOTE — Telephone Encounter (Signed)
Paperwork scanned in and faxed on 10/26/17

## 2017-10-26 NOTE — Telephone Encounter (Signed)
Please make sure that all FMLA/DISABILITY paperwork that is given to a provider during an OV is given to me to be processed--we have a certain way that we have to complete these forms and process them along with a fee for this.   Please make sure when paperwork is brought it that is given to me.  Thank you!

## 2017-10-30 NOTE — Telephone Encounter (Signed)
Pt picked up paperwork and faxed backed. Dgaddy, CMA

## 2017-11-14 NOTE — Progress Notes (Signed)
Chief Complaint  Patient presents with  . re evaluation of attention    4 week followup    HPI   Migraines She was keeping a headache log  She reports that she had 5 major headache/migraine days and 2 tension headache days She states that there is no consistent trigger for her headaches She states that the headache seems to get relief with the sumatriptan She states that she usually gets relief with one dose.   Attention She reports that she has difficulty finishing tasks She states that she wakes up and gets her kids ready She has not been back to work in quite a while She starts something and won't finish She feels a little overwhelmed This has been ongoing for two weeks now This has been impacting her with all tasks   Depression screen [facility] 2/9 11/15/2017 10/11/2017 03/19/2014  Decreased Interest 3 2 0  Down, Depressed, Hopeless 2 2 0  PHQ - 2 Score 5 4 0  Altered sleeping 3 2 -  Tired, decreased energy 2 3 -  Change in appetite 3 3 -  Feeling bad or failure about yourself  2 2 -  Trouble concentrating 1 0 -  Moving slowly or fidgety/restless 1 0 -  Suicidal thoughts 0 0 -  PHQ-9 Score 17 14 -  Difficult doing work/chores Very difficult Somewhat difficult -    GAD 7 : Generalized Anxiety Score 11/15/2017  Nervous, Anxious, on Edge 2  Control/stop worrying 3  Worry too much - different things 3  Trouble relaxing 2  Restless 2  Easily annoyed or irritable 3  Afraid - awful might happen 1  Total GAD 7 Score 16  Anxiety Difficulty Somewhat difficult    She denies panic attacks She feels like her inability to complete tasks makes it very hard to work She has been off work from 10/30/17 due to funeral and has not gone back since She is dreading going back to work since there have been changes at work She has trouble completing all tasks    Past Medical History:  Diagnosis Date  . Allergy   . Depression   . Gonorrhea   . Headache(784.0)   . Infection    UTI    . Shoulder dystocia, delivered, current hospitalization 06/14/2014  . Vaginal Pap smear, abnormal    f/u ok    Current Outpatient Medications  Medication Sig Dispense Refill  . fluticasone (FLONASE) 50 MCG/ACT nasal spray Place 2 sprays into both nostrils daily. 16 g 0  . nortriptyline (PAMELOR) 25 MG capsule Take 1 capsule (25 mg total) by mouth at bedtime. 30 capsule 1  . ibuprofen (ADVIL,MOTRIN) 600 MG tablet Take 1 tablet (600 mg total) by mouth every 6 (six) hours as needed. (Patient not taking: Reported on 11/15/2017) 30 tablet 0  . SUMAtriptan (IMITREX) 25 MG tablet Take 1 tablet (25 mg total) by mouth every 2 (two) hours as needed for migraine. May repeat in 2 hours if headache persists or recurs. 30 tablet 0   No current facility-administered medications for this visit.     Allergies:  Allergies  Allergen Reactions  . Pollen Extract Itching    Past Surgical History:  Procedure Laterality Date  . CESAREAN SECTION WITH BILATERAL TUBAL LIGATION Bilateral 07/20/2016   Procedure: REPEAT CESAREAN SECTION WITH BILATERAL TUBAL LIGATION;  Surgeon: Jaymes Graff, MD;  Location: [facility];  Service: Obstetrics;  Laterality: Bilateral;  Provider requests RNFA -ap  . NO PAST SURGERIES  Social History   Socioeconomic History  . Marital status: Married    Spouse name: None  . Number of children: None  . Years of education: None  . Highest education level: None  Social Needs  . Financial resource strain: None  . Food insecurity - worry: None  . Food insecurity - inability: None  . Transportation needs - medical: None  . Transportation needs - non-medical: None  Occupational History  . None  Tobacco Use  . Smoking status: Former Games developermoker  . Smokeless tobacco: Former NeurosurgeonUser  . Tobacco comment: not every day smoker, prior to the preg  Substance and Sexual Activity  . Alcohol use: No  . Drug use: No  . Sexual activity: Yes    Birth control/protection: None  Other  Topics Concern  . None  Social History Narrative  . None    Family History  Problem Relation Age of Onset  . Hearing loss Neg Hx      ROS Review of Systems See HPI Constitution: No fevers or chills No malaise No diaphoresis Skin: No rash or itching Eyes: no blurry vision, no double vision GU: no dysuria or hematuria Neuro: no dizziness or headaches  all others reviewed and negative   Objective: Vitals:   11/15/17 0939  BP: 113/81  Pulse: 91  Resp: 16  Temp: 98.2 F (36.8 C)  TempSrc: Oral  SpO2: 99%  Weight: 149 lb 6.4 oz (67.8 kg)  Height: 5\' 1"  (1.549 m)    Physical Exam  Constitutional: She is oriented to person, place, and time. She appears well-developed and well-nourished.  HENT:  Head: Normocephalic and atraumatic.  Eyes: Conjunctivae and EOM are normal.  Neck: Normal range of motion. Neck supple.  Cardiovascular: Normal rate, regular rhythm and normal heart sounds.  No murmur heard. Pulmonary/Chest: Effort normal and breath sounds normal. No stridor. No respiratory distress. She has no wheezes.  Neurological: She is alert and oriented to person, place, and time.  Skin: Skin is warm. Capillary refill takes less than 2 seconds.  Psychiatric: She has a normal mood and affect. Her behavior is normal. Judgment and thought content normal.    Assessment and Plan Seymour BarsYan Long was seen today for re evaluation of attention.  Diagnoses and all orders for this visit:  Moderate episode of recurrent major depressive disorder (HCC) Generalized anxiety disorder Attention deficit disorder (ADD) without hyperactivity -     Ambulatory referral to Psychiatry  Discussed that the patient should see Psychiatry for diagnosis as her attention deficit can be a manifestation of untreated depression and anxiety No family history of bipolar and schizophrenia She is unable to work thus will get the referral faxed today  Chronic migraine without aura without status migrainosus,  not intractable   Improved duration with imitrex Frequency of daily headaches unchanged Continue imitrex with nsaids prn   Flu vaccine need -     Flu Vaccine QUAD 36+ mos IM   A total of 30 minutes were spent face-to-face with the patient during this encounter and over half of that time was spent on counseling and coordination of care.   Nic Lampe A Madex Seals

## 2017-11-15 ENCOUNTER — Encounter: Payer: Self-pay | Admitting: Family Medicine

## 2017-11-15 ENCOUNTER — Ambulatory Visit: Payer: BLUE CROSS/BLUE SHIELD | Admitting: Family Medicine

## 2017-11-15 ENCOUNTER — Other Ambulatory Visit: Payer: Self-pay

## 2017-11-15 VITALS — BP 113/81 | HR 91 | Temp 98.2°F | Resp 16 | Ht 61.0 in | Wt 149.4 lb

## 2017-11-15 DIAGNOSIS — F331 Major depressive disorder, recurrent, moderate: Secondary | ICD-10-CM

## 2017-11-15 DIAGNOSIS — Z23 Encounter for immunization: Secondary | ICD-10-CM | POA: Diagnosis not present

## 2017-11-15 DIAGNOSIS — G43709 Chronic migraine without aura, not intractable, without status migrainosus: Secondary | ICD-10-CM

## 2017-11-15 DIAGNOSIS — F988 Other specified behavioral and emotional disorders with onset usually occurring in childhood and adolescence: Secondary | ICD-10-CM

## 2017-11-15 DIAGNOSIS — F411 Generalized anxiety disorder: Secondary | ICD-10-CM

## 2017-11-15 NOTE — Patient Instructions (Addendum)
   IF you received an x-ray today, you will receive an invoice from [facility]. Please contact [facility] at [phone] with questions or concerns regarding your invoice.   IF you received labwork today, you will receive an invoice from [facility]. Please contact [facility] at [phone] with questions or concerns regarding your invoice.   Our billing staff will not be able to assist you with questions regarding bills from these companies.  You will be contacted with the lab results as soon as they are available. The fastest way to get your results is to activate your My Chart account. Instructions are located on the last page of this paperwork. If you have not heard from us regarding the results in 2 weeks, please contact this office.     Generalized Anxiety Disorder, Adult Generalized anxiety disorder (GAD) is a mental health disorder. People with this condition constantly worry about everyday events. Unlike normal anxiety, worry related to GAD is not triggered by a specific event. These worries also do not fade or get better with time. GAD interferes with life functions, including relationships, work, and school. GAD can vary from mild to severe. People with severe GAD can have intense waves of anxiety with physical symptoms (panic attacks). What are the causes? The exact cause of GAD is not known. What increases the risk? This condition is more likely to develop in:  Women.  People who have a family history of anxiety disorders.  People who are very shy.  People who experience very stressful life events, such as the death of a loved one.  People who have a very stressful family environment.  What are the signs or symptoms? People with GAD often worry excessively about many things in their lives, such as their health and family. They may also be overly concerned about:  Doing well at work.  Being on time.  Natural disasters.  Friendships.  Physical  symptoms of GAD include:  Fatigue.  Muscle tension or having muscle twitches.  Trembling or feeling shaky.  Being easily startled.  Feeling like your heart is pounding or racing.  Feeling out of breath or like you cannot take a deep breath.  Having trouble falling asleep or staying asleep.  Sweating.  Nausea, diarrhea, or irritable bowel syndrome (IBS).  Headaches.  Trouble concentrating or remembering facts.  Restlessness.  Irritability.  How is this diagnosed? Your health care provider can diagnose GAD based on your symptoms and medical history. You will also have a physical exam. The health care provider will ask specific questions about your symptoms, including how severe they are, when they started, and if they come and go. Your health care provider may ask you about your use of alcohol or drugs, including prescription medicines. Your health care provider may refer you to a mental health specialist for further evaluation. Your health care provider will do a thorough examination and may perform additional tests to rule out other possible causes of your symptoms. To be diagnosed with GAD, a person must have anxiety that:  Is out of his or her control.  Affects several different aspects of his or her life, such as work and relationships.  Causes distress that makes him or her unable to take part in normal activities.  Includes at least three physical symptoms of GAD, such as restlessness, fatigue, trouble concentrating, irritability, muscle tension, or sleep problems.  Before your health care provider can confirm a diagnosis of GAD, these symptoms must be present more days than   they are not, and they must last for six months or longer. How is this treated? The following therapies are usually used to treat GAD:  Medicine. Antidepressant medicine is usually prescribed for long-term daily control. Antianxiety medicines may be added in severe cases, especially when panic  attacks occur.  Talk therapy (psychotherapy). Certain types of talk therapy can be helpful in treating GAD by providing support, education, and guidance. Options include: ? Cognitive behavioral therapy (CBT). People learn coping skills and techniques to ease their anxiety. They learn to identify unrealistic or negative thoughts and behaviors and to replace them with positive ones. ? Acceptance and commitment therapy (ACT). This treatment teaches people how to be mindful as a way to cope with unwanted thoughts and feelings. ? Biofeedback. This process trains you to manage your body's response (physiological response) through breathing techniques and relaxation methods. You will work with a therapist while machines are used to monitor your physical symptoms.  Stress management techniques. These include yoga, meditation, and exercise.  A mental health specialist can help determine which treatment is best for you. Some people see improvement with one type of therapy. However, other people require a combination of therapies. Follow these instructions at home:  Take over-the-counter and prescription medicines only as told by your health care provider.  Try to maintain a normal routine.  Try to anticipate stressful situations and allow extra time to manage them.  Practice any stress management or self-calming techniques as taught by your health care provider.  Do not punish yourself for setbacks or for not making progress.  Try to recognize your accomplishments, even if they are small.  Keep all follow-up visits as told by your health care provider. This is important. Contact a health care provider if:  Your symptoms do not get better.  Your symptoms get worse.  You have signs of depression, such as: ? A persistently sad, cranky, or irritable mood. ? Loss of enjoyment in activities that used to bring you joy. ? Change in weight or eating. ? Changes in sleeping habits. ? Avoiding friends  or family members. ? Loss of energy for normal tasks. ? Feelings of guilt or worthlessness. Get help right away if:  You have serious thoughts about hurting yourself or others. If you ever feel like you may hurt yourself or others, or have thoughts about taking your own life, get help right away. You can go to your nearest emergency department or call:  Your local emergency services (911 in the [place]  A suicide crisis helpline, such as the National Suicide Prevention Lifeline at 1-800-273-8255. This is open 24 hours a day.  Summary  Generalized anxiety disorder (GAD) is a mental health disorder that involves worry that is not triggered by a specific event.  People with GAD often worry excessively about many things in their lives, such as their health and family.  GAD may cause physical symptoms such as restlessness, trouble concentrating, sleep problems, frequent sweating, nausea, diarrhea, headaches, and trembling or muscle twitching.  A mental health specialist can help determine which treatment is best for you. Some people see improvement with one type of therapy. However, other people require a combination of therapies. This information is not intended to replace advice given to you by your health care provider. Make sure you discuss any questions you have with your health care provider. Document Released: 01/14/2013 Document Revised: 08/09/2016 Document Reviewed: 08/09/2016 Elsevier Interactive Patient Education  2018 Elsevier Inc.  

## 2017-12-18 ENCOUNTER — Telehealth: Payer: Self-pay | Admitting: Family Medicine

## 2017-12-18 NOTE — Telephone Encounter (Signed)
Called pt to let pt know that her referral for psychiatry was sent to [facility] on 2/25 has to leave a vm..Marland Kitchen

## 2017-12-28 ENCOUNTER — Ambulatory Visit: Payer: BLUE CROSS/BLUE SHIELD | Admitting: Family Medicine

## 2017-12-28 ENCOUNTER — Encounter: Payer: Self-pay | Admitting: Family Medicine

## 2017-12-28 ENCOUNTER — Other Ambulatory Visit: Payer: Self-pay

## 2017-12-28 VITALS — BP 108/80 | HR 87 | Temp 99.0°F | Resp 17 | Ht 61.0 in | Wt 148.4 lb

## 2017-12-28 DIAGNOSIS — G43709 Chronic migraine without aura, not intractable, without status migrainosus: Secondary | ICD-10-CM | POA: Diagnosis not present

## 2017-12-28 DIAGNOSIS — F331 Major depressive disorder, recurrent, moderate: Secondary | ICD-10-CM | POA: Diagnosis not present

## 2017-12-28 DIAGNOSIS — IMO0002 Reserved for concepts with insufficient information to code with codable children: Secondary | ICD-10-CM

## 2017-12-28 DIAGNOSIS — Z5181 Encounter for therapeutic drug level monitoring: Secondary | ICD-10-CM | POA: Diagnosis not present

## 2017-12-28 MED ORDER — NORTRIPTYLINE HCL 50 MG PO CAPS: 50.0000 mg | ORAL_CAPSULE | Freq: Every day | ORAL | 1 refills | Status: DC

## 2017-12-28 NOTE — Patient Instructions (Signed)
     IF you received an x-ray today, you will receive an invoice from [facility]. Please contact [facility] at [phone] with questions or concerns regarding your invoice.   IF you received labwork today, you will receive an invoice from [facility]. Please contact [facility] at [phone] with questions or concerns regarding your invoice.   Our billing staff will not be able to assist you with questions regarding bills from these companies.  You will be contacted with the lab results as soon as they are available. The fastest way to get your results is to activate your My Chart account. Instructions are located on the last page of this paperwork. If you have not heard from us regarding the results in 2 weeks, please contact this office.     

## 2017-12-28 NOTE — Progress Notes (Signed)
Chief Complaint  Patient presents with  . FMLA paperwork    completion    HPI   Depression and Migraines Patient reports that the Pamelor is not helping her headaches She seems to have worsening headaches before her periods but reports that it is becoming a weekly thing She reports that imitrex works 75% of the time to reduce the duration of the headaches She states that she is also not sleeping well with the pamelor before bedtime She denies slurred speech, motor weakness She reports photophobia Her headaches are still in the frontal lobe She denies any side effects of the nortriptyline or the imitrex.  She states that she feels that she has some external stressors that are worsening her depression as well She reports that her financial stress is overwhelming her and she feels very stressed and tense She does not have bad side effects from the nortriptyline and would be open to an increase in the doseage  Depression screen [facility] 2/9 12/28/2017 11/15/2017 10/11/2017 03/19/2014  Decreased Interest 2 3 2  0  Down, Depressed, Hopeless 3 2 2  0  PHQ - 2 Score 5 5 4  0  Altered sleeping 3 3 2  -  Tired, decreased energy 3 2 3  -  Change in appetite 2 3 3  -  Feeling bad or failure about yourself  2 2 2  -  Trouble concentrating 2 1 0 -  Moving slowly or fidgety/restless 2 1 0 -  Suicidal thoughts 0 0 0 -  PHQ-9 Score 19 17 14  -  Difficult doing work/chores Very difficult Very difficult Somewhat difficult -      Past Medical History:  Diagnosis Date  . Allergy   . Depression   . Gonorrhea   . Headache(784.0)   . Infection    UTI  . Shoulder dystocia, delivered, current hospitalization 06/14/2014  . Vaginal Pap smear, abnormal    f/u ok    Current Outpatient Medications  Medication Sig Dispense Refill  . fluticasone (FLONASE) 50 MCG/ACT nasal spray Place 2 sprays into both nostrils daily. 16 g 0  . nortriptyline (PAMELOR) 50 MG capsule Take 1 capsule (50 mg total) by mouth daily.  30 capsule 1  . SUMAtriptan (IMITREX) 25 MG tablet Take 1 tablet (25 mg total) by mouth every 2 (two) hours as needed for migraine. May repeat in 2 hours if headache persists or recurs. 30 tablet 0   No current facility-administered medications for this visit.     Allergies:  Allergies  Allergen Reactions  . Pollen Extract Itching    Past Surgical History:  Procedure Laterality Date  . CESAREAN SECTION WITH BILATERAL TUBAL LIGATION Bilateral 07/20/2016   Procedure: REPEAT CESAREAN SECTION WITH BILATERAL TUBAL LIGATION;  Surgeon: Jaymes Graff, MD;  Location: [facility];  Service: Obstetrics;  Laterality: Bilateral;  Provider requests RNFA -ap  . NO PAST SURGERIES      Social History   Socioeconomic History  . Marital status: Married    Spouse name: Not on file  . Number of children: Not on file  . Years of education: Not on file  . Highest education level: Not on file  Occupational History  . Not on file  Social Needs  . Financial resource strain: Not on file  . Food insecurity:    Worry: Not on file    Inability: Not on file  . Transportation needs:    Medical: Not on file    Non-medical: Not on file  Tobacco Use  . Smoking  status: Former Games developermoker  . Smokeless tobacco: Former NeurosurgeonUser  . Tobacco comment: not every day smoker, prior to the preg  Substance and Sexual Activity  . Alcohol use: No  . Drug use: No  . Sexual activity: Yes    Birth control/protection: None  Lifestyle  . Physical activity:    Days per week: Not on file    Minutes per session: Not on file  . Stress: Not on file  Relationships  . Social connections:    Talks on phone: Not on file    Gets together: Not on file    Attends religious service: Not on file    Active member of club or organization: Not on file    Attends meetings of clubs or organizations: Not on file    Relationship status: Not on file  Other Topics Concern  . Not on file  Social History Narrative  . Not on file     Family History  Problem Relation Age of Onset  . Hearing loss Neg Hx      ROS Review of Systems See HPI Constitution: No fevers or chills No malaise No diaphoresis Skin: No rash or itching Eyes: no blurry vision, no double vision GU: no dysuria or hematuria Neuro: no dizziness, +headaches  all others reviewed and negative   Objective: Vitals:   12/28/17 0808  BP: 108/80  Pulse: 87  Resp: 17  Temp: 99 F (37.2 C)  TempSrc: Oral  SpO2: 98%  Weight: 148 lb 6.4 oz (67.3 kg)  Height: 5\' 1"  (1.549 m)    Physical Exam  Constitutional: She is oriented to person, place, and time. She appears well-developed and well-nourished.  HENT:  Head: Normocephalic and atraumatic.  Eyes: Conjunctivae and EOM are normal.  Cardiovascular: Normal rate, regular rhythm and normal heart sounds.  No murmur heard. Pulmonary/Chest: Effort normal and breath sounds normal. No stridor. No respiratory distress.  Neurological: She is alert and oriented to person, place, and time. No cranial nerve deficit.  Skin: Skin is warm. Capillary refill takes less than 2 seconds.  Psychiatric: She has a normal mood and affect. Her behavior is normal. Judgment and thought content normal.     ECG for drug monitoring NSR, no arrhythmia   Assessment and Plan Seymour BarsYan Long was seen today for fmla paperwork.  Diagnoses and all orders for this visit:  Moderate episode of recurrent major depressive disorder (HCC)-  Discussed the benefit of Psychiatry as she has some anxiety, depression and focus issues Increased pamelor dose today -     Ambulatory referral to Psychiatry  Chronic migraine- advised pt to follow up with Neurology -     Ambulatory referral to Neurology  Other orders -     nortriptyline (PAMELOR) 50 MG capsule; Take 1 capsule (50 mg total) by mouth daily.     Ronold Hardgrove A [place]

## 2018-01-06 ENCOUNTER — Other Ambulatory Visit: Payer: Self-pay

## 2018-01-06 ENCOUNTER — Emergency Department (HOSPITAL_COMMUNITY)
Admission: EM | Admit: 2018-01-06 | Discharge: 2018-01-06 | Disposition: A | Discharge status: Home / Self Care | Payer: BLUE CROSS/BLUE SHIELD | Attending: Emergency Medicine | Admitting: Emergency Medicine

## 2018-01-06 ENCOUNTER — Emergency Department (HOSPITAL_COMMUNITY): Payer: BLUE CROSS/BLUE SHIELD

## 2018-01-06 ENCOUNTER — Encounter (HOSPITAL_COMMUNITY): Payer: Self-pay

## 2018-01-06 DIAGNOSIS — J069 Acute upper respiratory infection, unspecified: Secondary | ICD-10-CM | POA: Diagnosis not present

## 2018-01-06 DIAGNOSIS — Z87891 Personal history of nicotine dependence: Secondary | ICD-10-CM | POA: Diagnosis not present

## 2018-01-06 DIAGNOSIS — R6889 Other general symptoms and signs: Secondary | ICD-10-CM

## 2018-01-06 DIAGNOSIS — R059 Cough, unspecified: Secondary | ICD-10-CM

## 2018-01-06 DIAGNOSIS — R05 Cough: Secondary | ICD-10-CM

## 2018-01-06 MED ORDER — ALBUTEROL SULFATE HFA 108 (90 BASE) MCG/ACT IN AERS
2.0000 | INHALATION_SPRAY | Freq: Once | RESPIRATORY_TRACT | Status: AC
  Administered 2018-01-06: 2 via RESPIRATORY_TRACT
  Filled 2018-01-06: qty 6.7

## 2018-01-06 NOTE — ED Provider Notes (Signed)
[facility]-EMERGENCY DEPT Provider Note   CSN: [id number] Arrival date & time: 01/06/18  1715     History   Chief Complaint Chief Complaint  Patient presents with  . Cough  . Sore Throat  . Nasal Congestion  . Generalized Body Aches    HPI [name] [name] is a 32 y.o. female with a PMHx of depression, headaches, recurrent UTIs, and other conditions listed below, who presents to the ED with complaints of URI symptoms that began about 8 days ago.  Patient states that she had bronchitis twice last year, once in August and once in October, and ever since then she has had a persistent cough.  About 8 days ago she started having increase in her cough with yellowish greenish sputum production, as well as associated sneezing, nasal congestion, yellow rhinorrhea, body aches, chills, nausea, sore throat, and wheezing.  She has been trying Flonase, cough drops, and Tussin with no relief of her symptoms, being outside seems to aggravate her symptoms.  No known sick contacts although she states that she has several children that have allergies at home and have had persistent cough for several months as well related to their allergies.  She is a non-smoker.  She denies any fevers, ear pain or drainage, drooling, trismus, chest pain, shortness of breath, abdominal pain, vomiting, diarrhea, constipation, dysuria, hematuria, arthralgias, numbness, tingling, focal weakness, or any other complaints at this time.  She is not currently on a daily antihistamine.   The history is provided by the patient and medical records. No language interpreter was used.  Cough  Associated symptoms include chills, rhinorrhea, sore throat, myalgias (body aches) and wheezing. Pertinent negatives include no chest pain, no ear pain and no shortness of breath.  Sore Throat  Pertinent negatives include no chest pain, no abdominal pain and no shortness of breath.  URI   This is a new problem. The current  episode started more than 1 week ago. The problem has not changed since onset.There has been no fever. Associated symptoms include nausea, congestion, rhinorrhea, sneezing, sore throat, cough and wheezing. Pertinent negatives include no chest pain, no abdominal pain, no diarrhea, no vomiting, no dysuria and no ear pain. She has tried other medications for the symptoms. The treatment provided no relief.    Past Medical History:  Diagnosis Date  . Allergy   . Depression   . Gonorrhea   . Headache(784.0)   . Infection    UTI  . Shoulder dystocia, delivered, current hospitalization 06/14/2014  . Vaginal Pap smear, abnormal    f/u ok    Patient Active Problem List   Diagnosis Date Noted  . Hx of tubal ligation 10/11/2017  . 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 S/P primary low transverse C-section 07/20/2016  . S/P cesarean section 07/20/2016  . History of miscarriage 07/20/2013    Past Surgical History:  Procedure Laterality Date  . CESAREAN SECTION WITH BILATERAL TUBAL LIGATION Bilateral 07/20/2016   Procedure: REPEAT CESAREAN SECTION WITH BILATERAL TUBAL LIGATION;  Surgeon: Jaymes Graff, MD;  Location: [facility];  Service: Obstetrics;  Laterality: Bilateral;  Provider requests RNFA -ap  . NO PAST SURGERIES       OB History    Gravida  4   Para  2   Term  2   Preterm      AB  2   Living  2     SAB  2  TAB      Ectopic      Multiple  0   Live Births  2            Home Medications    Prior to Admission medications   Medication Sig Start Date End Date Taking? Authorizing Provider  fluticasone (FLONASE) 50 MCG/ACT nasal spray Place 2 sprays into both nostrils daily. 09/01/17   Domenick Gong, MD  nortriptyline (PAMELOR) 50 MG capsule Take 1 capsule (50 mg total) by mouth daily. 12/28/17   Doristine Bosworth, MD  SUMAtriptan (IMITREX) 25 MG tablet Take 1 tablet (25 mg total) by mouth every 2 (two) hours as  needed for migraine. May repeat in 2 hours if headache persists or recurs. 10/11/17   Doristine Bosworth, MD    Family History Family History  Problem Relation Age of Onset  . Hearing loss Neg Hx     Social History Social History   Tobacco Use  . Smoking status: Former Games developer  . Smokeless tobacco: Former Neurosurgeon  . Tobacco comment: not every day smoker, prior to the preg  Substance Use Topics  . Alcohol use: No  . Drug use: No     Allergies   Pollen extract   Review of Systems Review of Systems  Constitutional: Positive for chills. Negative for fever.  HENT: Positive for congestion, rhinorrhea, sneezing and sore throat. Negative for drooling, ear discharge, ear pain and trouble swallowing.   Respiratory: Positive for cough and wheezing. Negative for shortness of breath.   Cardiovascular: Negative for chest pain.  Gastrointestinal: Positive for nausea. Negative for abdominal pain, constipation, diarrhea and vomiting.  Genitourinary: Negative for dysuria and hematuria.  Musculoskeletal: Positive for myalgias (body aches). Negative for arthralgias.  Skin: Negative for color change.  Allergic/Immunologic: Negative for immunocompromised state.  Neurological: Negative for weakness and numbness.  Psychiatric/Behavioral: Negative for confusion.   All other systems reviewed and are negative for acute change except as noted in the HPI.    Physical Exam Updated Vital Signs BP (!) 128/96 (BP Location: Right Arm)   Pulse 98   Temp 98.3 F (36.8 C) (Oral)   Resp 18   Ht 5\' 1"  (1.549 m)   Wt 67.1 kg (148 lb)   LMP 12/26/2017   SpO2 99%   BMI 27.96 kg/m   Physical Exam  Constitutional: She is oriented to person, place, and time. Vital signs are normal. She appears well-developed and well-nourished.  Non-toxic appearance. No distress.  Afebrile, nontoxic, NAD  HENT:  Head: Normocephalic and atraumatic.  Nose: Mucosal edema and rhinorrhea present.  Mouth/Throat: Uvula is midline  and mucous membranes are normal. No trismus in the jaw. No uvula swelling. Posterior oropharyngeal erythema present. No oropharyngeal exudate, posterior oropharyngeal edema or tonsillar abscesses. Tonsils are 0 on the right. Tonsils are 0 on the left. No tonsillar exudate.  Nose mildly congested with clear rhinorrhea. Oropharynx mildly injected, without uvular swelling or deviation, no trismus or drooling, no tonsillar swelling or exudates.  No PTA.   Eyes: Conjunctivae and EOM are normal. Right eye exhibits no discharge. Left eye exhibits no discharge.  Neck: Normal range of motion. Neck supple.  Cardiovascular: Normal rate, regular rhythm, normal heart sounds and intact distal pulses. Exam reveals no gallop and no friction rub.  No murmur heard. Pulmonary/Chest: Effort normal and breath sounds normal. No respiratory distress. She has no decreased breath sounds. She has no wheezes. She has no rhonchi. She has no rales.  Dry cough  noted during most of evaluation. Lungs CTAB in all lung fields, no w/r/r, no hypoxia or increased WOB, speaking in full sentences, SpO2 99% on RA  Abdominal: Soft. Normal appearance and bowel sounds are normal. She exhibits no distension. There is no tenderness. There is no rigidity, no rebound, no guarding, no CVA tenderness, no tenderness at McBurney's point and negative Murphy's sign.  Musculoskeletal: Normal range of motion.  Neurological: She is alert and oriented to person, place, and time. She has normal strength. No sensory deficit.  Skin: Skin is warm, dry and intact. No rash noted.  Psychiatric: She has a normal mood and affect.  Nursing note and vitals reviewed.    ED Treatments / Results  Labs (all labs ordered are listed, but only abnormal results are displayed) Labs Reviewed - No data to display  EKG None  Radiology Dg Chest 2 View  Result Date: 01/06/2018 CLINICAL DATA:  Nasal congestion for 8 days EXAM: CHEST - 2 VIEW COMPARISON:  July 25, 2017  FINDINGS: The heart size and mediastinal contours are within normal limits. Both lungs are clear. The visualized skeletal structures are unremarkable. IMPRESSION: No active cardiopulmonary disease. Electronically Signed   By: Sherian ReinWei-Chen  Lin M.D.   On: 01/06/2018 20:10    Procedures Procedures (including critical care time)  Medications Ordered in ED Medications  albuterol (PROVENTIL HFA;VENTOLIN HFA) 108 (90 Base) MCG/ACT inhaler 2 puff (2 puffs Inhalation Given 01/06/18 1938)     Initial Impression / Assessment and Plan / ED Course  I have reviewed the triage vital signs and the nursing notes.  Pertinent labs & imaging results that were available during my care of the patient were reviewed by me and considered in my medical decision making (see chart for details).     32 y.o. female here with cough/URI symptoms x8 days, states she's has a persistent cough since August but 8 days ago started having sneezing/congestion/etc. On exam, clear lungs, dry cough noted throughout exam, mild nasal congestion, mild throat injection but no tonsillar swelling/exudates/PTA. Will get CXR and give inhaler to see if this helps with her persistent coughing fits, discussed that the cough she's had for months could be either allergies vs silent reflux and recommended daily antihistamines as well as zantac. Will reassess after CXR.   8:17 PM CXR negative. Symptoms likely viral illness vs allergic URI illness. Advised OTC remedies for symptomatic relief, will send home with the inhaler as well. Advised antihistamines and antacid meds as well. F/up with PCP in 1wk for recheck. I explained the diagnosis and have given explicit precautions to return to the ER including for any other new or worsening symptoms. The patient understands and accepts the medical plan as it's been dictated and I have answered their questions. Discharge instructions concerning home care and prescriptions have been given. The patient is STABLE and  is discharged to home in good condition.    Final Clinical Impressions(s) / ED Diagnoses   Final diagnoses:  Upper respiratory tract infection, unspecified type  Cough  Flu-like symptoms    ED Discharge Orders    [address]None       Ayaka Andes, ClaytonMercedes, New JerseyPA-C 01/06/18 2017    Pricilla LovelessGoldston, Scott, MD 01/06/18 2306

## 2018-01-06 NOTE — Discharge Instructions (Addendum)
Continue to stay well-hydrated. Gargle warm salt water and spit it out and use chloraseptic spray as needed for sore throat. Continue to alternate between Tylenol and Ibuprofen for pain or fever. Use Mucinex for cough suppression/expectoration of mucus. Use netipot and flonase to help with nasal congestion. May consider over-the-counter Benadryl or other antihistamine (like claritin, zyrtec, allegra, etc) to decrease secretions and for help with your symptoms. Use inhaler as directed, as needed for cough/chest congestion/wheezing/shortness of breath. You may also consider using over the counter zantac to help with any silent reflux that you're having which could contribute to having a persistent cough. Follow up with your primary care doctor in 5-7 days for recheck of ongoing symptoms. Return to emergency department for emergent changing or worsening of symptoms.

## 2018-01-06 NOTE — ED Triage Notes (Signed)
Patient reports having nasal congestion 8 days ago, cough 7 days ago and last night began having a productive cough with yellow/green sputum, body aches, and increased nasal and chest congestion.

## 2018-01-08 ENCOUNTER — Other Ambulatory Visit: Payer: Self-pay | Admitting: Family Medicine

## 2018-01-09 NOTE — Telephone Encounter (Signed)
Imitrex refill request  LOV 12/28/17 with Dr. Creta LevinStallings  [facility] [address] - [place], [place] - [phone]  Nurse triage not approve refills for this per protocol.

## 2018-01-11 NOTE — Telephone Encounter (Signed)
Sumatriptan 25 mg #9 with 0 refills approved. Dgaddy, CMA

## 2018-01-25 ENCOUNTER — Encounter: Payer: Self-pay | Admitting: Family Medicine

## 2018-01-25 ENCOUNTER — Ambulatory Visit: Payer: BLUE CROSS/BLUE SHIELD | Admitting: Family Medicine

## 2018-01-25 ENCOUNTER — Other Ambulatory Visit: Payer: Self-pay

## 2018-01-25 VITALS — BP 132/91 | HR 87 | Temp 98.4°F | Resp 17 | Ht 61.0 in | Wt 145.4 lb

## 2018-01-25 DIAGNOSIS — IMO0002 Reserved for concepts with insufficient information to code with codable children: Secondary | ICD-10-CM

## 2018-01-25 DIAGNOSIS — N912 Amenorrhea, unspecified: Secondary | ICD-10-CM

## 2018-01-25 DIAGNOSIS — G43709 Chronic migraine without aura, not intractable, without status migrainosus: Secondary | ICD-10-CM

## 2018-01-25 DIAGNOSIS — F411 Generalized anxiety disorder: Secondary | ICD-10-CM

## 2018-01-25 DIAGNOSIS — F5101 Primary insomnia: Secondary | ICD-10-CM | POA: Diagnosis not present

## 2018-01-25 LAB — POCT URINE PREGNANCY: Preg Test, Ur: NEGATIVE

## 2018-01-25 MED ORDER — SUMATRIPTAN SUCCINATE 25 MG PO TABS: 25.0000 mg | ORAL_TABLET | Freq: Once | ORAL | 6 refills | Status: DC

## 2018-01-25 MED ORDER — NORTRIPTYLINE HCL 50 MG PO CAPS: 50.0000 mg | ORAL_CAPSULE | Freq: Every day | ORAL | 3 refills | Status: DC

## 2018-01-25 NOTE — Progress Notes (Signed)
Chief Complaint  Patient presents with  . migraines/mood    f/u one month    HPI  Anxiety and Depression Last visit on 12/28/17, she was advised to follow up with Psychiatry for there anxiety and depression as well as her focus issues as she could have attention deficit compounded by anxiety and depression  Or just undiagnosed attention deficit disorder  She reports that she has been more focused now that her headaches are better Her work shift is now a morning shift which is better She reports that she has trouble with sleep and has insomnia She states that she was told that she snores but admits that she hears all conversations and every sound while she is sleeping.  Depression screen [facility] 2/9 01/25/2018 12/28/2017 11/15/2017 10/11/2017 03/19/2014  Decreased Interest 0 2 3 2  0  Down, Depressed, Hopeless 1 3 2 2  0  PHQ - 2 Score 1 5 5 4  0  Altered sleeping - 3 3 2  -  Tired, decreased energy - 3 2 3  -  Change in appetite - 2 3 3  -  Feeling bad or failure about yourself  - 2 2 2  -  Trouble concentrating - 2 1 0 -  Moving slowly or fidgety/restless - 2 1 0 -  Suicidal thoughts - 0 0 0 -  PHQ-9 Score - 19 17 14  -  Difficult doing work/chores - Very difficult Very difficult Somewhat difficult -   GAD 7 : Generalized Anxiety Score 12/28/2017 11/15/2017  Nervous, Anxious, on Edge 3 2  Control/stop worrying 3 3  Worry too much - different things 3 3  Trouble relaxing 3 2  Restless 3 2  Easily annoyed or irritable 3 3  Afraid - awful might happen 3 1  Total GAD 7 Score 21 16  Anxiety Difficulty Somewhat difficult Somewhat difficult      Chronic migraines  She was referred to Neurology for her chronic migraines She was increased to Pamelor 50mg  She reports that she has gone 2 weeks with a headache since her Nortriptyline was increased to 50mg   She needs a refill of the Imitrex  Missed menses Patient reports that she gets a montly period cycle She states that she has not had a  period since March 15 She reports that her periods are like clockwork She reports she has been without her usual pms signs Patient's last menstrual period was 12/15/2017.    Past Medical History:  Diagnosis Date  . Allergy   . Depression   . Gonorrhea   . Headache(784.0)   . Infection    UTI  . Shoulder dystocia, delivered, current hospitalization 06/14/2014  . Vaginal Pap smear, abnormal    f/u ok    Current Outpatient Medications  Medication Sig Dispense Refill  . fluticasone (FLONASE) 50 MCG/ACT nasal spray Place 2 sprays into both nostrils daily. 16 g 0  . nortriptyline (PAMELOR) 50 MG capsule Take 1 capsule (50 mg total) by mouth daily. 30 capsule 3  . SUMAtriptan (IMITREX) 25 MG tablet Take 1 tablet (25 mg total) by mouth once for 1 dose. May repeat in 2 hours if headache persists or recurs. 10 tablet 6   No current facility-administered medications for this visit.     Allergies:  Allergies  Allergen Reactions  . Pollen Extract Itching    Past Surgical History:  Procedure Laterality Date  . CESAREAN SECTION WITH BILATERAL TUBAL LIGATION Bilateral 07/20/2016   Procedure: REPEAT CESAREAN SECTION WITH BILATERAL TUBAL LIGATION;  Surgeon:  Jaymes Graff, MD;  Location: [facility];  Service: Obstetrics;  Laterality: Bilateral;  Provider requests RNFA -ap  . NO PAST SURGERIES      Social History   Socioeconomic History  . Marital status: Married    Spouse name: Not on file  . Number of children: Not on file  . Years of education: Not on file  . Highest education level: Not on file  Occupational History  . Not on file  Social Needs  . Financial resource strain: Not on file  . Food insecurity:    Worry: Not on file    Inability: Not on file  . Transportation needs:    Medical: Not on file    Non-medical: Not on file  Tobacco Use  . Smoking status: Former Games developer  . Smokeless tobacco: Former Neurosurgeon  . Tobacco comment: not every day smoker, prior to the  preg  Substance and Sexual Activity  . Alcohol use: No  . Drug use: No  . Sexual activity: Yes    Birth control/protection: None  Lifestyle  . Physical activity:    Days per week: Not on file    Minutes per session: Not on file  . Stress: Not on file  Relationships  . Social connections:    Talks on phone: Not on file    Gets together: Not on file    Attends religious service: Not on file    Active member of club or organization: Not on file    Attends meetings of clubs or organizations: Not on file    Relationship status: Not on file  Other Topics Concern  . Not on file  Social History Narrative  . Not on file    Family History  Problem Relation Age of Onset  . Hearing loss Neg Hx      ROS Review of Systems See HPI Constitution: No fevers or chills No malaise No diaphoresis Skin: No rash or itching Eyes: no blurry vision, no double vision GU: no dysuria or hematuria Neuro: no dizziness or headaches all others reviewed and negative   Objective: Vitals:   01/25/18 0827  BP: (!) 132/91  Pulse: 87  Resp: 17  Temp: 98.4 F (36.9 C)  TempSrc: Oral  SpO2: 98%  Weight: 145 lb 6.4 oz (66 kg)  Height: 5\' 1"  (1.549 m)    Physical Exam  Constitutional: She is oriented to person, place, and time. She appears well-developed and well-nourished.  HENT:  Head: Normocephalic and atraumatic.  Eyes: Conjunctivae and EOM are normal.  Pulmonary/Chest: Effort normal. No stridor. No respiratory distress.  Neurological: She is alert and oriented to person, place, and time.  Psychiatric: She has a normal mood and affect. Her behavior is normal. Judgment and thought content normal.    Assessment and Plan [place] Long was seen today for migraines/mood.  Diagnoses and all orders for this visit:  Chronic migraine- continue pamelor and imitrex regimen  Amenorrhea- discussed that since she has tubal she might be stressed so will wait for another month -     POCT urine  pregnancy  Generalized anxiety disorder- continue with stress mgmt, follow up with Psychiatry and therapy  Primary insomnia -  Advised melatonin  Other orders -     SUMAtriptan (IMITREX) 25 MG tablet; Take 1 tablet (25 mg total) by mouth once for 1 dose. May repeat in 2 hours if headache persists or recurs. -     nortriptyline (PAMELOR) 50 MG capsule; Take 1 capsule (50  mg total) by mouth daily.     Ritchie Klee A Delania Ferg

## 2018-01-25 NOTE — Patient Instructions (Addendum)
Try melatonin as a sleep aide    IF you received an x-ray today, you will receive an invoice from [facility]. Please contact [facility] at [phone] with questions or concerns regarding your invoice.   IF you received labwork today, you will receive an invoice from [facility]. Please contact [facility] at [phone] with questions or concerns regarding your invoice.   Our billing staff will not be able to assist you with questions regarding bills from these companies.  You will be contacted with the lab results as soon as they are available. The fastest way to get your results is to activate your My Chart account. Instructions are located on the last page of this paperwork. If you have not heard from us regarding the results in 2 weeks, please contact this office.    Insomnia Insomnia is a sleep disorder that makes it difficult to fall asleep or to stay asleep. Insomnia can cause tiredness (fatigue), low energy, difficulty concentrating, mood swings, and poor performance at work or school. There are three different ways to classify insomnia:  Difficulty falling asleep.  Difficulty staying asleep.  Waking up too early in the morning.  Any type of insomnia can be long-term (chronic) or short-term (acute). Both are common. Short-term insomnia usually lasts for three months or less. Chronic insomnia occurs at least three times a week for longer than three months. What are the causes? Insomnia may be caused by another condition, situation, or substance, such as:  Anxiety.  Certain medicines.  Gastroesophageal reflux disease (GERD) or other gastrointestinal conditions.  Asthma or other breathing conditions.  Restless legs syndrome, sleep apnea, or other sleep disorders.  Chronic pain.  Menopause. This may include hot flashes.  Stroke.  Abuse of alcohol, tobacco, or illegal drugs.  Depression.  Caffeine.  Neurological disorders, such as Alzheimer  disease.  An overactive thyroid (hyperthyroidism).  The cause of insomnia may not be known. What increases the risk? Risk factors for insomnia include:  Gender. Women are more commonly affected than men.  Age. Insomnia is more common as you get older.  Stress. This may involve your professional or personal life.  Income. Insomnia is more common in people with lower income.  Lack of exercise.  Irregular work schedule or night shifts.  Traveling between different time zones.  What are the signs or symptoms? If you have insomnia, trouble falling asleep or trouble staying asleep is the main symptom. This may lead to other symptoms, such as:  Feeling fatigued.  Feeling nervous about going to sleep.  Not feeling rested in the morning.  Having trouble concentrating.  Feeling irritable, anxious, or depressed.  How is this treated? Treatment for insomnia depends on the cause. If your insomnia is caused by an underlying condition, treatment will focus on addressing the condition. Treatment may also include:  Medicines to help you sleep.  Counseling or therapy.  Lifestyle adjustments.  Follow these instructions at home:  Take medicines only as directed by your health care provider.  Keep regular sleeping and waking hours. Avoid naps.  Keep a sleep diary to help you and your health care provider figure out what could be causing your insomnia. Include: ? When you sleep. ? When you wake up during the night. ? How well you sleep. ? How rested you feel the next day. ? Any side effects of medicines you are taking. ? What you eat and drink.  Make your bedroom a comfortable place where it is easy to fall asleep: ?  Put up shades or special blackout curtains to block light from outside. ? Use a white noise machine to block noise. ? Keep the temperature cool.  Exercise regularly as directed by your health care provider. Avoid exercising right before bedtime.  Use relaxation  techniques to manage stress. Ask your health care provider to suggest some techniques that may work well for you. These may include: ? Breathing exercises. ? Routines to release muscle tension. ? Visualizing peaceful scenes.  Cut back on alcohol, caffeinated beverages, and cigarettes, especially close to bedtime. These can disrupt your sleep.  Do not overeat or eat spicy foods right before bedtime. This can lead to digestive discomfort that can make it hard for you to sleep.  Limit screen use before bedtime. This includes: ? Watching TV. ? Using your smartphone, tablet, and computer.  Stick to a routine. This can help you fall asleep faster. Try to do a quiet activity, brush your teeth, and go to bed at the same time each night.  Get out of bed if you are still awake after 15 minutes of trying to sleep. Keep the lights down, but try reading or doing a quiet activity. When you feel sleepy, go back to bed.  Make sure that you drive carefully. Avoid driving if you feel very sleepy.  Keep all follow-up appointments as directed by your health care provider. This is important. Contact a health care provider if:  You are tired throughout the day or have trouble in your daily routine due to sleepiness.  You continue to have sleep problems or your sleep problems get worse. Get help right away if:  You have serious thoughts about hurting yourself or someone else. This information is not intended to replace advice given to you by your health care provider. Make sure you discuss any questions you have with your health care provider. Document Released: 09/16/2000 Document Revised: 02/19/2016 Document Reviewed: 06/20/2014 Elsevier Interactive Patient Education  [employer].

## 2018-02-14 ENCOUNTER — Encounter: Payer: Self-pay | Admitting: Physician Assistant

## 2018-02-14 ENCOUNTER — Other Ambulatory Visit: Payer: Self-pay

## 2018-02-14 ENCOUNTER — Ambulatory Visit: Payer: BLUE CROSS/BLUE SHIELD | Admitting: Physician Assistant

## 2018-02-14 VITALS — BP 112/84 | HR 99 | Temp 98.3°F | Resp 18 | Ht 60.91 in | Wt 144.2 lb

## 2018-02-14 DIAGNOSIS — J069 Acute upper respiratory infection, unspecified: Secondary | ICD-10-CM | POA: Diagnosis not present

## 2018-02-14 DIAGNOSIS — J029 Acute pharyngitis, unspecified: Secondary | ICD-10-CM | POA: Diagnosis not present

## 2018-02-14 LAB — POCT RAPID STREP A (OFFICE): RAPID STREP A SCREEN: NEGATIVE

## 2018-02-14 MED ORDER — BENZONATATE 100 MG PO CAPS: 100.0000 mg | ORAL_CAPSULE | Freq: Three times a day (TID) | ORAL | 0 refills | Status: DC | PRN

## 2018-02-14 MED ORDER — BENZOCAINE-MENTHOL 15-3.6 MG MT LOZG: 1.0000 | LOZENGE | OROMUCOSAL | 0 refills | Status: DC | PRN

## 2018-02-14 MED ORDER — OXYMETAZOLINE HCL 0.05 % NA SOLN: 1.0000 | Freq: Two times a day (BID) | NASAL | 0 refills | Status: DC

## 2018-02-14 NOTE — Patient Instructions (Addendum)
- We will treat this as a respiratory viral infection.  - I recommend you rest, drink plenty of fluids, eat light meals including soups.  - You may use nyqyuil cough syrup at night for your cough and sore throat, Tessalon pearls during the day.  -afrin for 3 days along with flonase for nasal congestion. - You may also use Tylenol or ibuprofen over-the-counter for your sore throat along with throat lozenges.Tea recipe for sore throat: boil water, add 2 inches shaved ginger root, steep 15 minutes, add juice from 2 full lemons, and 2 tbsp honey. - Please let me know if you are not seeing any improvement or get worse in 5-7 days.   Upper Respiratory Infection, Adult Most upper respiratory infections (URIs) are caused by a virus. A URI affects the nose, throat, and upper air passages. The most common type of URI is often called "the common cold." Follow these instructions at home:  Take medicines only as told by your doctor.  Gargle warm saltwater or take cough drops to comfort your throat as told by your doctor.  Use a warm mist humidifier or inhale steam from a shower to increase air moisture. This may make it easier to breathe.  Drink enough fluid to keep your pee (urine) clear or pale yellow.  Eat soups and other clear broths.  Have a healthy diet.  Rest as needed.  Go back to work when your fever is gone or your doctor says it is okay. ? You may need to stay home longer to avoid giving your URI to others. ? You can also wear a face mask and wash your hands often to prevent spread of the virus.  Use your inhaler more if you have asthma.  Do not use any tobacco products, including cigarettes, chewing tobacco, or electronic cigarettes. If you need help quitting, ask your doctor. Contact a doctor if:  You are getting worse, not better.  Your symptoms are not helped by medicine.  You have chills.  You are getting more short of breath.  You have brown or red mucus.  You have  yellow or brown discharge from your nose.  You have pain in your face, especially when you bend forward.  You have a fever.  You have puffy (swollen) neck glands.  You have pain while swallowing.  You have white areas in the back of your throat. Get help right away if:  You have very bad or constant: ? Headache. ? Ear pain. ? Pain in your forehead, behind your eyes, and over your cheekbones (sinus pain). ? Chest pain.  You have long-lasting (chronic) lung disease and any of the following: ? Wheezing. ? Long-lasting cough. ? Coughing up blood. ? A change in your usual mucus.  You have a stiff neck.  You have changes in your: ? Vision. ? Hearing. ? Thinking. ? Mood. This information is not intended to replace advice given to you by your health care provider. Make sure you discuss any questions you have with your health care provider. Document Released: 03/07/2008 Document Revised: 05/22/2016 Document Reviewed: 12/25/2013 Elsevier Interactive Patient Education  2018 [employer].       IF you received an x-ray today, you will receive an invoice from [facility]. Please contact [facility] at [phone] with questions or concerns regarding your invoice.   IF you received labwork today, you will receive an invoice from [facility]. Please contact [facility] at [phone] with questions or concerns regarding your invoice.   Our  billing staff will not be able to assist you with questions regarding bills from these companies.  You will be contacted with the lab results as soon as they are available. The fastest way to get your results is to activate your My Chart account. Instructions are located on the last page of this paperwork. If you have not heard from [place] regarding the results in 2 weeks, please contact this office.

## 2018-02-14 NOTE — Progress Notes (Signed)
MRN: [id number] DOB: [birth year]  Subjective:   [name] [name] is a 32 y.o. female presenting for chief complaint of Sore Throat (X 3 dayshard to swallow ) and Generalized Body Aches (pt states that she had body aches and pains on Monday and Tuesday. Pt states this moringing she was ok) .  Reports 3 day history of sore throat. Has associated nasal congestion, generalized body aches, and dry cough.  Voice is starting to get hoarse and she talks on the phone consistently at work. Has tried flonase with no full relief. Denies fever, inability to swallow, sinus pain, ear pain, wheezing, shortness of breath and chest pain, nausea, vomiting, abdominal pain and diarrhea. Has not had sick contact with anyone. Has history of seasonal allergies, no history of asthma. Denies smoking. Denies any other aggravating or relieving factors, no other questions or concerns.  [name] [name] has a current medication list which includes the following prescription(s): fluticasone, nortriptyline, and sumatriptan. Also is allergic to pollen extract.  [name] [name]  has a past medical history of Allergy, Depression, Gonorrhea, Headache(784.0), Infection, Shoulder dystocia, delivered, current hospitalization (06/14/2014), and Vaginal Pap smear, abnormal. Also  has a past surgical history that includes No past surgeries and Cesarean section with bilateral tubal ligation (Bilateral, 07/20/2016).   Objective:   Vitals: BP 112/84 (BP Location: Left Arm, Patient Position: Sitting, Cuff Size: Normal)   Pulse 99   Temp 98.3 F (36.8 C) (Oral)   Resp 18   Ht 5' 0.91" (1.547 m)   Wt 144 lb 3.2 oz (65.4 kg)   LMP 01/31/2018 (Exact Date)   SpO2 97%   Breastfeeding? No   BMI 27.33 kg/m   Physical Exam  Constitutional: She is oriented to person, place, and time. She appears well-developed and well-nourished. No distress.  HENT:  Head: Normocephalic and atraumatic.  Right Ear: Tympanic membrane, external ear and ear canal normal.    Left Ear: Tympanic membrane, external ear and ear canal normal.  Nose: Mucosal edema (moderate bilaterally) present. Right sinus exhibits no maxillary sinus tenderness and no frontal sinus tenderness. Left sinus exhibits no maxillary sinus tenderness and no frontal sinus tenderness.  Mouth/Throat: Uvula is midline and mucous membranes are normal. Posterior oropharyngeal erythema present. No posterior oropharyngeal edema or tonsillar abscesses. Tonsils are 2+ on the right. Tonsils are 2+ on the left. No tonsillar exudate.  Tonsils are erythematous bilaterally.   Eyes: Conjunctivae are normal.  Neck: Normal range of motion.  Cardiovascular: Normal rate, regular rhythm, normal heart sounds and intact distal pulses.  Pulmonary/Chest: Effort normal and breath sounds normal. She has no decreased breath sounds. She has no wheezes. She has no rhonchi. She has no rales.  Lymphadenopathy:       Head (right side): No submental, no submandibular, no tonsillar, no preauricular, no posterior auricular and no occipital adenopathy present.       Head (left side): No submental, no submandibular, no tonsillar, no preauricular, no posterior auricular and no occipital adenopathy present.    She has no cervical adenopathy.       Right: No supraclavicular adenopathy present.       Left: No supraclavicular adenopathy present.  Neurological: She is alert and oriented to person, place, and time.  Skin: Skin is warm and dry.  Psychiatric: She has a normal mood and affect.  Vitals reviewed.   Results for orders placed or performed in visit on 02/14/18 (from the past 24 hour(s))  POCT rapid strep A  Status: Normal   Collection Time: 02/14/18 12:19 PM  Result Value Ref Range   Rapid Strep A Screen Negative Negative    Assessment and Plan :  1. Acute upper respiratory infection - Likely viral in etiology d/t reassuring physical exam findings and labs. - Advised supportive care, offered symptomatic relief. -  Return to clinic if symptoms worsen or fail to improve in 5-7 days, otherwise return to clinic as needed. - benzonatate (TESSALON) 100 MG capsule; Take 1-2 capsules (100-200 mg total) by mouth 3 (three) times daily as needed for cough.  Dispense: 40 capsule; Refill: 0 - Benzocaine-Menthol (CEPACOL SORE THROAT) 15-3.6 MG LOZG; Use as directed 1 lozenge in the mouth or throat every 2 (two) hours as needed.  Dispense: 16 each; Refill: 0 - oxymetazoline (AFRIN NASAL SPRAY) 0.05 % nasal spray; Place 1 spray into both nostrils 2 (two) times daily.  Dispense: 30 mL; Refill: 0  2. Sore throat - POCT rapid strep A - Culture, Group A Strep   Benjiman Core, PA-C  Primary Care at [facility] 02/14/2018 12:33 PM

## 2018-02-17 LAB — CULTURE, GROUP A STREP

## 2018-02-19 ENCOUNTER — Other Ambulatory Visit: Payer: Self-pay | Admitting: Physician Assistant

## 2018-02-19 MED ORDER — AMOXICILLIN 500 MG PO CAPS: 500.0000 mg | ORAL_CAPSULE | Freq: Two times a day (BID) | ORAL | 0 refills | Status: DC

## 2018-02-19 NOTE — Progress Notes (Signed)
Meds ordered this encounter  Medications  . amoxicillin (AMOXIL) 500 MG capsule    Sig: Take 1 capsule (500 mg total) by mouth 2 (two) times daily.    Dispense:  20 capsule    Refill:  0    Order Specific Question:   Supervising Provider    Answer:   SMITH, KRISTI M [[id number]]    

## 2018-02-21 ENCOUNTER — Ambulatory Visit: Payer: BLUE CROSS/BLUE SHIELD | Admitting: Family Medicine

## 2018-02-21 ENCOUNTER — Encounter: Payer: Self-pay | Admitting: Family Medicine

## 2018-02-21 ENCOUNTER — Other Ambulatory Visit: Payer: Self-pay

## 2018-02-21 VITALS — BP 108/82 | HR 84 | Temp 99.4°F | Resp 16 | Ht 60.91 in | Wt 145.0 lb

## 2018-02-21 DIAGNOSIS — F5101 Primary insomnia: Secondary | ICD-10-CM | POA: Diagnosis not present

## 2018-02-21 DIAGNOSIS — G43709 Chronic migraine without aura, not intractable, without status migrainosus: Secondary | ICD-10-CM | POA: Diagnosis not present

## 2018-02-21 DIAGNOSIS — J020 Streptococcal pharyngitis: Secondary | ICD-10-CM | POA: Diagnosis not present

## 2018-02-21 DIAGNOSIS — IMO0002 Reserved for concepts with insufficient information to code with codable children: Secondary | ICD-10-CM

## 2018-02-21 LAB — POCT RAPID STREP A (OFFICE): Rapid Strep A Screen: NEGATIVE

## 2018-02-21 MED ORDER — NORTRIPTYLINE HCL 50 MG PO CAPS: 100.0000 mg | ORAL_CAPSULE | Freq: Every day | ORAL | 0 refills | Status: DC

## 2018-02-21 NOTE — Progress Notes (Signed)
Chief Complaint  Patient presents with  . fmla paperwork    HPI  She reports that her medications have not been helping her like they were a month ago She reports that she is taking 3-4 imitrex a day She has 7 days of migraines a month She states that the headaches are worsening with worsening photophobia She also has positive strep  She denies fevers or chills   Past Medical History:  Diagnosis Date  . Allergy   . Depression   . Gonorrhea   . Headache(784.0)   . Infection    UTI  . Shoulder dystocia, delivered, current hospitalization 06/14/2014  . Vaginal Pap smear, abnormal    f/u ok    Current Outpatient Medications  Medication Sig Dispense Refill  . Benzocaine-Menthol (CEPACOL SORE THROAT) 15-3.6 MG LOZG Use as directed 1 lozenge in the mouth or throat every 2 (two) hours as needed. 16 each 0  . benzonatate (TESSALON) 100 MG capsule Take 1-2 capsules (100-200 mg total) by mouth 3 (three) times daily as needed for cough. 40 capsule 0  . fluticasone (FLONASE) 50 MCG/ACT nasal spray Place 2 sprays into both nostrils daily. 16 g 0  . nortriptyline (PAMELOR) 50 MG capsule Take 1 capsule (50 mg total) by mouth daily. 30 capsule 3  . oxymetazoline (AFRIN NASAL SPRAY) 0.05 % nasal spray Place 1 spray into both nostrils 2 (two) times daily. 30 mL 0  . amoxicillin (AMOXIL) 500 MG capsule Take 1 capsule (500 mg total) by mouth 2 (two) times daily. (Patient not taking: Reported on 02/21/2018) 20 capsule 0  . SUMAtriptan (IMITREX) 25 MG tablet Take 1 tablet (25 mg total) by mouth once for 1 dose. May repeat in 2 hours if headache persists or recurs. 10 tablet 6   No current facility-administered medications for this visit.     Allergies:  Allergies  Allergen Reactions  . Pollen Extract Itching    Past Surgical History:  Procedure Laterality Date  . CESAREAN SECTION WITH BILATERAL TUBAL LIGATION Bilateral 07/20/2016   Procedure: REPEAT CESAREAN SECTION WITH BILATERAL TUBAL  LIGATION;  Surgeon: Jaymes Graff, MD;  Location: [facility];  Service: Obstetrics;  Laterality: Bilateral;  Provider requests RNFA -ap  . NO PAST SURGERIES      Social History   Socioeconomic History  . Marital status: Married    Spouse name: Not on file  . Number of children: 3  . Years of education: Not on file  . Highest education level: Not on file  Occupational History  . Not on file  Social Needs  . Financial resource strain: Not on file  . Food insecurity:    Worry: Not on file    Inability: Not on file  . Transportation needs:    Medical: Not on file    Non-medical: Not on file  Tobacco Use  . Smoking status: Former Games developer  . Smokeless tobacco: Former Neurosurgeon  . Tobacco comment: not every day smoker, prior to the preg  Substance and Sexual Activity  . Alcohol use: Yes    Comment: occ  . Drug use: No  . Sexual activity: Yes    Birth control/protection: None  Lifestyle  . Physical activity:    Days per week: Not on file    Minutes per session: Not on file  . Stress: Not on file  Relationships  . Social connections:    Talks on phone: Not on file    Gets together: Not on file  Attends religious service: Not on file    Active member of club or organization: Not on file    Attends meetings of clubs or organizations: Not on file    Relationship status: Not on file  Other Topics Concern  . Not on file  Social History Narrative  . Not on file    Family History  Problem Relation Age of Onset  . Hearing loss Neg Hx      ROS Review of Systems See HPI Constitution: No fevers or chills No malaise No diaphoresis Skin: No rash or itching Eyes: no blurry vision, no double vision GU: no dysuria or hematuria Neuro: no dizziness or headaches all others reviewed and negative   Objective: Vitals:   02/21/18 1448  BP: 108/82  Pulse: 84  Resp: 16  Temp: 99.4 F (37.4 C)  TempSrc: Oral  SpO2: 99%  Weight: 145 lb (65.8 kg)  Height: 5' 0.91"  (1.547 m)    Physical Exam  Constitutional: She is oriented to person, place, and time. She appears well-developed and well-nourished.  HENT:  Head: Normocephalic and atraumatic.  Eyes: Conjunctivae and EOM are normal.  Cardiovascular: Normal rate, regular rhythm and normal heart sounds.  No murmur heard. Pulmonary/Chest: Effort normal and breath sounds normal. No stridor. No respiratory distress. She has no wheezes.  Neurological: She is alert and oriented to person, place, and time.  Skin: Skin is warm. Capillary refill takes less than 2 seconds.  Psychiatric: She has a normal mood and affect. Her behavior is normal. Judgment and thought content normal.    Assessment and Plan Jesly Hartmann was seen today for fmla paperwork.  Diagnoses and all orders for this visit:  Chronic migraine Primary insomnia  She is still not sleeping well which I believe is a trigger for her migraines  She is not currently controlled with the nortriptyline and imitrex which was working for her previously Will increase her nortriptyline dose to 2 capsules at bedtime for   Streptococcal sore throat Pt symptoms improved Will recommend taking abx prescribed since she was culture positive  Sheilah Rayos A Creta Levin

## 2018-02-21 NOTE — Patient Instructions (Addendum)
   IF you received an x-ray today, you will receive an invoice from [facility]. Please contact [facility] at [phone] with questions or concerns regarding your invoice.   IF you received labwork today, you will receive an invoice from [facility]. Please contact [facility] at [phone] with questions or concerns regarding your invoice.   Our billing staff will not be able to assist you with questions regarding bills from these companies.  You will be contacted with the lab results as soon as they are available. The fastest way to get your results is to activate your My Chart account. Instructions are located on the last page of this paperwork. If you have not heard from us regarding the results in 2 weeks, please contact this office.      Migraine Headache A migraine headache is an intense, throbbing pain on one side or both sides of the head. Migraines may also cause other symptoms, such as nausea, vomiting, and sensitivity to light and noise. What are the causes? Doing or taking certain things may also trigger migraines, such as:  Alcohol.  Smoking.  Medicines, such as: ? Medicine used to treat chest pain (nitroglycerine). ? Birth control pills. ? Estrogen pills. ? Certain blood pressure medicines.  Aged cheeses, chocolate, or caffeine.  Foods or drinks that contain nitrates, glutamate, aspartame, or tyramine.  Physical activity.  Other things that may trigger a migraine include:  Menstruation.  Pregnancy.  Hunger.  Stress, lack of sleep, too much sleep, or fatigue.  Weather changes.  What increases the risk? The following factors may make you more likely to experience migraine headaches:  Age. Risk increases with age.  Family history of migraine headaches.  Being Caucasian.  Depression and anxiety.  Obesity.  Being a woman.  Having a hole in the heart (patent foramen ovale) or other heart problems.  What are the signs or  symptoms? The main symptom of this condition is pulsating or throbbing pain. Pain may:  Happen in any area of the head, such as on one side or both sides.  Interfere with daily activities.  Get worse with physical activity.  Get worse with exposure to bright lights or loud noises.  Other symptoms may include:  Nausea.  Vomiting.  Dizziness.  General sensitivity to bright lights, loud noises, or smells.  Before you get a migraine, you may get warning signs that a migraine is developing (aura). An aura may include:  Seeing flashing lights or having blind spots.  Seeing bright spots, halos, or zigzag lines.  Having tunnel vision or blurred vision.  Having numbness or a tingling feeling.  Having trouble talking.  Having muscle weakness.  How is this diagnosed? A migraine headache can be diagnosed based on:  Your symptoms.  A physical exam.  Tests, such as CT scan or MRI of the head. These imaging tests can help rule out other causes of headaches.  Taking fluid from the spine (lumbar puncture) and analyzing it (cerebrospinal fluid analysis, or CSF analysis).  How is this treated? A migraine headache is usually treated with medicines that:  Relieve pain.  Relieve nausea.  Prevent migraines from coming back.  Treatment may also include:  Acupuncture.  Lifestyle changes like avoiding foods that trigger migraines.  Follow these instructions at home: Medicines  Take over-the-counter and prescription medicines only as told by your health care provider.  Do not drive or use heavy machinery while taking prescription pain medicine.  To prevent or treat constipation while you   are taking prescription pain medicine, your health care provider may recommend that you: ? Drink enough fluid to keep your urine clear or pale yellow. ? Take over-the-counter or prescription medicines. ? Eat foods that are high in fiber, such as fresh fruits and vegetables, whole grains,  and beans. ? Limit foods that are high in fat and processed sugars, such as fried and sweet foods. Lifestyle  Avoid alcohol use.  Do not use any products that contain nicotine or tobacco, such as cigarettes and e-cigarettes. If you need help quitting, ask your health care provider.  Get at least 8 hours of sleep every night.  Limit your stress. General instructions   Keep a journal to find out what may trigger your migraine headaches. For example, write down: ? What you eat and drink. ? How much sleep you get. ? Any change to your diet or medicines.  If you have a migraine: ? Avoid things that make your symptoms worse, such as bright lights. ? It may help to lie down in a dark, quiet room. ? Do not drive or use heavy machinery. ? Ask your health care provider what activities are safe for you while you are experiencing symptoms.  Keep all follow-up visits as told by your health care provider. This is important. Contact a health care provider if:  You develop symptoms that are different or more severe than your usual migraine symptoms. Get help right away if:  Your migraine becomes severe.  You have a fever.  You have a stiff neck.  You have vision loss.  Your muscles feel weak or like you cannot control them.  You start to lose your balance often.  You develop trouble walking.  You faint. This information is not intended to replace advice given to you by your health care provider. Make sure you discuss any questions you have with your health care provider. Document Released: 09/19/2005 Document Revised: 04/08/2016 Document Reviewed: 03/07/2016 Elsevier Interactive Patient Education  2017 [employer].  

## 2018-02-22 DIAGNOSIS — Z0271 Encounter for disability determination: Secondary | ICD-10-CM

## 2018-02-23 ENCOUNTER — Telehealth: Payer: Self-pay | Admitting: Family Medicine

## 2018-02-23 NOTE — Telephone Encounter (Signed)
Copied from CRM [id number]. Topic: Quick Communication - See Telephone Encounter >> Feb 23, 2018  4:35 PM Floria Raveling A wrote: CRM for notification. See Telephone encounter for: 02/23/18.pt called called and would like to know if her FMLA paperwork was faxed to employer? Today was deadline per pt   Best number -[phone]

## 2018-02-27 NOTE — Telephone Encounter (Signed)
Paperwork was faxed to employer on 5/23

## 2018-03-11 ENCOUNTER — Encounter (HOSPITAL_COMMUNITY): Payer: Self-pay | Admitting: Emergency Medicine

## 2018-03-11 ENCOUNTER — Emergency Department (HOSPITAL_COMMUNITY)
Admission: EM | Admit: 2018-03-11 | Discharge: 2018-03-11 | Disposition: A | Discharge status: Home / Self Care | Payer: BLUE CROSS/BLUE SHIELD | Attending: Emergency Medicine | Admitting: Emergency Medicine

## 2018-03-11 DIAGNOSIS — M79645 Pain in left finger(s): Secondary | ICD-10-CM | POA: Diagnosis present

## 2018-03-11 DIAGNOSIS — Z87891 Personal history of nicotine dependence: Secondary | ICD-10-CM | POA: Insufficient documentation

## 2018-03-11 DIAGNOSIS — Z79899 Other long term (current) drug therapy: Secondary | ICD-10-CM | POA: Diagnosis not present

## 2018-03-11 DIAGNOSIS — W4904XA Ring or other jewelry causing external constriction, initial encounter: Secondary | ICD-10-CM | POA: Insufficient documentation

## 2018-03-11 NOTE — Discharge Instructions (Signed)
Your ring was removed, pain and swelling should improve now, please use ice and elevation, ibuprofen and Tylenol for pain.  You can take your ring to a jewelry store and they should be able to repair it.  Please avoid putting the ring back on the finger until swelling and pain has completely resolved.  Return for worsening pain, redness, swelling, numbness any other new or concerning symptoms.

## 2018-03-11 NOTE — ED Triage Notes (Signed)
Patient reports she noticed she could not remove ring from left fourth finger on Wednesday. Reports trying "everything" at home to get it off. States swelling increased today. Movement and sensation to finger.

## 2018-03-11 NOTE — ED Provider Notes (Signed)
[facility]-EMERGENCY DEPT Provider Note   CSN: [id number] Arrival date & time: 03/11/18  1400     History   Chief Complaint Chief Complaint  Patient presents with  . Hand Pain    HPI Judeen HammansYan Long Delisle is a 32 y.o. female.  Judeen HammansYan Long Strauss is a 32 y.o. Female with history of allergies, headaches, and depression, who presents to the ED for evaluation of the ring stuck on her left ring finger.  Patient reports approximately 5 days ago she tried to take her wedding band off her left finger and realized it was stuck, she has had this ring in place for many years and has not really tried to remove it recently.  Patient reports for the past 5 days she is repeatedly been trying to remove the ring and has been unsuccessful.  She reports she is tried everything, including lubricating with lotion and all of oil, as well as several hacks from YouTube such as using ribbon or string and wrapping the finger from under the ring without success.  Patient reports is become increasingly painful swollen and red directly surrounding the ring she denies any injury to the finger, just reports is really starting to hurt from her pulling around and she just wants the ring off at this point.  She reports mild decrease in sensation but denies any numbness.  No cuts or bleeding, tetanus is up-to-date.     Past Medical History:  Diagnosis Date  . Allergy   . Depression   . Gonorrhea   . Headache(784.0)   . Infection    UTI  . Shoulder dystocia, delivered, current hospitalization 06/14/2014  . Vaginal Pap smear, abnormal    f/u ok    Patient Active Problem List   Diagnosis Date Noted  . Hx of tubal ligation 10/11/2017  . 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 S/P primary low transverse C-section 07/20/2016  . S/P cesarean section 07/20/2016  . History of miscarriage 07/20/2013    Past Surgical History:  Procedure Laterality Date  . CESAREAN  SECTION WITH BILATERAL TUBAL LIGATION Bilateral 07/20/2016   Procedure: REPEAT CESAREAN SECTION WITH BILATERAL TUBAL LIGATION;  Surgeon: Jaymes GraffNaima Dillard, MD;  Location: [facility];  Service: Obstetrics;  Laterality: Bilateral;  Provider requests RNFA -ap  . NO PAST SURGERIES       OB History    Gravida  4   Para  2   Term  2   Preterm      AB  2   Living  2     SAB  2   TAB      Ectopic      Multiple  0   Live Births  2            Home Medications    Prior to Admission medications   Medication Sig Start Date End Date Taking? Authorizing Provider  amoxicillin (AMOXIL) 500 MG capsule Take 1 capsule (500 mg total) by mouth 2 (two) times daily. Patient not taking: Reported on 02/21/2018 02/19/18   Benjiman CoreWiseman, Brittany D, PA-C  Benzocaine-Menthol (CEPACOL SORE THROAT) 15-3.6 MG LOZG Use as directed 1 lozenge in the mouth or throat every 2 (two) hours as needed. 02/14/18   Benjiman CoreWiseman, Brittany D, PA-C  benzonatate (TESSALON) 100 MG capsule Take 1-2 capsules (100-200 mg total) by mouth 3 (three) times daily as needed for cough. 02/14/18   Benjiman CoreWiseman, Brittany D, PA-C  fluticasone (FLONASE) 50 MCG/ACT  nasal spray Place 2 sprays into both nostrils daily. 09/01/17   Domenick Gong, MD  nortriptyline (PAMELOR) 50 MG capsule Take 2 capsules (100 mg total) by mouth at bedtime. 02/21/18   Doristine Bosworth, MD  oxymetazoline (AFRIN NASAL SPRAY) 0.05 % nasal spray Place 1 spray into both nostrils 2 (two) times daily. 02/14/18   Benjiman Core D, PA-C  SUMAtriptan (IMITREX) 25 MG tablet Take 1 tablet (25 mg total) by mouth once for 1 dose. May repeat in 2 hours if headache persists or recurs. 01/25/18 01/25/18  Doristine Bosworth, MD    Family History Family History  Problem Relation Age of Onset  . Hearing loss Neg Hx     Social History Social History   Tobacco Use  . Smoking status: Former Games developer  . Smokeless tobacco: Former Neurosurgeon  . Tobacco comment: not every day smoker, prior  to the preg  Substance Use Topics  . Alcohol use: Yes    Comment: occ  . Drug use: No     Allergies   Pollen extract   Review of Systems Review of Systems  Constitutional: Negative for chills and fever.  Musculoskeletal: Positive for arthralgias and joint swelling.  Skin: Positive for color change.  Neurological: Negative for weakness and numbness.     Physical Exam Updated Vital Signs BP (!) 129/91 (BP Location: Right Arm)   Pulse 84   Temp 98 F (36.7 C) (Oral)   Resp 18   LMP 03/03/2018   SpO2 100%   Physical Exam  Constitutional: She appears well-developed and well-nourished. No distress.  HENT:  Head: Normocephalic and atraumatic.  Eyes: Right eye exhibits no discharge. Left eye exhibits no discharge.  Pulmonary/Chest: Effort normal. No respiratory distress.  Musculoskeletal:  Small ring band present at the base of the left fourth finger, with redness and swelling just surrounding the ring, redness swelling is not present over the distal finger, patient is able to bend and extend the finger without difficulty, sensation is slightly decreased when compared to adjacent fingers, good capillary refill.  Neurological: She is alert. Coordination normal.  Skin: Skin is warm and dry. Capillary refill takes less than 2 seconds. She is not diaphoretic.  Psychiatric: She has a normal mood and affect. Her behavior is normal.  Nursing note and vitals reviewed.    ED Treatments / Results  Labs (all labs ordered are listed, but only abnormal results are displayed) Labs Reviewed - No data to display  EKG None  Radiology No results found.  Procedures Procedures (including critical care time)  Medications Ordered in ED Medications - No data to display   Initial Impression / Assessment and Plan / ED Course  I have reviewed the triage vital signs and the nursing notes.  Pertinent labs & imaging results that were available during my care of the patient were reviewed  by me and considered in my medical decision making (see chart for details).  Patient presents to the ED for ring removal, reports she is been trying to remove wedding band from left ring finger for the past 5 days, finger has become progressively painful swollen and red just surrounding the ring.  No preceding injury to the finger.  She has tried multiple modalities at home, at this point we will need to use a ring cutter to remove the ring.  Patient has slightly decreased sensation, but normal range of motion and good perfusion with brisk capillary refill.   Ring successfully removed with the help of  Ortho technician using ring cutter and 2 needle holders to pull ring apart, afterwards patient had immediate improvement in pain, she tolerated the procedure well.  Encouraged ice, elevation, Tylenol and Motrin as needed for pain.  Swelling and redness should improve.  She is to follow-up with her primary care doctor.  Return precautions discussed.  Patient expresses understanding and is in agreement with plan, stable for discharge at this time.  Final Clinical Impressions(s) / ED Diagnoses   Final diagnoses:  Ring or other jewelry causing external constriction, initial encounter    ED Discharge Orders    None       Dartha Lodge, [place] 03/11/18 1454    Wynetta Fines, MD 03/11/18 1535

## 2018-03-16 ENCOUNTER — Ambulatory Visit (INDEPENDENT_AMBULATORY_CARE_PROVIDER_SITE_OTHER): Payer: BLUE CROSS/BLUE SHIELD | Admitting: Neurology

## 2018-03-16 ENCOUNTER — Other Ambulatory Visit: Payer: Self-pay

## 2018-03-16 ENCOUNTER — Encounter: Payer: Self-pay | Admitting: Neurology

## 2018-03-16 ENCOUNTER — Encounter

## 2018-03-16 DIAGNOSIS — G43019 Migraine without aura, intractable, without status migrainosus: Secondary | ICD-10-CM

## 2018-03-16 HISTORY — DX: Migraine without aura, intractable, without status migrainosus: G43.019

## 2018-03-16 MED ORDER — SUMATRIPTAN SUCCINATE 100 MG PO TABS: 100.0000 mg | ORAL_TABLET | Freq: Two times a day (BID) | ORAL | 3 refills | Status: DC | PRN

## 2018-03-16 MED ORDER — TOPIRAMATE 25 MG PO TABS: ORAL_TABLET | ORAL | 3 refills | Status: DC

## 2018-03-16 NOTE — Progress Notes (Signed)
Reason for visit: Migraine headache  Referring physician: Dr. Poynor Callas [name] [name] is a 32 y.o. female  History of present illness:  [name] [name] is a 32 year old right-handed female with a history of intractable migraine headache.  The patient also reports chronic issues with insomnia.  She has had headaches off and on since 2008, but her headaches have become more frequent recently.  The patient over the last 9 months has had at least 2-3 headaches a week, she may have anywhere from 10-15 headache days a month.  She has found that even low-dose Imitrex taking 25 mg will be quite effective for controlling the headache, she also takes Excedrin migraine when she does not have the Imitrex, this offers modest benefit.  Currently, she is taking up to 6 Excedrin Migraine daily when she gets the headache, she may take over 18 to 20 tablets a week.  She also drinks a cup of coffee each morning, and she may have 4 or 5 soft drinks a week.  The patient has been missing significant amounts of work, she has been using up her FMLA, she is out of days to take off.  She was placed on nortriptyline in January of 2019, she was worked up to a 100 mg nightly dose which does not help her insomnia and has not helped her headache.  The patient has never been on any other medication to help prevent her headaches previously.  The patient reports the headaches are left frontal in nature, they are associated with photophobia and some phonophobia, she does have nausea usually without vomiting.  The patient reports no visual changes, numbness or weakness, speech alteration, but she does have some trouble with concentration during the headache.  The patient usually has a headache throughout the day, sometimes she may wake up at night with a headache.  She is sent to this office for further evaluation.  She denies any family history of migraine.  She denies neck stiffness with the headache.  The patient has no definite  activators for the headache, sometimes she may get a headache if she does not eat properly, otherwise the headaches appear to be random in nature.  Past Medical History:  Diagnosis Date  . Allergy   . Common migraine with intractable migraine 03/16/2018  . Depression   . Gonorrhea   . Headache(784.0)   . Infection    UTI  . Shoulder dystocia, delivered, current hospitalization 06/14/2014  . Vaginal Pap smear, abnormal    f/u ok    Past Surgical History:  Procedure Laterality Date  . CESAREAN SECTION WITH BILATERAL TUBAL LIGATION Bilateral 07/20/2016   Procedure: REPEAT CESAREAN SECTION WITH BILATERAL TUBAL LIGATION;  Surgeon: Jaymes Graff, MD;  Location: [facility];  Service: Obstetrics;  Laterality: Bilateral;  Provider requests RNFA -ap  . NO PAST SURGERIES      Family History  Problem Relation Age of Onset  . Hearing loss Neg Hx     Social history:  reports that she has quit smoking. She has quit using smokeless tobacco. She reports that she drinks alcohol. She reports that she does not use drugs.  Medications:  Prior to Admission medications   Medication Sig Start Date End Date Taking? Authorizing Provider  fluticasone (FLONASE) 50 MCG/ACT nasal spray Place 2 sprays into both nostrils daily. 09/01/17  Yes Domenick Gong, MD  nortriptyline (PAMELOR) 50 MG capsule Take 2 capsules (100 mg total) by mouth at bedtime. 02/21/18  Yes Creta Levin, Clement Sayres  A, MD  oxymetazoline (AFRIN NASAL SPRAY) 0.05 % nasal spray Place 1 spray into both nostrils 2 (two) times daily. 02/14/18  Yes Benjiman CoreWiseman, Brittany D, PA-C  SUMAtriptan (IMITREX) 100 MG tablet Take 1 tablet (100 mg total) by mouth 2 (two) times daily as needed for up to 1 dose for migraine. 03/16/18   York SpanielWillis, Charles K, MD  topiramate (TOPAMAX) 25 MG tablet Take one tablet at night for one week, then take 2 tablets at night for one week, then take 3 tablets at night. 03/16/18   York SpanielWillis, Charles K, MD      Allergies  Allergen  Reactions  . Pollen Extract Itching    ROS:  Out of a complete 14 system review of symptoms, the patient complains only of the following symptoms, and all other reviewed systems are negative.  Headache Depression, anxiety, not enough sleep, decreased energy Insomnia Allergies, runny nose  Blood pressure 112/78, pulse 76, height 5' (1.524 m), weight 148 lb (67.1 kg), last menstrual period 03/03/2018, not currently breastfeeding.  Physical Exam  General: The patient is alert and cooperative at the time of the examination.  Eyes: Pupils are equal, round, and reactive to light. Discs are flat bilaterally.  Neck: The neck is supple, no carotid bruits are noted.  Respiratory: The respiratory examination is clear.  Cardiovascular: The cardiovascular examination reveals a regular rate and rhythm, no obvious murmurs or rubs are noted.  Skin: Extremities are without significant edema.  Neurologic Exam  Mental status: The patient is alert and oriented x 3 at the time of the examination. The patient has apparent normal recent and remote memory, with an apparently normal attention span and concentration ability.  Cranial nerves: Facial symmetry is present. There is good sensation of the face to pinprick and soft touch bilaterally. The strength of the facial muscles and the muscles to head turning and shoulder shrug are normal bilaterally. Speech is well enunciated, no aphasia or dysarthria is noted. Extraocular movements are full. Visual fields are full. The tongue is midline, and the patient has symmetric elevation of the soft palate. No obvious hearing deficits are noted.  Motor: The motor testing reveals 5 over 5 strength of all 4 extremities. Good symmetric motor tone is noted throughout.  Sensory: Sensory testing is intact to pinprick, soft touch, vibration sensation, and position sense on all 4 extremities. No evidence of extinction is noted.  Coordination: Cerebellar testing reveals  good finger-nose-finger and heel-to-shin bilaterally.  Gait and station: Gait is normal. Tandem gait is normal. Romberg is negative. No drift is seen.  Reflexes: Deep tendon reflexes are symmetric and normal bilaterally. Toes are downgoing bilaterally.   Assessment/Plan:  1.  Common migraine headache, intractable  The patient is using quite a bit of caffeinated products throughout the day and throughout the week.  I have asked her to stop using Excedrin Migraine, she may use ibuprofen instead, I will give her a prescription for the Imitrex 100 mg tablets, she may take one half or one quarter of a tablet for her migraine if needed.  We will switch over to Topamax, she will reduce the nortriptyline dose to 50 mg at night.  She will call for any dose adjustments of the Topamax.  She will follow-up here in about 3 months.  I fear that she may be at risk for losing her job if we cannot gain control of her headaches quickly.  Marlan Palau. Keith Willis MD 03/16/2018 10:06 AM  [facility] [address]  [place], [place] [address]  Phone [phone] Fax [phone]

## 2018-03-16 NOTE — Patient Instructions (Signed)
We will start Topamax for the headache.  Try to stop using Excedrine Migraine for now. Use ibuprofen instead.  Reduce the Nortriptyline to 50 mg at night.  Topamax (topiramate) is a seizure medication that has an FDA approval for seizures and for migraine headache. Potential side effects of this medication include weight loss, cognitive slowing, tingling in the fingers and toes, and carbonated drinks will taste bad. If any significant side effects are noted on this drug, please contact our office.

## 2018-07-10 NOTE — Progress Notes (Signed)
[facility]  PATIENT: [name] [name] DOB: 1986/01/26   REASON FOR VISIT: Follow-up for migraine HISTORY FROM: Patient    HISTORY OF PRESENT ILLNESS:UPDATE 10/10/2019CM [name] [name], 32 year old female returns for follow-up with a history of migraine headaches.  She also has issues with chronic insomnia.  She was placed on Topamax at her last visit and is now taking 75 mg daily.  Her headaches have been reduced but she still having about 4-5 headaches per month which is down from 10-15.  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  She has had to do a lot of overtime and she says it is stressful.  She uses Imitrex acutely.  She has just started to work out for exercise.  She is on nortriptyline by her primary care.  She has FMLA to fill out.  There is no change in her headache pattern.  She is not aware of any foods that cause problems she denies any neck stiffness with a headache.  She returns for reevaluation 6/14/19KWMs. [name] [name] is a 32 year old right-handed female with a history of intractable migraine headache.  The patient also reports chronic issues with insomnia.  She has had headaches off and on since 2008, but her headaches have become more frequent recently.  The patient over the last 9 months has had at least 2-3 headaches a week, she may have anywhere from 10-15 headache days a month.  She has found that even low-dose Imitrex taking 25 mg will be quite effective for controlling the headache, she also takes Excedrin migraine when she does not have the Imitrex, this offers modest benefit.  Currently, she is taking up to 6 Excedrin Migraine daily when she gets the headache, she may take over 18 to 20 tablets a week.  She also drinks a cup of coffee each morning, and she may have 4 or 5 soft drinks a week.  The patient has been missing significant amounts of work, she has been using up her FMLA, she is out of days to take off.  She was placed on nortriptyline in  January of 2019, she was worked up to a 100 mg nightly dose which does not help her insomnia and has not helped her headache.  The patient has never been on any other medication to help prevent her headaches previously.  The patient reports the headaches are left frontal in nature, they are associated with photophobia and some phonophobia, she does have nausea usually without vomiting.  The patient reports no visual changes, numbness or weakness, speech alteration, but she does have some trouble with concentration during the headache.  The patient usually has a headache throughout the day, sometimes she may wake up at night with a headache.  She is sent to this office for further evaluation.  She denies any family history of migraine.  She denies neck stiffness with the headache.  The patient has no definite activators for the headache, sometimes she may get a headache if she does not eat properly, otherwise the headaches appear to be random in nature.   REVIEW OF SYSTEMS: Full 14 system review of systems performed and notable only for those listed, all others are neg:  Constitutional: neg  Cardiovascular: neg Ear/Nose/Throat: neg  Skin: neg Eyes: neg Respiratory: neg Gastroitestinal: neg  Hematology/Lymphatic: neg  Endocrine: neg Musculoskeletal:neg Allergy/Immunology: neg Neurological: Headache Psychiatric: neg Sleep : Chronic insomnia   ALLERGIES: Allergies  Allergen Reactions  . Pollen Extract Itching    HOME MEDICATIONS:  Outpatient Medications Prior to Visit  Medication Sig Dispense Refill  . fluticasone (FLONASE) 50 MCG/ACT nasal spray Place 2 sprays into both nostrils daily. 16 g 0  . nortriptyline (PAMELOR) 50 MG capsule Take 2 capsules (100 mg total) by mouth at bedtime. 60 capsule 0  . SUMAtriptan (IMITREX) 100 MG tablet Take 1 tablet (100 mg total) by mouth 2 (two) times daily as needed for up to 1 dose for migraine. 10 tablet 3  . topiramate (TOPAMAX) 100 MG tablet Take  100 mg by mouth daily.    Marland Kitchen topiramate (TOPAMAX) 25 MG tablet Take one tablet at night for one week, then take 2 tablets at night for one week, then take 3 tablets at night. (Patient taking differently: Take 75 mg by mouth daily. Take one tablet at night for one week, then take 2 tablets at night for one week, then take 3 tablets at night.) 90 tablet 3  . oxymetazoline (AFRIN NASAL SPRAY) 0.05 % nasal spray Place 1 spray into both nostrils 2 (two) times daily. 30 mL 0   No facility-administered medications prior to visit.     PAST MEDICAL HISTORY: Past Medical History:  Diagnosis Date  . Allergy   . Common migraine with intractable migraine 03/16/2018  . Depression   . Gonorrhea   . Headache(784.0)   . Infection    UTI  . Shoulder dystocia, delivered, current hospitalization 06/14/2014  . Vaginal Pap smear, abnormal    f/u ok    PAST SURGICAL HISTORY: Past Surgical History:  Procedure Laterality Date  . CESAREAN SECTION WITH BILATERAL TUBAL LIGATION Bilateral 07/20/2016   Procedure: REPEAT CESAREAN SECTION WITH BILATERAL TUBAL LIGATION;  Surgeon: Jaymes Graff, MD;  Location: [facility];  Service: Obstetrics;  Laterality: Bilateral;  Provider requests RNFA -ap  . NO PAST SURGERIES      FAMILY HISTORY: Family History  Problem Relation Age of Onset  . Hearing loss Neg Hx     SOCIAL HISTORY: Social History   Socioeconomic History  . Marital status: Married    Spouse name: Not on file  . Number of children: 3  . Years of education: Not on file  . Highest education level: Not on file  Occupational History  . Not on file  Social Needs  . Financial resource strain: Not on file  . Food insecurity:    Worry: Not on file    Inability: Not on file  . Transportation needs:    Medical: Not on file    Non-medical: Not on file  Tobacco Use  . Smoking status: Former Games developer  . Smokeless tobacco: Former Neurosurgeon  . Tobacco comment: not every day smoker, prior to the preg    Substance and Sexual Activity  . Alcohol use: Yes    Comment: occ  . Drug use: No  . Sexual activity: Yes    Birth control/protection: None  Lifestyle  . Physical activity:    Days per week: Not on file    Minutes per session: Not on file  . Stress: Not on file  Relationships  . Social connections:    Talks on phone: Not on file    Gets together: Not on file    Attends religious service: Not on file    Active member of club or organization: Not on file    Attends meetings of clubs or organizations: Not on file    Relationship status: Not on file  . Intimate partner violence:    Fear of  current or ex partner: Not on file    Emotionally abused: Not on file    Physically abused: Not on file    Forced sexual activity: Not on file  Other Topics Concern  . Not on file  Social History Narrative  . Not on file     PHYSICAL EXAM  Vitals:   07/12/18 0810  BP: 136/90  Pulse: 81  Weight: 145 lb 3.2 oz (65.9 kg)  Height: 5' (1.524 m)   Body mass index is 28.36 kg/m.  Generalized: Well developed, in no acute distress  Skin no rash or edema Neurological examination   Mentation: Alert oriented to time, place, history taking. Attention span and concentration appropriate. Recent and remote memory intact.  Follows all commands speech and language fluent.   Cranial nerve II-XII: Pupils were equal round reactive to light extraocular movements were full, visual field were full on confrontational test. Facial sensation and strength were normal. hearing was intact to finger rubbing bilaterally. Uvula tongue midline. head turning and shoulder shrug were normal and symmetric.Tongue protrusion into cheek strength was normal. Motor: normal bulk and tone, full strength in the BUE, BLE, Sensory: normal and symmetric to light touch,  Coordination: finger-nose-finger, heel-to-shin bilaterally, no dysmetria Reflexes: Brachioradialis 2/2, biceps 2/2, triceps 2/2, patellar 2/2, Achilles 2/2,  plantar responses were flexor bilaterally. Gait and Station: Rising up from seated position without assistance, normal stance,  moderate stride, good arm swing, smooth turning, able to perform tiptoe, and heel walking without difficulty. Tandem gait is steady  DIAGNOSTIC DATA (LABS, IMAGING, TESTING) - I reviewed patient records, labs, notes, testing and imaging myself where available.  Lab Results  Component Value Date   WBC 7.6 07/25/2017   HGB 13.8 07/25/2017   HCT 40.4 07/25/2017   MCV 85.2 07/25/2017   PLT 216 07/25/2017      Component Value Date/Time   NA 139 07/25/2017 1544   K 4.3 07/25/2017 1544   CL 103 07/25/2017 1544   CO2 26 07/25/2017 1544   GLUCOSE 93 07/25/2017 1544   GLUCOSE 65 (L) 04/10/2014 1556   BUN 13 07/25/2017 1544   CREATININE 0.77 07/25/2017 1544   CALCIUM 10.0 07/25/2017 1544   PROT 8.4 (H) 07/25/2017 1544   ALBUMIN 4.6 07/25/2017 1544   AST 26 07/25/2017 1544   ALT 19 07/25/2017 1544   ALKPHOS 53 07/25/2017 1544   BILITOT 0.6 07/25/2017 1544   GFRNONAA >60 07/25/2017 1544   GFRAA >60 07/25/2017 1544     ASSESSMENT AND PLAN  32 y.o. year old female here to follow-up for migraine headaches which have been reduced from 10-15 a month to 4 to 5 months on Topamax   PLAN: Increase Topamax to 100 mg at bedtime Continue Imitrex acutely Given list of migraine triggers Track migraines on migraine tracker Follow-up in 4 months Nilda Riggs, [facility], [employer], APRN  [facility] [address], [address] [place], [place] [address] [phone]

## 2018-07-12 ENCOUNTER — Ambulatory Visit: Payer: BLUE CROSS/BLUE SHIELD | Admitting: Nurse Practitioner

## 2018-07-12 ENCOUNTER — Encounter: Payer: Self-pay | Admitting: Nurse Practitioner

## 2018-07-12 VITALS — BP 136/90 | HR 81 | Ht 60.0 in | Wt 145.2 lb

## 2018-07-12 DIAGNOSIS — G43019 Migraine without aura, intractable, without status migrainosus: Secondary | ICD-10-CM | POA: Diagnosis not present

## 2018-07-12 MED ORDER — SUMATRIPTAN SUCCINATE 100 MG PO TABS: ORAL_TABLET | ORAL | 4 refills | Status: DC

## 2018-07-12 MED ORDER — TOPIRAMATE 100 MG PO TABS: 100.0000 mg | ORAL_TABLET | Freq: Every day | ORAL | 4 refills | Status: DC

## 2018-07-12 NOTE — Patient Instructions (Signed)
Increase Topamax to 100 mg at bedtime Continue Imitrex acutely Given list of migraine triggers Track migraines on migraine tracker Try to exercise daily Follow-up in 4 months

## 2018-10-04 ENCOUNTER — Encounter: Payer: Self-pay | Admitting: Nurse Practitioner

## 2018-10-04 ENCOUNTER — Ambulatory Visit: Payer: BLUE CROSS/BLUE SHIELD | Admitting: Nurse Practitioner

## 2018-10-04 DIAGNOSIS — G43009 Migraine without aura, not intractable, without status migrainosus: Secondary | ICD-10-CM | POA: Diagnosis not present

## 2018-10-04 MED ORDER — FROVATRIPTAN SUCCINATE 2.5 MG PO TABS: 2.5000 mg | ORAL_TABLET | ORAL | 4 refills | Status: DC | PRN

## 2018-10-04 NOTE — Progress Notes (Signed)
[facility]  PATIENT: [name] [name] DOB: 11/06/1985   REASON FOR VISIT: Follow-up for migraine HISTORY FROM: Patient    HISTORY OF PRESENT ILLNESS:UPDATE 1/2/2020CM [name] [name], 33 year old female returns for final follow-up with a history of migraine headaches.  Her headaches have been reduced on Topamax she had a total of 10 headaches for the month of November and 6 headaches for the month of December.  She did not miss any work due to her headaches.  She claims Imitrex does not work as it has in the past.  She remains on Topamax 100 mg at night she is also on Pamelor 50mg  2 tablets at bedtime by her primary care.  1 of her food triggers is aged cheese.  She has been keeping a record of her headaches and they generally occur during her menstrual cycles.  She returns for reevaluation   UPDATE 10/10/2019CM [name] [name], 33 year old female returns for follow-up with a history of migraine headaches.  She also has issues with chronic insomnia.  She was placed on Topamax at her last visit and is now taking 75 mg daily.  Her headaches have been reduced but she still having about 4-5 headaches per month which is down from 10-15.  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  She uses Imitrex acutely.  She has just started to work out for exercise.  She is on nortriptyline by her primary care.  She has FMLA to fill out.  There is no change in her headache pattern.  She is not aware of any foods that cause problems she denies any neck stiffness with a headache.  She returns for reevaluation 6/14/19KWMs. [name] [name] is a 33 year old right-handed female with a history of intractable migraine headache.  The patient also reports chronic issues with insomnia.  She has had headaches off and on since 2008, but her headaches have become more frequent recently.  The patient over the last 9 months has had at least 2-3 headaches  a week, she may have anywhere from 10-15 headache days a month.  She has found that even low-dose Imitrex taking 25 mg will be quite effective for controlling the headache, she also takes Excedrin migraine when she does not have the Imitrex, this offers modest benefit.  Currently, she is taking up to 6 Excedrin Migraine daily when she gets the headache, she may take over 18 to 20 tablets a week.  She also drinks a cup of coffee each morning, and she may have 4 or 5 soft drinks a week.  The patient has been missing significant amounts of work, she has been using up her FMLA, she is out of days to take off.  She was placed on nortriptyline in January of 2019, she was worked up to a 100 mg nightly dose which does not help her insomnia and has not helped her headache.  The patient has never been on any other medication to help prevent her headaches previously.  The patient reports the headaches are left frontal in nature, they are associated with photophobia and some phonophobia, she does have nausea usually without vomiting.  The patient reports no visual changes, numbness or weakness, speech alteration, but she does have some trouble with concentration during the headache.  The patient usually has a headache throughout the day, sometimes she may wake up at night with a headache.  She is sent to  this office for further evaluation.  She denies any family history of migraine.  She denies neck stiffness with the headache.  The patient has no definite activators for the headache, sometimes she may get a headache if she does not eat properly, otherwise the headaches appear to be random in nature.   REVIEW OF SYSTEMS: Full 14 system review of systems performed and notable only for those listed, all others are neg:  Constitutional: neg  Cardiovascular: neg Ear/Nose/Throat: neg  Skin: neg Eyes: neg Respiratory: neg Gastroitestinal: neg  Hematology/Lymphatic: neg  Endocrine:  neg Musculoskeletal:neg Allergy/Immunology: neg Neurological: Headache Psychiatric: neg Sleep : Chronic insomnia   ALLERGIES: Allergies  Allergen Reactions  . Pollen Extract Itching    HOME MEDICATIONS: Outpatient Medications Prior to Visit  Medication Sig Dispense Refill  . fluticasone (FLONASE) 50 MCG/ACT nasal spray Place 2 sprays into both nostrils daily. 16 g 0  . nortriptyline (PAMELOR) 50 MG capsule Take 2 capsules (100 mg total) by mouth at bedtime. 60 capsule 0  . SUMAtriptan (IMITREX) 100 MG tablet 1 prn acute headache no more than 2 in 24 hrs 10 tablet 4  . topiramate (TOPAMAX) 100 MG tablet Take 1 tablet (100 mg total) by mouth daily. 30 tablet 4   No facility-administered medications prior to visit.     PAST MEDICAL HISTORY: Past Medical History:  Diagnosis Date  . Allergy   . Common migraine with intractable migraine 03/16/2018  . Depression   . Gonorrhea   . Headache(784.0)   . Infection    UTI  . Shoulder dystocia, delivered, current hospitalization 06/14/2014  . Vaginal Pap smear, abnormal    f/u ok    PAST SURGICAL HISTORY: Past Surgical History:  Procedure Laterality Date  . CESAREAN SECTION WITH BILATERAL TUBAL LIGATION Bilateral 07/20/2016   Procedure: REPEAT CESAREAN SECTION WITH BILATERAL TUBAL LIGATION;  Surgeon: Jaymes Graff, MD;  [facility]: [facility];  Service: Obstetrics;  Laterality: Bilateral;  Provider requests RNFA -ap  . NO PAST SURGERIES      FAMILY HISTORY: Family History  Problem Relation Age of Onset  . Hearing loss Neg Hx     SOCIAL HISTORY: Social History   Socioeconomic History  . Marital status: Married    Spouse name: Not on file  . Number of children: 3  . Years of education: Not on file  . Highest education level: Not on file  Occupational History  . Not on file  Social Needs  . Financial resource strain: Not on file  . Food insecurity:    Worry: Not on file    Inability: Not on file  .  Transportation needs:    Medical: Not on file    Non-medical: Not on file  Tobacco Use  . Smoking status: Former Games developer  . Smokeless tobacco: Former Neurosurgeon  . Tobacco comment: not every day smoker, prior to the preg  Substance and Sexual Activity  . Alcohol use: Yes    Comment: occ  . Drug use: No  . Sexual activity: Yes    Birth control/protection: None  Lifestyle  . Physical activity:    Days per week: Not on file    Minutes per session: Not on file  . Stress: Not on file  Relationships  . Social connections:    Talks on phone: Not on file    Gets together: Not on file    Attends religious service: Not on file    Active member of club or organization: Not on file  Attends meetings of clubs or organizations: Not on file    Relationship status: Not on file  . Intimate partner violence:    Fear of current or ex partner: Not on file    Emotionally abused: Not on file    Physically abused: Not on file    Forced sexual activity: Not on file  Other Topics Concern  . Not on file  Social History Narrative  . Not on file     PHYSICAL EXAM  Vitals:   10/04/18 1519  BP: 122/78  Pulse: 94  Weight: 142 lb 12.8 oz (64.8 kg)  Height: 5' (1.524 m)   Body mass index is 27.89 kg/m.  Generalized: Well developed, in no acute distress  Skin no rash or edema Neurological examination   Mentation: Alert oriented to time, place, history taking. Attention span and concentration appropriate. Recent and remote memory intact.  Follows all commands speech and language fluent.   Cranial nerve II-XII: Pupils were equal round reactive to light extraocular movements were full, visual field were full on confrontational test. Facial sensation and strength were normal. hearing was intact to finger rubbing bilaterally. Uvula tongue midline. head turning and shoulder shrug were normal and symmetric.Tongue protrusion into cheek strength was normal. Motor: normal bulk and tone, full strength in the  BUE, BLE, Sensory: normal and symmetric to light touch,  Coordination: finger-nose-finger, heel-to-shin bilaterally, no dysmetria Reflexes: Brachioradialis 2/2, biceps 2/2, triceps 2/2, patellar 2/2, Achilles 2/2, plantar responses were flexor bilaterally. Gait and Station: Rising up from seated position without assistance, normal stance,  moderate stride, good arm swing, smooth turning, able to perform tiptoe, and heel walking without difficulty. Tandem gait is steady  DIAGNOSTIC DATA (LABS, IMAGING, TESTING) - I reviewed patient records, labs, notes, testing and imaging myself where available.  Lab Results  Component Value Date   WBC 7.6 07/25/2017   HGB 13.8 07/25/2017   HCT 40.4 07/25/2017   MCV 85.2 07/25/2017   PLT 216 07/25/2017      Component Value Date/Time   NA 139 07/25/2017 1544   K 4.3 07/25/2017 1544   CL 103 07/25/2017 1544   CO2 26 07/25/2017 1544   GLUCOSE 93 07/25/2017 1544   GLUCOSE 65 (L) 04/10/2014 1556   BUN 13 07/25/2017 1544   CREATININE 0.77 07/25/2017 1544   CALCIUM 10.0 07/25/2017 1544   PROT 8.4 (H) 07/25/2017 1544   ALBUMIN 4.6 07/25/2017 1544   AST 26 07/25/2017 1544   ALT 19 07/25/2017 1544   ALKPHOS 53 07/25/2017 1544   BILITOT 0.6 07/25/2017 1544   GFRNONAA >60 07/25/2017 1544   GFRAA >60 07/25/2017 1544     ASSESSMENT AND PLAN  33 y.o. year old female here to follow-up for migraine headaches which have been reduced from 10-15 a month to 6 for the month of December.  She has realized that many of her headaches occur during her cycle.  Imitrex does not work acutely.     PLAN: Continue Topamax to 100 mg at bedtime as preventive Continue Pamelor 50 mg 2 capsules at bedtime this is also a preventive Stop Imitrex  Change acute medication to Frova since many of her headaches occur during her menstrual cycle Continue to avoid  migraine triggers Follow-up in 4 months Patient has FMLA to fill out Nilda Riggs, [facility], [facility],  APRN  [facility] [address] [place], [place] [address] [phone]

## 2018-10-04 NOTE — Patient Instructions (Signed)
Continue Topamax to 100 mg at bedtime as preventive Continue Pamelor 50 mg 2 capsules at bedtime this is also a preventive Stop Imitrex  Change acute medication to Frova since many of her headaches occur during her menstrual cycle Continue to avoid  migraine triggers Follow-up in 4 months Patient has FMLA to fill out

## 2018-10-04 NOTE — Progress Notes (Signed)
I have read the note, and I agree with the clinical assessment and plan.  Charles K Willis   

## 2018-10-05 DIAGNOSIS — Z0289 Encounter for other administrative examinations: Secondary | ICD-10-CM

## 2018-10-11 ENCOUNTER — Encounter: Payer: Self-pay | Admitting: *Deleted

## 2018-10-11 NOTE — Progress Notes (Signed)
[employer] papers completed and signed, sent to medical records for faxing.  The patient is aware per my chart message; she wants a copy. D Settle in MR is aware she wants a copy.

## 2018-10-15 ENCOUNTER — Encounter: Payer: Self-pay | Admitting: Family Medicine

## 2018-10-15 ENCOUNTER — Other Ambulatory Visit: Payer: Self-pay | Admitting: *Deleted

## 2018-10-15 DIAGNOSIS — F411 Generalized anxiety disorder: Secondary | ICD-10-CM

## 2018-10-18 ENCOUNTER — Telehealth: Payer: Self-pay | Admitting: *Deleted

## 2018-10-18 NOTE — Telephone Encounter (Signed)
Received my chart message: I recieved word back about my FMLA paperwork and it came back incomplete.     Question Number 19. There is no start or end date.     When I came in 2 weeks ago, my last absence was Dec 28. So Sep 29 2018- Sep 30 2019.Marland Kitchen... Spoke with patient to clarify this was needed for intermittent leave. She stated her employer requires either a 6 month or 12 month start/end date for intermittent FMLA. I advised will addend form and fax in. She stated it does not need to be signed and dated again by provider. She verbalized understanding of call. Forms addended as requested and successfully faxed to [facility].[employer], [phone].

## 2018-10-24 ENCOUNTER — Encounter: Payer: Self-pay | Admitting: Nurse Practitioner

## 2018-11-02 ENCOUNTER — Telehealth: Payer: Self-pay | Admitting: *Deleted

## 2018-11-02 NOTE — Telephone Encounter (Signed)
The patient has sent my chart messages re: increased headaches. Per NP, I called her and schedule her to be seen Monday. She understands to arriv 3 0 minutes early. Patient verbalized understanding, appreciation.

## 2018-11-05 ENCOUNTER — Ambulatory Visit: Payer: BLUE CROSS/BLUE SHIELD | Admitting: Nurse Practitioner

## 2018-11-05 ENCOUNTER — Encounter: Payer: Self-pay | Admitting: Nurse Practitioner

## 2018-11-05 VITALS — BP 110/79 | HR 97 | Ht 60.0 in | Wt 139.0 lb

## 2018-11-05 DIAGNOSIS — G43019 Migraine without aura, intractable, without status migrainosus: Secondary | ICD-10-CM | POA: Diagnosis not present

## 2018-11-05 DIAGNOSIS — G43009 Migraine without aura, not intractable, without status migrainosus: Secondary | ICD-10-CM

## 2018-11-05 MED ORDER — FREMANEZUMAB-VFRM 225 MG/1.5ML ~~LOC~~ SOSY: 225.0000 mg | PREFILLED_SYRINGE | SUBCUTANEOUS | 6 refills | Status: DC

## 2018-11-05 MED ORDER — FREMANEZUMAB-VFRM 225 MG/1.5ML ~~LOC~~ SOSY
225.0000 mg | PREFILLED_SYRINGE | Freq: Once | SUBCUTANEOUS | Status: AC
  Administered 2018-11-05: 225 mg via SUBCUTANEOUS

## 2018-11-05 NOTE — Progress Notes (Signed)
[facility]  PATIENT: [name] HammansYan Long [name] DOB: 04/23/1986   REASON FOR VISIT: Follow-up for migraine HISTORY FROM: Patient    HISTORY OF PRESENT ILLNESS:UPDATE 2/3/2020CM [name] [name], 33 year old female returns for follow-up with a history of migraine headaches.  For the month of January she had known headaches.  Her acute medication was switched to Frova which was beneficial.  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  She is currently on Topamax and Pamelor as preventatives.  She needs more time on her FMLA for excused absences from work.  She returns for reevaluation   UPDATE 1/2/2020CM [name] [name], 33 year old female returns for final follow-up with a history of migraine headaches.  Her headaches have been reduced on Topamax she had a total of 10 headaches for the month of November and 6 headaches for the month of December.  She did not miss any work due to her headaches.  She claims Imitrex does not work as it has in the past.  She remains on Topamax 100 mg at night she is also on Pamelor 50mg  2 tablets at bedtime by her primary care.  1 of her food triggers is aged cheese.  She has been keeping a record of her headaches and they generally occur during her menstrual cycles.  She returns for reevaluation   UPDATE 10/10/2019CM [name] [name], 33 year old female returns for follow-up with a history of migraine headaches.  She also has issues with chronic insomnia.  She was placed on Topamax at her last visit and is now taking 75 mg daily.  Her headaches have been reduced but she still having about 4-5 headaches per month which is down from 10-15.  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  She uses Imitrex acutely.  She has just started to work out for exercise.  She is on nortriptyline by her primary care.  She has  FMLA to fill out.  There is no change in her headache pattern.  She is not aware of any foods that cause problems she denies any neck stiffness with a headache.  She returns for reevaluation 6/14/19KWMs. [name] [name] is a 33 year old right-handed female with a history of intractable migraine headache.  The patient also reports chronic issues with insomnia.  She has had headaches off and on since 2008, but her headaches have become more frequent recently.  The patient over the last 9 months has had at least 2-3 headaches a week, she may have anywhere from 10-15 headache days a month.  She has found that even low-dose Imitrex taking 25 mg will be quite effective for controlling the headache, she also takes Excedrin migraine when she does not have the Imitrex, this offers modest benefit.  Currently, she is taking up to 6 Excedrin Migraine daily when she gets the headache, she may take over 18 to 20 tablets a week.  She also drinks a cup of coffee each morning, and she may have 4 or 5 soft drinks a week.  The patient has been missing significant amounts of work, she has been using up her FMLA, she is out of days to take off.  She was placed on nortriptyline in January of 2019, she was worked up to a 100 mg nightly dose which does not help her insomnia and has not  helped her headache.  The patient has never been on any other medication to help prevent her headaches previously.  The patient reports the headaches are left frontal in nature, they are associated with photophobia and some phonophobia, she does have nausea usually without vomiting.  The patient reports no visual changes, numbness or weakness, speech alteration, but she does have some trouble with concentration during the headache.  The patient usually has a headache throughout the day, sometimes she may wake up at night with a headache.  She is sent to this office for further evaluation.  She denies any family history of migraine.  She denies neck stiffness with  the headache.  The patient has no definite activators for the headache, sometimes she may get a headache if she does not eat properly, otherwise the headaches appear to be random in nature.   REVIEW OF SYSTEMS: Full 14 system review of systems performed and notable only for those listed, all others are neg:  Constitutional: neg  Cardiovascular: neg Ear/Nose/Throat: neg  Skin: neg Eyes: Sensitivity to light Respiratory: neg Gastroitestinal: neg  Hematology/Lymphatic: neg  Endocrine: neg Musculoskeletal:neg Allergy/Immunology: neg Neurological: Headache Psychiatric: Depression Sleep : Chronic insomnia   ALLERGIES: Allergies  Allergen Reactions  . Pollen Extract Itching    HOME MEDICATIONS: Outpatient Medications Prior to Visit  Medication Sig Dispense Refill  . fluticasone (FLONASE) 50 MCG/ACT nasal spray Place 2 sprays into both nostrils daily. 16 g 0  . frovatriptan (FROVA) 2.5 MG tablet Take 1 tablet (2.5 mg total) by mouth as needed for migraine. If recurs, may repeat after 2 hours. Max of 2 tabs in 24 hours. 10 tablet 4  . nortriptyline (PAMELOR) 50 MG capsule Take 2 capsules (100 mg total) by mouth at bedtime. 60 capsule 0  . topiramate (TOPAMAX) 100 MG tablet Take 1 tablet (100 mg total) by mouth daily. 30 tablet 4   No facility-administered medications prior to visit.     PAST MEDICAL HISTORY: Past Medical History:  Diagnosis Date  . Allergy   . Common migraine with intractable migraine 03/16/2018  . Depression   . Gonorrhea   . Headache(784.0)   . Infection    UTI  . Shoulder dystocia, delivered, current hospitalization 06/14/2014  . Vaginal Pap smear, abnormal    f/u ok    PAST SURGICAL HISTORY: Past Surgical History:  Procedure Laterality Date  . CESAREAN SECTION WITH BILATERAL TUBAL LIGATION Bilateral 07/20/2016   Procedure: REPEAT CESAREAN SECTION WITH BILATERAL TUBAL LIGATION;  Surgeon: Jaymes Graff, MD;  [facility]: [facility];  Service:  Obstetrics;  Laterality: Bilateral;  Provider requests RNFA -ap  . NO PAST SURGERIES      FAMILY HISTORY: Family History  Problem Relation Age of Onset  . Hearing loss Neg Hx     SOCIAL HISTORY: Social History   Socioeconomic History  . Marital status: Married    Spouse name: Not on file  . Number of children: 3  . Years of education: Not on file  . 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 Financial resource strain: Not on file  . Food insecurity:    Worry: Not on file    Inability: Not on file  . Transportation needs:    Medical: Not on file    Non-medical: Not on file  Tobacco Use  . Smoking status: Former Games developer  . Smokeless tobacco: Former Neurosurgeon  . Tobacco comment:  not every day smoker, prior to the preg  Substance and Sexual Activity  . Alcohol use: Yes    Comment: occ  . Drug use: No  . Sexual activity: Yes    Birth control/protection: None  Lifestyle  . Physical activity:    Days per week: Not on file    Minutes per session: Not on file  . Stress: Not on file  Relationships  . Social connections:    Talks on phone: Not on file    Gets together: Not on file    Attends religious service: Not on file    Active member of club or organization: Not on file    Attends meetings of clubs or organizations: Not on file    Relationship status: Not on file  . Intimate partner violence:    Fear of current or ex partner: Not on file    Emotionally abused: Not on file    Physically abused: Not on file    Forced sexual activity: Not on file  Other Topics Concern  . Not on file  Social History Narrative  . Not on file     PHYSICAL EXAM  Vitals:   11/05/18 0947  BP: 110/79  Pulse: 97  Weight: 139 lb (63 kg)  Height: 5' (1.524 m)   Body mass index is 27.15 kg/m.  Generalized: Well developed, in no acute distress  Skin no rash or edema Neurological examination   Mentation:  Alert oriented to time, place, history taking. Attention span and concentration appropriate. Recent and remote memory intact.  Follows all commands speech and language fluent.   Cranial nerve II-XII: Pupils were equal round reactive to light extraocular movements were full, visual field were full on confrontational test. Facial sensation and strength were normal. hearing was intact to finger rubbing bilaterally. Uvula tongue midline. head turning and shoulder shrug were normal and symmetric.Tongue protrusion into cheek strength was normal. Motor: normal bulk and tone, full strength in the BUE, BLE, Sensory: normal and symmetric to light touch,  Coordination: finger-nose-finger, heel-to-shin bilaterally, no dysmetria Reflexes: Brachioradialis 2/2, biceps 2/2, triceps 2/2, patellar 2/2, Achilles 2/2, plantar responses were flexor bilaterally. Gait and Station: Rising up from seated position without assistance, normal stance,  moderate stride, good arm swing, smooth turning, able to perform tiptoe, and heel walking without difficulty. Tandem gait is steady  DIAGNOSTIC DATA (LABS, IMAGING, TESTING) - I reviewed patient records, labs, notes, testing and imaging myself where available.  Lab Results  Component Value Date   WBC 7.6 07/25/2017   HGB 13.8 07/25/2017   HCT 40.4 07/25/2017   MCV 85.2 07/25/2017   PLT 216 07/25/2017      Component Value Date/Time   NA 139 07/25/2017 1544   K 4.3 07/25/2017 1544   CL 103 07/25/2017 1544   CO2 26 07/25/2017 1544   GLUCOSE 93 07/25/2017 1544   GLUCOSE 65 (L) 04/10/2014 1556   BUN 13 07/25/2017 1544   CREATININE 0.77 07/25/2017 1544   CALCIUM 10.0 07/25/2017 1544   PROT 8.4 (H) 07/25/2017 1544   ALBUMIN 4.6 07/25/2017 1544   AST 26 07/25/2017 1544   ALT 19 07/25/2017 1544   ALKPHOS 53 07/25/2017 1544   BILITOT 0.6 07/25/2017 1544   GFRNONAA >60 07/25/2017 1544   GFRAA >60 07/25/2017 1544     ASSESSMENT AND PLAN  33 y.o. year old female  here to follow-up for migraine headaches which have been reduced from 10-15 a month to 6 for the month of  December.  She had none for the month of January.  Some of her headaches occur during her cycle but she also has to swing shifts and has more headaches when working the shift at night Frova has helped acutely.   PLAN: Continue Topamax to 100 mg at bedtime as preventive Continue Pamelor 50 mg 2 capsules at bedtime this is also a preventive Continue  to Frova since many of her headaches occur during her menstrual cycle Continue to avoid  migraine triggers, aged cheese Add Ajovy 225mg  SQ monthly first dose sample given in the office and instructed on administration Follow-up in 3 months Will amend her FMLA Nilda Riggs, [facility], [facility], APRN  [facility] [address], [address] [place], [place] [address] [phone]

## 2018-11-05 NOTE — Progress Notes (Signed)
Instructed pt on Ajovy injection administration.  Injected 225mg  subcutaneously to L arm after cleansing with alcohol.   Bandaid applied. Pt tolerated well.  She did not have questions at this time.  Encouraged to call if questions.

## 2018-11-05 NOTE — Patient Instructions (Signed)
Continue Topamax to 100 mg at bedtime as preventive Continue Pamelor 50 mg 2 capsules at bedtime this is also a preventive Continue  to Frova since many of her headaches occur during her menstrual cycle Continue to avoid  migraine triggers, aged cheese Add Ajovy 225mg  SQ monthly first dose sample given Follow-up in 3 months

## 2018-11-12 ENCOUNTER — Ambulatory Visit: Payer: BLUE CROSS/BLUE SHIELD | Admitting: Nurse Practitioner

## 2018-11-27 ENCOUNTER — Ambulatory Visit: Payer: BLUE CROSS/BLUE SHIELD | Admitting: Family Medicine

## 2018-11-27 ENCOUNTER — Encounter: Payer: Self-pay | Admitting: Family Medicine

## 2018-11-27 VITALS — BP 118/79 | HR 95 | Temp 99.2°F | Resp 17 | Ht 61.0 in | Wt 141.0 lb

## 2018-11-27 DIAGNOSIS — J069 Acute upper respiratory infection, unspecified: Secondary | ICD-10-CM | POA: Diagnosis not present

## 2018-11-27 DIAGNOSIS — R05 Cough: Secondary | ICD-10-CM

## 2018-11-27 DIAGNOSIS — R6889 Other general symptoms and signs: Secondary | ICD-10-CM

## 2018-11-27 DIAGNOSIS — R059 Cough, unspecified: Secondary | ICD-10-CM

## 2018-11-27 LAB — POC INFLUENZA A&B (BINAX/QUICKVUE)
Influenza A, POC: NEGATIVE
Influenza B, POC: NEGATIVE

## 2018-11-27 MED ORDER — IPRATROPIUM BROMIDE 0.03 % NA SOLN: 2.0000 | Freq: Two times a day (BID) | NASAL | 0 refills | Status: DC

## 2018-11-27 MED ORDER — BENZONATATE 100 MG PO CAPS: 100.0000 mg | ORAL_CAPSULE | Freq: Three times a day (TID) | ORAL | 0 refills | Status: DC | PRN

## 2018-11-27 NOTE — Patient Instructions (Addendum)
Drink plenty of fluids and get enough rest  Take over-the-counter Robitussin DM or Mucinex DM or 1 of the equivalent cough syrups.  DM is your best cough suppressant.  I am also going to give you some cough pills called benzonatate which you can take 1 or 2- 3 times daily as needed for cough.  These help get rid of the tickle from your throat though they do not suppress the cough as well as DM.  They can be taken especially when you go back to work so you are not coughing while you are trying to work.  Use Atrovent nose spray 2 sprays each nostril every 6 hours as needed for runny nose and congestion.  (Ipratropium)  Return if worse

## 2018-11-27 NOTE — Progress Notes (Signed)
Patient ID: [name] [name], female    DOB: 10/08/1985  Age: 33 y.o. MRN: [id number]  Chief Complaint  Patient presents with  . Nasal Congestion  . Cough  . Insomnia  . Emesis  . Depression    Subjective:   32 year old lady who developed a respiratory tract infection a couple of days ago.  She started with constant sneezing.  Then she developed nasal congestion and a persistent cough.  She coughs so hard that she cannot to keep things on her stomach at times.  She has not been wheezing.  The throat is not been very sore.  She has had some chills but no documented fevers.  She does not smoke.  She has not had a flu shot since about this time last year.  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  Current allergies, medications, problem list, past/family and social histories reviewed.  Objective:  BP 118/79   Pulse 95   Temp 99.2 F (37.3 C) (Oral)   Resp 17   Ht 5\' 1"  (1.549 m)   Wt 141 lb (64 kg)   SpO2 98%   BMI 26.64 kg/m   No major distress.  TMs are normal.  Nose congested.  Sinuses nontender.  Throat clear.  Neck supple without significant nodes.  Chest clear to auscultation.  Heart regular without murmur.  Assessment & Plan:   Assessment: 1. Cough   2. Acute upper respiratory infection   3. Flu-like symptoms       Plan: See instructions  Orders Placed This Encounter  Procedures  . POC Influenza A&B(BINAX/QUICKVUE)    Meds ordered this encounter  Medications  . ipratropium (ATROVENT) 0.03 % nasal spray    Sig: Place 2 sprays into both nostrils 2 (two) times daily.    Dispense:  30 mL    Refill:  0  . benzonatate (TESSALON) 100 MG capsule    Sig: Take 1-2 capsules (100-200 mg total) by mouth 3 (three) times daily as needed.    Dispense:  30 capsule    Refill:  0         Patient Instructions  Drink plenty of fluids and get enough rest  Take over-the-counter Robitussin DM or Mucinex DM or 1 of the equivalent cough syrups.  DM is your best  cough suppressant.  I am also going to give you some cough pills called benzonatate which you can take 1 or 2- 3 times daily as needed for cough.  These help get rid of the tickle from your throat though they do not suppress the cough as well as DM.  They can be taken especially when you go back to work so you are not coughing while you are trying to work.  Use Atrovent nose spray 2 sprays each nostril every 6 hours as needed for runny nose and congestion.  (Ipratropium)  Return if worse    Return if symptoms worsen or fail to improve.   Janace Hoard, MD 11/27/2018

## 2018-12-24 ENCOUNTER — Other Ambulatory Visit: Payer: Self-pay | Admitting: Family Medicine

## 2018-12-24 DIAGNOSIS — R6889 Other general symptoms and signs: Secondary | ICD-10-CM

## 2018-12-24 DIAGNOSIS — J069 Acute upper respiratory infection, unspecified: Secondary | ICD-10-CM

## 2018-12-24 DIAGNOSIS — R05 Cough: Secondary | ICD-10-CM

## 2018-12-24 DIAGNOSIS — R059 Cough, unspecified: Secondary | ICD-10-CM

## 2018-12-24 NOTE — Telephone Encounter (Signed)
Requested medication (s) are due for refill today: yes  Requested medication (s) are on the active medication list: yes  Last refill: 11/27/2018 60ml 0 refills  Future visit scheduled no  Notes to clinic   off protocol  Requested Prescriptions  Pending Prescriptions Disp Refills   ipratropium (ATROVENT) 0.03 % nasal spray [Pharmacy Med Name: IPRATROPIUM 0.03% NAS SP (345)] 30 mL 0    Sig: INSTILL 2 SPRAYS INTO EACH NOSTRIL TWICE DAILY     Off-Protocol Failed - 12/24/2018  5:09 PM      Failed - Medication not assigned to a protocol, review manually.      Passed - Valid encounter within last 12 months    Recent Outpatient Visits          3 weeks ago Cough   Primary Care at [facility] Sandria Bales, MD   10 months ago Chronic migraine   Primary Care at [facility], [place] A, MD   10 months ago Acute upper respiratory infection   Primary Care at [facility], [place] D, PA-C   11 months ago Chronic migraine   Primary Care at [facility], [place] A, MD   12 months ago Moderate episode of recurrent major depressive disorder [facility])   Primary Care at [facility], Manus Rudd, MD

## 2019-01-28 ENCOUNTER — Telehealth: Payer: Self-pay

## 2019-01-28 NOTE — Telephone Encounter (Signed)
Spoke with the patient to offer a sooner with Margie Ege, NP on 01/30/2019. She gave verbal consent to file her insurance and to doing a virtual webex with Maralyn Sago. E-mail has been confirmed and sent.

## 2019-01-30 ENCOUNTER — Ambulatory Visit (INDEPENDENT_AMBULATORY_CARE_PROVIDER_SITE_OTHER): Payer: BLUE CROSS/BLUE SHIELD | Admitting: Neurology

## 2019-01-30 ENCOUNTER — Encounter: Payer: Self-pay | Admitting: Neurology

## 2019-01-30 ENCOUNTER — Other Ambulatory Visit: Payer: Self-pay

## 2019-01-30 DIAGNOSIS — G43019 Migraine without aura, intractable, without status migrainosus: Secondary | ICD-10-CM

## 2019-01-30 MED ORDER — TOPIRAMATE 100 MG PO TABS: 100.0000 mg | ORAL_TABLET | Freq: Every day | ORAL | 4 refills | Status: DC

## 2019-01-30 MED ORDER — FREMANEZUMAB-VFRM 225 MG/1.5ML ~~LOC~~ SOSY: 225.0000 mg | PREFILLED_SYRINGE | SUBCUTANEOUS | 6 refills | Status: DC

## 2019-01-30 MED ORDER — NORTRIPTYLINE HCL 50 MG PO CAPS: 100.0000 mg | ORAL_CAPSULE | Freq: Every day | ORAL | 6 refills | Status: DC

## 2019-01-30 MED ORDER — FROVATRIPTAN SUCCINATE 2.5 MG PO TABS: 2.5000 mg | ORAL_TABLET | ORAL | 4 refills | Status: DC | PRN

## 2019-01-30 NOTE — Progress Notes (Signed)
I have read the note, and I agree with the clinical assessment and plan.  [name] [name] K Tylor Courtwright   

## 2019-01-30 NOTE — Progress Notes (Signed)
    Virtual Visit via Video Note  I connected with [name] [name] on 01/30/19 at  1:45 PM EDT by a video enabled telemedicine application and verified that I am speaking with the correct person using two identifiers.   I discussed the limitations of evaluation and management by telemedicine and the availability of in person appointments. The patient expressed understanding and agreed to proceed.  History of Present Illness: 01/30/2019 SS: [name] [name] is a 33 year old female with history of migraine headaches.  She is currently taking Topamax 100 mg at bedtime, nortriptyline 100 mg at bedtime, Ajovy monthly injection, Frova since many of headaches occur during her menstrual cycle.  Her headaches have decreased down to 4 to 5/month.  She used to have 10-11 headaches a month.  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  She did get a one-time Ajovy injection in the office in February 2020, however was never able to get Ajovy from the pharmacy due to insurance issues.  She said during the month she got the injection, her headache management was very good.  She denies any new problems or concerns.  She denies any changes to her medications or medical history.   2/3/2020CM [name] [name], 33 year old female returns for follow-up with a history of migraine headaches.  For the month of January she had known headaches.  Her acute medication was switched to Frova which was beneficial.  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  She is currently on Topamax and Pamelor as preventatives.  She needs more time on her FMLA for excused absences from work.  She returns for reevaluation   Observations/Objective: Alert, answers questions appropriately, speech is clear and concise, facial symmetry noted, symmetric eye movements, no arm drift   Assessment and Plan: 1.  Migraine headaches  Overall her headaches are improved, having 4-5 headaches a month.  She will continue taking nortriptyline 100 mg at bedtime, Topamax 100 mg at bedtime, I will reorder Ajovy.  I am not sure what happened with the prior authorization previously, however we will try again.  If she does not hear something in the next week or so she will call and let us know.  She will continue using Frova for menstrual migraines.  I will send in refills today.   Follow Up Instructions: 4 months   I discussed the assessment and treatment plan with the patient. The patient was provided an opportunity to ask questions and all were answered. The patient agreed with the plan and demonstrated an understanding of the instructions.   The patient was advised to call back or seek an in-person evaluation if the symptoms worsen or if the condition fails to improve as anticipated.  I provided 20 minutes of non-face-to-face time during this encounter.   Otila Kluver, DNP  [facility] [address] [place], [place] [address] [phone]

## 2019-01-31 ENCOUNTER — Telehealth: Payer: Self-pay

## 2019-01-31 NOTE — Telephone Encounter (Signed)
Pending approval for Ajovy 225 mg/1.5 mL Key:[id number] Rx #: [id number] ICD 10 code: G43.019  I will update once a decision has been made.

## 2019-01-31 NOTE — Telephone Encounter (Signed)
Approved from 01/01/2019 through 01/31/2020.   Once I receive the approval letter I will fax it to the pharmacy

## 2019-02-04 ENCOUNTER — Ambulatory Visit: Payer: BLUE CROSS/BLUE SHIELD | Admitting: Neurology

## 2019-02-18 ENCOUNTER — Telehealth: Payer: Self-pay | Admitting: *Deleted

## 2019-02-18 NOTE — Telephone Encounter (Signed)
LMVM for pt to return call to schedule  4 mo RV follow up appointment. When returns call please make appointment.   

## 2019-03-12 NOTE — Telephone Encounter (Signed)
lvm for patient to cb if she would  Like f/u appnt

## 2019-10-08 ENCOUNTER — Ambulatory Visit: Payer: BC Managed Care – PPO | Attending: Internal Medicine

## 2019-10-08 DIAGNOSIS — Z20822 Contact with and (suspected) exposure to covid-19: Secondary | ICD-10-CM

## 2019-10-11 LAB — NOVEL CORONAVIRUS, NAA: SARS-CoV-2, NAA: NOT DETECTED

## 2019-12-14 ENCOUNTER — Ambulatory Visit (HOSPITAL_COMMUNITY)
Admission: EM | Admit: 2019-12-14 | Discharge: 2019-12-14 | Disposition: A | Discharge status: Home / Self Care | Payer: BC Managed Care – PPO | Attending: Physician Assistant | Admitting: Physician Assistant

## 2019-12-14 ENCOUNTER — Encounter (HOSPITAL_COMMUNITY): Payer: Self-pay | Admitting: *Deleted

## 2019-12-14 ENCOUNTER — Other Ambulatory Visit: Payer: Self-pay

## 2019-12-14 DIAGNOSIS — Z20822 Contact with and (suspected) exposure to covid-19: Secondary | ICD-10-CM | POA: Insufficient documentation

## 2019-12-14 DIAGNOSIS — J069 Acute upper respiratory infection, unspecified: Secondary | ICD-10-CM | POA: Diagnosis present

## 2019-12-14 DIAGNOSIS — R05 Cough: Secondary | ICD-10-CM

## 2019-12-14 MED ORDER — BENZONATATE 100 MG PO CAPS: 100.0000 mg | ORAL_CAPSULE | Freq: Three times a day (TID) | ORAL | 0 refills | Status: DC

## 2019-12-14 MED ORDER — AMOXICILLIN-POT CLAVULANATE 875-125 MG PO TABS: 1.0000 | ORAL_TABLET | Freq: Two times a day (BID) | ORAL | 0 refills | Status: AC

## 2019-12-14 MED ORDER — OLOPATADINE HCL 0.1 % OP SOLN: 1.0000 [drp] | Freq: Two times a day (BID) | OPHTHALMIC | 12 refills | Status: DC

## 2019-12-14 MED ORDER — CETIRIZINE HCL 10 MG PO TABS: 10.0000 mg | ORAL_TABLET | Freq: Every day | ORAL | 0 refills | Status: DC

## 2019-12-14 MED ORDER — GUAIFENESIN-DM 100-10 MG/5ML PO SYRP: 10.0000 mL | ORAL_SOLUTION | Freq: Every evening | ORAL | 0 refills | Status: DC | PRN

## 2019-12-14 NOTE — ED Triage Notes (Signed)
Started with runny nose and sneezing approx 7 days ago; 4 days ago started with cough, chest congestion, chest pain with coughing.  Denies fevers.

## 2019-12-14 NOTE — ED Provider Notes (Signed)
[facility]    CSN: [id number] Arrival date & time: 12/14/19  1253      History   Chief Complaint Chief Complaint  Patient presents with  . Cough    HPI [name] [name] is a 34 y.o. female.   Presents with sinus congestion and cough.  She reports symptoms started around 5 days ago with runny nose sneezing and some itchy eyes.  However her nasal congestion is worsened and she has developed a cough.  She has had some clear discharge mixed with yellow.  Reports facial pain and frontal headache.  Denies fever and chills.  Cough has been nonproductive.  It is worse at night.  She reports some chest pain with cough.  Denies shortness of breath.  Denies nausea, vomiting, diarrhea.  Denies change in taste or smell.  She reports her children were sick the week prior to her symptoms starting with upper respiratory symptoms.  They did not get Covid testing at the time.  They have recovered since then.    Reports a history of having bacterial sinus infections.     Past Medical History:  Diagnosis Date  . Allergy   . Common migraine with intractable migraine 03/16/2018  . Depression   . Gonorrhea   . Headache(784.0)   . Infection    UTI  . Shoulder dystocia, delivered, current hospitalization 06/14/2014  . Vaginal Pap smear, abnormal    f/u ok    Patient Active Problem List   Diagnosis Date Noted  . Common migraine without intractability 10/04/2018  . Common migraine with intractable migraine 03/16/2018  . Hx of tubal ligation 10/11/2017  . 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 S/P primary low transverse C-section 07/20/2016  . S/P cesarean section 07/20/2016  . History of miscarriage 07/20/2013    Past Surgical History:  Procedure Laterality Date  . CESAREAN SECTION WITH BILATERAL TUBAL LIGATION Bilateral 07/20/2016   Procedure: REPEAT CESAREAN SECTION WITH BILATERAL TUBAL LIGATION;  Surgeon: Jaymes Graff, MD;  Location:  [facility];  Service: Obstetrics;  Laterality: Bilateral;  Provider requests RNFA -ap    OB History    Gravida  4   Para  2   Term  2   Preterm      AB  2   Living  2     SAB  2   TAB      Ectopic      Multiple  0   Live Births  2            Home Medications    Prior to Admission medications   Medication Sig Start Date End Date Taking? Authorizing Provider  Fremanezumab-vfrm (AJOVY) 225 MG/1.5ML SOSY Inject 225 mg into the skin every 30 (thirty) days. 01/30/19  Yes Glean Salvo, NP  frovatriptan (FROVA) 2.5 MG tablet Take 1 tablet (2.5 mg total) by mouth as needed for migraine. If recurs, may repeat after 2 hours. Max of 2 tabs in 24 hours. 01/30/19  Yes Glean Salvo, NP  ipratropium (ATROVENT) 0.03 % nasal spray Place 2 sprays into both nostrils 2 (two) times daily. 11/27/18  Yes Peyton Najjar, MD  nortriptyline (PAMELOR) 50 MG capsule Take 2 capsules (100 mg total) by mouth at bedtime. 01/30/19  Yes Glean Salvo, NP  topiramate (TOPAMAX) 100 MG tablet Take 1 tablet (100 mg total) by mouth daily. 01/30/19  Yes Glean Salvo, NP  amoxicillin-clavulanate (AUGMENTIN) 875-125 MG tablet  Take 1 tablet by mouth 2 (two) times daily for 7 days. 12/17/19 12/24/19  Bettyjo Lundblad, Veryl Speak, PA-C  benzonatate (TESSALON) 100 MG capsule Take 1 capsule (100 mg total) by mouth every 8 (eight) hours. 12/14/19   Hurley Blevins, Veryl Speak, PA-C  cetirizine (ZYRTEC ALLERGY) 10 MG tablet Take 1 tablet (10 mg total) by mouth daily. 12/14/19 01/13/20  Elnora Quizon, Veryl Speak, PA-C  fluticasone (FLONASE) 50 MCG/ACT nasal spray Place 2 sprays into both nostrils daily. 09/01/17   Domenick Gong, MD  guaiFENesin-dextromethorphan (ROBITUSSIN DM) 100-10 MG/5ML syrup Take 10 mLs by mouth at bedtime as needed for cough. 12/14/19   Tashunda Vandezande, Veryl Speak, PA-C  olopatadine (PATADAY) 0.1 % ophthalmic solution Place 1 drop into both eyes 2 (two) times daily. 12/14/19   Arthuro Canelo, Veryl Speak, PA-C    Family History Family History  Problem  Relation Age of Onset  . Healthy Mother   . Hearing loss Neg Hx     Social History Social History   Tobacco Use  . Smoking status: Former Games developer  . Smokeless tobacco: Never Used  Substance Use Topics  . Alcohol use: Yes    Comment: occasionally  . Drug use: Yes    Types: Marijuana    Comment: occasionally     Allergies   Pollen extract   Review of Systems Review of Systems  Constitutional: Negative for chills and fever.  HENT: Positive for congestion, rhinorrhea, sinus pressure and sinus pain. Negative for ear pain, postnasal drip and sore throat.   Eyes: Negative for pain and visual disturbance.  Respiratory: Positive for cough. Negative for shortness of breath.   Cardiovascular: Negative for chest pain and palpitations.  Gastrointestinal: Negative for abdominal pain, diarrhea, nausea and vomiting.  Genitourinary: Negative for dysuria and hematuria.  Musculoskeletal: Negative for arthralgias, back pain and myalgias.  Skin: Negative for color change and rash.  Neurological: Negative for seizures, syncope and headaches.  All other systems reviewed and are negative.    Physical Exam Triage Vital Signs ED Triage Vitals  Enc Vitals Group     BP      Pulse      Resp      Temp      Temp src      SpO2      Weight      Height      Head Circumference      Peak Flow      Pain Score      Pain Loc      Pain Edu?      Excl. in GC?    No data found.  Updated Vital Signs BP 133/87   Pulse 89   Temp 98.6 F (37 C) (Oral)   Resp 20   LMP 11/15/2019 (Exact Date)   SpO2 99%   Visual Acuity Right Eye Distance:   Left Eye Distance:   Bilateral Distance:    Right Eye Near:   Left Eye Near:    Bilateral Near:     Physical Exam Vitals and nursing note reviewed.  Constitutional:      General: She is not in acute distress.    Appearance: Normal appearance. She is well-developed. She is not ill-appearing.  HENT:     Head: Normocephalic and atraumatic.      Comments: No tenderness to palpation of maxillary sinuses    Right Ear: Tympanic membrane normal.     Left Ear: Tympanic membrane normal.     Nose:     Comments:  Bilateral erythematous and swollen turbinates with clear with yellow nasal discharge    Mouth/Throat:     Mouth: Mucous membranes are moist.     Comments: Mild postnasal drip Eyes:     Conjunctiva/sclera: Conjunctivae normal.     Pupils: Pupils are equal, round, and reactive to light.  Cardiovascular:     Rate and Rhythm: Normal rate and regular rhythm.     Heart sounds: No murmur.  Pulmonary:     Effort: Pulmonary effort is normal. No respiratory distress.     Breath sounds: Normal breath sounds.  Abdominal:     Palpations: Abdomen is soft.     Tenderness: There is no abdominal tenderness.  Musculoskeletal:     Cervical back: Neck supple.  Skin:    General: Skin is warm and dry.  Neurological:     General: No focal deficit present.     Mental Status: She is alert and oriented to person, place, and time.      UC Treatments / Results  Labs (all labs ordered are listed, but only abnormal results are displayed) Labs Reviewed  SARS CORONAVIRUS 2 (TAT 6-24 HRS)    EKG   Radiology No results found.  Procedures Procedures (including critical care time)  Medications Ordered in UC Medications - No data to display  Initial Impression / Assessment and Plan / UC Course  I have reviewed the triage vital signs and the nursing notes.  Pertinent labs & imaging results that were available during my care of the patient were reviewed by me and considered in my medical decision making (see chart for details).     #Viral URI with cough Patient is a 34 year old female presenting with 5 to 6 days of upper respiratory symptoms.  She has had worsening nasal congestion with development of facial pain.  We discussed watchful waiting for 2-3 more days to see if improvement with a delayed prescription for Augmentin.  Patient  agrees to wait to see if improvement in her nasal and sinus symptoms.  Otherwise symptomatic care was continue to be recommended.  Covid PCR was also sent.  Final Clinical Impressions(s) / UC Diagnoses   Final diagnoses:  Viral URI with cough  Encounter for laboratory testing for COVID-19 virus     Discharge Instructions     Take the cough medicines as prescribed  Continue flonase and take the zyrtec daily, use the eye drops  If your sinus symptoms are not improving or worsening in 2-3 days fill the augmentin and  take for 7 days. If not improving after this treatment please return or follow up with primary care  If your Covid-19 test is positive, you will receive a phone call from [facility] regarding your results. Negative test results are not called. Both positive and negative results area always visible on MyChart. If you do not have a MyChart account, sign up instructions are in your discharge papers.   Persons who are directed to care for themselves at home may discontinue isolation under the following conditions:  . At least 10 days have passed since symptom onset and . At least 24 hours have passed without running a fever (this means without the use of fever-reducing medications) and . Other symptoms have improved.  Persons infected with COVID-19 who never develop symptoms may discontinue isolation and other precautions 10 days after the date of their first positive COVID-19 test.     ED Prescriptions    Medication Sig Dispense Auth. Provider   cetirizine (  ZYRTEC ALLERGY) 10 MG tablet Take 1 tablet (10 mg total) by mouth daily. 30 tablet Marteze Vecchio, Veryl Speak, PA-C   amoxicillin-clavulanate (AUGMENTIN) 875-125 MG tablet Take 1 tablet by mouth 2 (two) times daily for 7 days. 14 tablet Khiem Gargis, Veryl Speak, PA-C   benzonatate (TESSALON) 100 MG capsule Take 1 capsule (100 mg total) by mouth every 8 (eight) hours. 21 capsule Darald Uzzle, Veryl Speak, PA-C   guaiFENesin-dextromethorphan (ROBITUSSIN  DM) 100-10 MG/5ML syrup Take 10 mLs by mouth at bedtime as needed for cough. 118 mL Pluma Diniz, Veryl Speak, PA-C   olopatadine (PATADAY) 0.1 % ophthalmic solution Place 1 drop into both eyes 2 (two) times daily. 5 mL Neno Hohensee, Veryl Speak, PA-C     PDMP not reviewed this encounter.   Hermelinda Medicus, PA-C 12/14/19 1728

## 2019-12-14 NOTE — Discharge Instructions (Addendum)
Take the cough medicines as prescribed  Continue flonase and take the zyrtec daily, use the eye drops  If your sinus symptoms are not improving or worsening in 2-3 days fill the augmentin and  take for 7 days. If not improving after this treatment please return or follow up with primary care  If your Covid-19 test is positive, you will receive a phone call from [facility] regarding your results. Negative test results are not called. Both positive and negative results area always visible on MyChart. If you do not have a MyChart account, sign up instructions are in your discharge papers.   Persons who are directed to care for themselves at home may discontinue isolation under the following conditions:   At least 10 days have passed since symptom onset and  At least 24 hours have passed without running a fever (this means without the use of fever-reducing medications) and  Other symptoms have improved.  Persons infected with COVID-19 who never develop symptoms may discontinue isolation and other precautions 10 days after the date of their first positive COVID-19 test.

## 2019-12-15 LAB — SARS CORONAVIRUS 2 (TAT 6-24 HRS): SARS Coronavirus 2: NEGATIVE

## 2020-01-15 ENCOUNTER — Encounter: Payer: Self-pay | Admitting: Family Medicine

## 2020-01-15 NOTE — Telephone Encounter (Signed)
Patient would like a letter for her leave of absence from work. Also states she hate you are leaving the Practice. Please Advise.

## 2020-02-03 ENCOUNTER — Telehealth: Payer: Self-pay

## 2020-02-03 NOTE — Telephone Encounter (Signed)
Received request to refill pt's ajovy from [facility]. Pt is overdue for an appt. I called pt to discuss, no answer, left a message asking her to call me back. If pt calls back please discuss this with her and let a clinical staff member know when she is scheduled so that we may refill her ajovy.

## 2020-03-01 ENCOUNTER — Emergency Department (HOSPITAL_COMMUNITY)
Admission: EM | Admit: 2020-03-01 | Discharge: 2020-03-01 | Disposition: A | Discharge status: Home / Self Care | Payer: BC Managed Care – PPO | Attending: Emergency Medicine | Admitting: Emergency Medicine

## 2020-03-01 ENCOUNTER — Emergency Department (HOSPITAL_COMMUNITY): Payer: BC Managed Care – PPO

## 2020-03-01 ENCOUNTER — Other Ambulatory Visit: Payer: Self-pay

## 2020-03-01 ENCOUNTER — Encounter (HOSPITAL_COMMUNITY): Payer: Self-pay

## 2020-03-01 DIAGNOSIS — G43909 Migraine, unspecified, not intractable, without status migrainosus: Secondary | ICD-10-CM | POA: Insufficient documentation

## 2020-03-01 DIAGNOSIS — Z79899 Other long term (current) drug therapy: Secondary | ICD-10-CM | POA: Diagnosis not present

## 2020-03-01 DIAGNOSIS — Z20822 Contact with and (suspected) exposure to covid-19: Secondary | ICD-10-CM | POA: Diagnosis not present

## 2020-03-01 DIAGNOSIS — J069 Acute upper respiratory infection, unspecified: Secondary | ICD-10-CM | POA: Diagnosis not present

## 2020-03-01 DIAGNOSIS — R0602 Shortness of breath: Secondary | ICD-10-CM | POA: Diagnosis present

## 2020-03-01 DIAGNOSIS — Z87891 Personal history of nicotine dependence: Secondary | ICD-10-CM | POA: Insufficient documentation

## 2020-03-01 LAB — COMPREHENSIVE METABOLIC PANEL
ALT: 25 U/L (ref 0–44)
AST: 22 U/L (ref 15–41)
Albumin: 4.4 g/dL (ref 3.5–5.0)
Alkaline Phosphatase: 47 U/L (ref 38–126)
Anion gap: 10 (ref 5–15)
BUN: 12 mg/dL (ref 6–20)
CO2: 23 mmol/L (ref 22–32)
Calcium: 10.2 mg/dL (ref 8.9–10.3)
Chloride: 108 mmol/L (ref 98–111)
Creatinine, Ser: 0.74 mg/dL (ref 0.44–1.00)
GFR calc Af Amer: >60 mL/min (ref >60)
GFR calc non Af Amer: >60 mL/min (ref >60)
Glucose, Bld: 96 mg/dL (ref 70–99)
Potassium: 3.7 mmol/L (ref 3.5–5.1)
Sodium: 141 mmol/L (ref 135–145)
Total Bilirubin: 1 mg/dL (ref 0.3–1.2)
Total Protein: 8.3 g/dL — ABNORMAL HIGH (ref 6.5–8.1)

## 2020-03-01 LAB — CBC WITH DIFFERENTIAL/PLATELET
Abs Immature Granulocytes: 0.01 10*3/uL (ref 0.00–0.07)
Basophils Absolute: 0.1 10*3/uL (ref 0.0–0.1)
Basophils Relative: 1 %
Eosinophils Absolute: 0.4 10*3/uL (ref 0.0–0.5)
Eosinophils Relative: 6 %
HCT: 43.5 % (ref 36.0–46.0)
Hemoglobin: 14.9 g/dL (ref 12.0–15.0)
Immature Granulocytes: 0 %
Lymphocytes Relative: 36 %
Lymphs Abs: 2.2 10*3/uL (ref 0.7–4.0)
MCH: 29.6 pg (ref 26.0–34.0)
MCHC: 34.3 g/dL (ref 30.0–36.0)
MCV: 86.3 fL (ref 80.0–100.0)
Monocytes Absolute: 0.5 10*3/uL (ref 0.1–1.0)
Monocytes Relative: 9 %
Neutro Abs: 3 10*3/uL (ref 1.7–7.7)
Neutrophils Relative %: 48 %
Platelets: 206 10*3/uL (ref 150–400)
RBC: 5.04 MIL/uL (ref 3.87–5.11)
RDW: 11.9 % (ref 11.5–15.5)
WBC: 6.2 10*3/uL (ref 4.0–10.5)
nRBC: 0 % (ref 0.0–0.2)

## 2020-03-01 LAB — SARS CORONAVIRUS 2 BY RT PCR (HOSPITAL ORDER, PERFORMED IN ~~LOC~~ HOSPITAL LAB): SARS Coronavirus 2: NEGATIVE

## 2020-03-01 LAB — FIBRINOGEN: Fibrinogen: 409 mg/dL (ref 210–475)

## 2020-03-01 LAB — C-REACTIVE PROTEIN: CRP: 0.5 mg/dL (ref <1.0)

## 2020-03-01 LAB — TRIGLYCERIDES: Triglycerides: 127 mg/dL (ref <150)

## 2020-03-01 LAB — I-STAT BETA HCG BLOOD, ED (MC, WL, AP ONLY): I-stat hCG, quantitative: <5 m[IU]/mL (ref <5)

## 2020-03-01 LAB — D-DIMER, QUANTITATIVE: D-Dimer, Quant: 0.55 ug/mL-FEU — ABNORMAL HIGH (ref 0.00–0.50)

## 2020-03-01 LAB — LACTATE DEHYDROGENASE: LDH: 104 U/L (ref 98–192)

## 2020-03-01 LAB — PROCALCITONIN: Procalcitonin: <0.1 ng/mL

## 2020-03-01 LAB — FERRITIN: Ferritin: 69 ng/mL (ref 11–307)

## 2020-03-01 MED ORDER — METOCLOPRAMIDE HCL 5 MG/ML IJ SOLN
10.0000 mg | Freq: Once | INTRAMUSCULAR | Status: AC
  Administered 2020-03-01: 10 mg via INTRAVENOUS
  Filled 2020-03-01: qty 2

## 2020-03-01 MED ORDER — MORPHINE SULFATE (PF) 4 MG/ML IV SOLN
4.0000 mg | Freq: Once | INTRAVENOUS | Status: AC
  Administered 2020-03-01: 4 mg via INTRAVENOUS
  Filled 2020-03-01: qty 1

## 2020-03-01 MED ORDER — ALBUTEROL (5 MG/ML) CONTINUOUS INHALATION SOLN
10.0000 mg/h | INHALATION_SOLUTION | Freq: Once | RESPIRATORY_TRACT | Status: AC
  Administered 2020-03-01: 10 mg/h via RESPIRATORY_TRACT
  Filled 2020-03-01: qty 20

## 2020-03-01 MED ORDER — IPRATROPIUM-ALBUTEROL 0.5-2.5 (3) MG/3ML IN SOLN: 3.0000 mL | Freq: Once | RESPIRATORY_TRACT | Status: DC

## 2020-03-01 MED ORDER — KETOROLAC TROMETHAMINE 30 MG/ML IJ SOLN
15.0000 mg | Freq: Once | INTRAMUSCULAR | Status: AC
  Administered 2020-03-01: 15 mg via INTRAVENOUS
  Filled 2020-03-01: qty 1

## 2020-03-01 MED ORDER — CETIRIZINE HCL 10 MG PO TABS
10.0000 mg | ORAL_TABLET | Freq: Every day | ORAL | 0 refills | Status: DC
Start: 2020-03-01 — End: 2020-09-07

## 2020-03-01 MED ORDER — IOHEXOL 350 MG/ML SOLN
100.0000 mL | Freq: Once | INTRAVENOUS | Status: AC | PRN
  Administered 2020-03-01: 100 mL via INTRAVENOUS

## 2020-03-01 MED ORDER — BENZONATATE 100 MG PO CAPS
200.0000 mg | ORAL_CAPSULE | Freq: Once | ORAL | Status: AC
  Administered 2020-03-01: 200 mg via ORAL
  Filled 2020-03-01: qty 2

## 2020-03-01 MED ORDER — DEXAMETHASONE SODIUM PHOSPHATE 10 MG/ML IJ SOLN
10.0000 mg | Freq: Once | INTRAMUSCULAR | Status: AC
  Administered 2020-03-01: 10 mg via INTRAVENOUS
  Filled 2020-03-01: qty 1

## 2020-03-01 MED ORDER — SODIUM CHLORIDE (PF) 0.9 % IJ SOLN
INTRAMUSCULAR | Status: AC
  Filled 2020-03-01: qty 50

## 2020-03-01 MED ORDER — ALBUTEROL SULFATE (2.5 MG/3ML) 0.083% IN NEBU: 5.0000 mg | INHALATION_SOLUTION | RESPIRATORY_TRACT | Status: DC

## 2020-03-01 MED ORDER — ALBUTEROL SULFATE HFA 108 (90 BASE) MCG/ACT IN AERS
6.0000 | INHALATION_SPRAY | RESPIRATORY_TRACT | Status: AC
  Administered 2020-03-01 (×3): 6 via RESPIRATORY_TRACT
  Filled 2020-03-01: qty 6.7

## 2020-03-01 MED ORDER — PREDNISONE 10 MG PO TABS
50.0000 mg | ORAL_TABLET | Freq: Every day | ORAL | 0 refills | Status: DC
Start: 2020-03-01 — End: 2020-07-01

## 2020-03-01 MED ORDER — ALBUTEROL SULFATE HFA 108 (90 BASE) MCG/ACT IN AERS: 1.0000 | INHALATION_SPRAY | Freq: Four times a day (QID) | RESPIRATORY_TRACT | 0 refills | Status: DC | PRN

## 2020-03-01 NOTE — ED Triage Notes (Signed)
Patient presented to ED with c/o congestion, cough, and sob. Patient report symptoms staring on wed and shortness of breath started today.

## 2020-03-01 NOTE — ED Notes (Signed)
Pt given water 

## 2020-03-01 NOTE — Discharge Instructions (Addendum)
He was seen in the ER for respiratory complaints and headaches.  Headaches are because of a migraine, continue expectant care.  We did extensive work-up on the upper respiratory symptoms you are having.  You have tested negative for COVID-19 and the CT scan does not reveal any bacterial pneumonia or blood clots.  You were wheezing when we had assessed you and we suspect that you are having viral syndrome at this time.  There is a possibility of allergies as well as the underlying cause.  Please take the medications prescribed and follow-up with your primary care doctor in 3 to 5 days.  Return to the ER if your symptoms get worse.

## 2020-03-01 NOTE — ED Provider Notes (Signed)
[facility]-EMERGENCY DEPT Provider Note   CSN: [id number] Arrival date & time: 03/01/20  1324     History No chief complaint on file.   [name] [name] is a 34 y.o. female.  HPI     34 year old female comes in a chief complaint of headache and shortness of breath. Patient reports that she started feeling unwell few days back.  She is having congestion, cough and shortness of breath.  She suspects that her symptoms are because of allergies.  She also was around her child who had fever last week.  She has not received a COVID-19 vaccine yet.  Patient is having diarrhea but no nausea, vomiting, abdominal pain, rash.  Additionally patient is also complaining of headaches.  She has no history of migraines and the current pain is similar to her migraine type headaches.  Past Medical History:  Diagnosis Date  . Allergy   . Common migraine with intractable migraine 03/16/2018  . Depression   . Gonorrhea   . Headache(784.0)   . Infection    UTI  . Shoulder dystocia, delivered, current hospitalization 06/14/2014  . Vaginal Pap smear, abnormal    f/u ok    Patient Active Problem List   Diagnosis Date Noted  . Common migraine without intractability 10/04/2018  . Common migraine with intractable migraine 03/16/2018  . Hx of tubal ligation 10/11/2017  . 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 S/P primary low transverse C-section 07/20/2016  . S/P cesarean section 07/20/2016  . History of miscarriage 07/20/2013    Past Surgical History:  Procedure Laterality Date  . CESAREAN SECTION WITH BILATERAL TUBAL LIGATION Bilateral 07/20/2016   Procedure: REPEAT CESAREAN SECTION WITH BILATERAL TUBAL LIGATION;  Surgeon: Jaymes Graff, MD;  Location: [facility];  Service: Obstetrics;  Laterality: Bilateral;  Provider requests RNFA -ap     OB History    Gravida  4   Para  2   Term  2   Preterm      AB  2   Living  2      SAB  2   TAB      Ectopic      Multiple  0   Live Births  2           Family History  Problem Relation Age of Onset  . Healthy Mother   . Hearing loss Neg Hx     Social History   Tobacco Use  . Smoking status: Former Games developer  . Smokeless tobacco: Never Used  Substance Use Topics  . Alcohol use: Yes    Comment: occasionally  . Drug use: Yes    Types: Marijuana    Comment: occasionally    Home Medications Prior to Admission medications   Medication Sig Start Date End Date Taking? Authorizing Provider  dextromethorphan-guaiFENesin (MUCINEX DM) 30-600 MG 12hr tablet Take 1 tablet by mouth 2 (two) times daily as needed for cough.   Yes [provider]  DM-Doxylamine-Acetaminophen (NYQUIL COLD & FLU PO) Take 1 tablet by mouth 2 (two) times daily as needed (cold symptoms).   Yes [provider]  guaiFENesin-dextromethorphan (ROBITUSSIN DM) 100-10 MG/5ML syrup Take 10 mLs by mouth at bedtime as needed for cough. Patient taking differently: Take 10 mLs by mouth 2 (two) times daily as needed for cough.  12/14/19  Yes Darr, Veryl Speak, PA-C  Pseudoephedrine-APAP-DM (DAYQUIL PO) Take 1 tablet by mouth 2 (two) times daily as needed (cold  symptoms).   Yes [provider]  albuterol (VENTOLIN HFA) 108 (90 Base) MCG/ACT inhaler Inhale 1-2 puffs into the lungs every 6 (six) hours as needed for wheezing or shortness of breath. 03/01/20   Derwood Kaplan, MD  benzonatate (TESSALON) 100 MG capsule Take 1 capsule (100 mg total) by mouth every 8 (eight) hours. Patient not taking: Reported on 03/01/2020 12/14/19   Darr, Veryl Speak, PA-C  cetirizine (ZYRTEC ALLERGY) 10 MG tablet Take 1 tablet (10 mg total) by mouth daily. 03/01/20   Derwood Kaplan, MD  fluticasone (FLONASE) 50 MCG/ACT nasal spray Place 2 sprays into both nostrils daily. Patient not taking: Reported on 03/01/2020 09/01/17   Domenick Gong, MD  Fremanezumab-vfrm (AJOVY) 225 MG/1.5ML SOSY Inject 225 mg  into the skin every 30 (thirty) days. Patient not taking: Reported on 03/01/2020 01/30/19   Glean Salvo, NP  frovatriptan (FROVA) 2.5 MG tablet Take 1 tablet (2.5 mg total) by mouth as needed for migraine. If recurs, may repeat after 2 hours. Max of 2 tabs in 24 hours. Patient not taking: Reported on 03/01/2020 01/30/19   Glean Salvo, NP  ipratropium (ATROVENT) 0.03 % nasal spray Place 2 sprays into both nostrils 2 (two) times daily. Patient not taking: Reported on 03/01/2020 11/27/18   Peyton Najjar, MD  nortriptyline (PAMELOR) 50 MG capsule Take 2 capsules (100 mg total) by mouth at bedtime. Patient not taking: Reported on 03/01/2020 01/30/19   Glean Salvo, NP  olopatadine (PATADAY) 0.1 % ophthalmic solution Place 1 drop into both eyes 2 (two) times daily. Patient not taking: Reported on 03/01/2020 12/14/19   Darr, Veryl Speak, PA-C  predniSONE (DELTASONE) 10 MG tablet Take 5 tablets (50 mg total) by mouth daily. 03/01/20   Derwood Kaplan, MD  topiramate (TOPAMAX) 100 MG tablet Take 1 tablet (100 mg total) by mouth daily. Patient not taking: Reported on 03/01/2020 01/30/19   Glean Salvo, NP    Allergies    Pollen extract  Review of Systems   Review of Systems  Constitutional: Positive for activity change.  Respiratory: Positive for cough, shortness of breath and wheezing.   Cardiovascular: Negative for chest pain.  Gastrointestinal: Positive for diarrhea. Negative for nausea and vomiting.  Allergic/Immunologic: Negative for immunocompromised state.  Neurological: Positive for headaches.  All other systems reviewed and are negative.   Physical Exam Updated Vital Signs BP 98/62   Pulse 99   Temp 98.1 F (36.7 C) (Oral)   Resp (!) 21   Ht 5\' 1"  (1.549 m)   Wt 65.8 kg   LMP  (LMP Unknown)   SpO2 94%   BMI 27.40 kg/m   Physical Exam Constitutional:      Appearance: She is well-developed.  HENT:     Head: Normocephalic and atraumatic.  Eyes:     Pupils: Pupils are equal,  round, and reactive to light.  Cardiovascular:     Rate and Rhythm: Normal rate and regular rhythm.     Heart sounds: Normal heart sounds.  Pulmonary:     Effort: Pulmonary effort is normal. No respiratory distress.     Breath sounds: Wheezing present.  Abdominal:     General: There is no distension.     Palpations: Abdomen is soft.     Tenderness: There is no abdominal tenderness. There is no guarding or rebound.  Musculoskeletal:     Cervical back: Neck supple.  Skin:    General: Skin is warm and dry.  Neurological:  Mental Status: She is alert and oriented to person, place, and time.     ED Results / Procedures / Treatments   Labs (all labs ordered are listed, but only abnormal results are displayed) Labs Reviewed  COMPREHENSIVE METABOLIC PANEL - Abnormal; Notable for the following components:      Result Value   Total Protein 8.3 (*)    All other components within normal limits  D-DIMER, QUANTITATIVE (NOT AT [facility]) - Abnormal; Notable for the following components:   D-Dimer, Quant 0.55 (*)    All other components within normal limits  SARS CORONAVIRUS 2 BY RT PCR (HOSPITAL ORDER, PERFORMED IN [place] [facility] [facility])  CBC WITH DIFFERENTIAL/PLATELET  PROCALCITONIN  LACTATE DEHYDROGENASE  FERRITIN  TRIGLYCERIDES  FIBRINOGEN  C-REACTIVE PROTEIN  I-STAT BETA HCG BLOOD, ED ([facility], [facility], [facility] ONLY)    EKG EKG Interpretation  Date/Time:  Sunday Mar 01 2020 14:14:57 EDT Ventricular Rate:  84 PR Interval:    QRS Duration: 91 QT Interval:  380 QTC Calculation: 450 R Axis:   69 Text Interpretation: Sinus rhythm Low voltage, precordial leads No acute changes No significant change since last tracing Confirmed by Derwood KaplanNanavati, Sevrin Sally [id number]) on 03/01/2020 3:24:56 PM   Radiology CT Angio Chest PE W and/or Wo Contrast  Result Date: 03/01/2020 CLINICAL DATA:  Shortness of breath and congestion EXAM: CT ANGIOGRAPHY CHEST WITH CONTRAST TECHNIQUE: Multidetector CT imaging of the chest  was performed using the standard protocol during bolus administration of intravenous contrast. Multiplanar CT image reconstructions and MIPs were obtained to evaluate the vascular anatomy. CONTRAST:  100mL OMNIPAQUE IOHEXOL 350 MG/ML SOLN COMPARISON:  Chest x-ray from earlier in the same day. FINDINGS: Cardiovascular: Thoracic aorta and its branches are within normal limits. No aneurysmal dilatation or dissection is seen. No cardiac enlargement is noted. No significant coronary calcifications are noted. The pulmonary artery shows a normal branching pattern. No filling defects are identified to suggest pulmonary embolism. Mediastinum/Nodes: Thoracic inlet is within normal limits. No hilar or mediastinal adenopathy is seen. The esophagus is within normal limits. Lungs/Pleura: Lungs are well aerated bilaterally. No focal infiltrate is seen. Very small right-sided pleural effusion is noted with mild atelectatic changes. Upper Abdomen: Visualized upper abdomen demonstrates fatty infiltration of the liver. Musculoskeletal: No chest wall abnormality. No acute or significant osseous findings. Review of the MIP images confirms the above findings. IMPRESSION: No evidence of pulmonary embolism. Mild right basilar atelectasis and small effusion. No other focal abnormality is noted. Electronically Signed   By: Alcide CleverMark  Lukens M.D.   On: 03/01/2020 21:28   DG Chest Portable 1 View  Result Date: 03/01/2020 CLINICAL DATA:  Patient presented to ED with c/o congestion and cough x 4 days ago and sob onset today. Former smoker. EXAM: PORTABLE CHEST 1 VIEW COMPARISON:  Chest radiograph 01/06/2018 FINDINGS: The heart size and mediastinal contours are within normal limits. Both lungs are clear. No pneumothorax or significant pleural effusion. The visualized skeletal structures are unremarkable. IMPRESSION: No acute cardiopulmonary finding. Electronically Signed   By: Emmaline KluverNancy  Ballantyne M.D.   On: 03/01/2020 14:14     Procedures Procedures (including critical care time)  Medications Ordered in ED Medications  ipratropium-albuterol (DUONEB) 0.5-2.5 (3) MG/3ML nebulizer solution 3 mL (3 mLs Nebulization Not Given 03/01/20 1717)  sodium chloride (PF) 0.9 % injection (has no administration in time range)  albuterol (VENTOLIN HFA) 108 (90 Base) MCG/ACT inhaler 6 puff (6 puffs Inhalation Given 03/01/20 1543)  benzonatate (TESSALON) capsule 200 mg (200 mg Oral  Given 03/01/20 1644)  albuterol (PROVENTIL,VENTOLIN) solution continuous neb (10 mg/hr Nebulization Given 03/01/20 1739)  ketorolac (TORADOL) 30 MG/ML injection 15 mg (15 mg Intravenous Given 03/01/20 1903)  metoCLOPramide (REGLAN) injection 10 mg (10 mg Intravenous Given 03/01/20 1903)  dexamethasone (DECADRON) injection 10 mg (10 mg Intravenous Given 03/01/20 1903)  morphine 4 MG/ML injection 4 mg (4 mg Intravenous Given 03/01/20 2022)  iohexol (OMNIPAQUE) 350 MG/ML injection 100 mL (100 mLs Intravenous Contrast Given 03/01/20 2105)    ED Course  I have reviewed the triage vital signs and the nursing notes.  Pertinent labs & imaging results that were available during my care of the patient were reviewed by me and considered in my medical decision making (see chart for details).    MDM Rules/Calculators/A&P                      Lawanna Kobus was evaluated in Emergency Department on 03/01/2020 for the symptoms described in the history of present illness. She was evaluated in the context of the global COVID-19 pandemic, which necessitated consideration that the patient might be at risk for infection with the SARS-CoV-2 virus that causes COVID-19. Institutional protocols and algorithms that pertain to the evaluation of patients at risk for COVID-19 are in a state of rapid change based on information released by regulatory bodies including the CDC and federal and state organizations. These policies and algorithms were followed during the patient's care in the  ED.   34 year old female was seen with chief complaint of shortness of breath, headaches.  She has history of migraines and the headache is similar to it.  The migraines are likely provoked by her current acute illness which is respiratory in nature.  She is having essentially URI-like symptoms with shortness of breath and cough.  Patient did have sick exposure, her son, who had come down with fever.  She does not have any fevers now.  She is noted to be tachycardic and is wheezing all over.  Differential diagnosis includes PE, COVID-19 pneumonia, bacterial pneumonia, pulmonary edema.  Appropriate work-up initiated.   Reassessment: Patient's initial lab work and her x-rays and Covid test are negative. We will give patient hour-long breathing treatment and reassess.  D-dimer is marginally elevated, will consider CT PE only if the patient is feeling persistently short of breath after the nebulizer treatment.   Reassessment: Patient continues to have pleuritic chest pain and she is still tachycardic.  I discussed with her that my suspicion for PE is low, however given that her symptoms are persistent, we can proceed with CT scan versus be conservative and have her follow-up with her PCP.  She can return to the ER if her symptoms are getting worse.  She prefers getting aggressive diagnostic work-up here and to get the CT scan.  Risk versus benefit discussed.  CT scan results are back and there is no PE or bacterial pneumonia.  We will treat patient with albuterol, steroids and have her follow-up with her PCP.  Final Clinical Impression(s) / ED Diagnoses Final diagnoses:  Upper respiratory infection, viral  Migraine without status migrainosus, not intractable, unspecified migraine type    Rx / DC Orders ED Discharge Orders         Ordered    predniSONE (DELTASONE) 10 MG tablet  Daily     03/01/20 2139    cetirizine (ZYRTEC ALLERGY) 10 MG tablet  Daily     03/01/20 2139    albuterol (VENTOLIN  HFA) 108 (90 Base) MCG/ACT inhaler  Every 6 hours PRN     03/01/20 2139           Derwood Kaplan, MD 03/01/20 2142

## 2020-03-06 ENCOUNTER — Other Ambulatory Visit: Payer: Self-pay

## 2020-03-06 ENCOUNTER — Encounter: Payer: Self-pay | Admitting: Registered Nurse

## 2020-03-06 ENCOUNTER — Ambulatory Visit: Payer: BC Managed Care – PPO | Admitting: Registered Nurse

## 2020-03-06 VITALS — BP 106/71 | HR 75 | Temp 98.1°F | Resp 17 | Ht 61.0 in | Wt 157.2 lb

## 2020-03-06 DIAGNOSIS — J22 Unspecified acute lower respiratory infection: Secondary | ICD-10-CM | POA: Diagnosis not present

## 2020-03-06 MED ORDER — BENZONATATE 200 MG PO CAPS: 200.0000 mg | ORAL_CAPSULE | Freq: Two times a day (BID) | ORAL | 0 refills | Status: DC | PRN

## 2020-03-06 MED ORDER — AZITHROMYCIN 250 MG PO TABS: ORAL_TABLET | ORAL | 0 refills | Status: DC

## 2020-03-06 NOTE — Patient Instructions (Signed)
° ° ° °  If you have lab work done today you will be contacted with your lab results within the next 2 weeks.  If you have not heard from us then please contact us. The fastest way to get your results is to register for My Chart. ° ° °IF you received an x-ray today, you will receive an invoice from [facility]. Please contact [facility] at [phone] with questions or concerns regarding your invoice.  ° °IF you received labwork today, you will receive an invoice from [facility]. Please contact [facility] at [phone] with questions or concerns regarding your invoice.  ° °Our billing staff will not be able to assist you with questions regarding bills from these companies. ° °You will be contacted with the lab results as soon as they are available. The fastest way to get your results is to activate your My Chart account. Instructions are located on the last page of this paperwork. If you have not heard from us regarding the results in 2 weeks, please contact this office. °  ° ° ° °

## 2020-03-23 ENCOUNTER — Telehealth: Payer: Self-pay | Admitting: Registered Nurse

## 2020-03-23 NOTE — Telephone Encounter (Signed)
Patient was seen by [name] on 03/06/2020 , on this visit patient had off FMLA paperwork and is wanting to make sure it was completed . Pt is wanting to pick up today and is wanting to return to work    Please follow up with patient [phone]

## 2020-03-23 NOTE — Telephone Encounter (Signed)
Spoke with patient she stated that she gave FMLA paperwork to you and it was supposed to be uploaded in her chart , and copy for pickup do you know where I would find this paperwork?

## 2020-03-25 NOTE — Telephone Encounter (Signed)
Paperwork has been completed and placed with Nehemiah Settle, CMA.   [name], NP

## 2020-05-12 ENCOUNTER — Other Ambulatory Visit: Payer: BC Managed Care – PPO

## 2020-05-12 ENCOUNTER — Other Ambulatory Visit: Payer: Self-pay | Admitting: Cardiology

## 2020-05-12 DIAGNOSIS — Z20822 Contact with and (suspected) exposure to covid-19: Secondary | ICD-10-CM

## 2020-05-13 LAB — NOVEL CORONAVIRUS, NAA: SARS-CoV-2, NAA: NOT DETECTED

## 2020-05-13 LAB — SARS-COV-2, NAA 2 DAY TAT

## 2020-06-02 ENCOUNTER — Other Ambulatory Visit: Payer: Self-pay

## 2020-06-02 ENCOUNTER — Emergency Department (HOSPITAL_COMMUNITY): Payer: BC Managed Care – PPO

## 2020-06-02 ENCOUNTER — Emergency Department (HOSPITAL_COMMUNITY)
Admission: EM | Admit: 2020-06-02 | Discharge: 2020-06-02 | Disposition: A | Discharge status: Home / Self Care | Payer: BC Managed Care – PPO | Attending: Emergency Medicine | Admitting: Emergency Medicine

## 2020-06-02 DIAGNOSIS — M791 Myalgia, unspecified site: Secondary | ICD-10-CM | POA: Diagnosis present

## 2020-06-02 DIAGNOSIS — R6883 Chills (without fever): Secondary | ICD-10-CM | POA: Insufficient documentation

## 2020-06-02 DIAGNOSIS — U071 COVID-19: Secondary | ICD-10-CM | POA: Insufficient documentation

## 2020-06-02 DIAGNOSIS — Z5321 Procedure and treatment not carried out due to patient leaving prior to being seen by health care provider: Secondary | ICD-10-CM | POA: Diagnosis not present

## 2020-06-02 LAB — SARS CORONAVIRUS 2 BY RT PCR (HOSPITAL ORDER, PERFORMED IN ~~LOC~~ HOSPITAL LAB): SARS Coronavirus 2: POSITIVE — AB

## 2020-06-02 MED ORDER — ACETAMINOPHEN 500 MG PO TABS
1000.0000 mg | ORAL_TABLET | Freq: Once | ORAL | Status: DC
  Filled 2020-06-02: qty 2

## 2020-06-02 NOTE — ED Triage Notes (Signed)
Patient complains of body aches and chiils x 1 day. States that her son just diagnosed with covid this week. Alert and oriented. NAD

## 2020-06-03 ENCOUNTER — Other Ambulatory Visit: Payer: Self-pay | Admitting: Infectious Diseases

## 2020-06-03 ENCOUNTER — Telehealth: Payer: Self-pay | Admitting: Infectious Diseases

## 2020-06-03 DIAGNOSIS — U071 COVID-19: Secondary | ICD-10-CM

## 2020-06-03 DIAGNOSIS — Z6829 Body mass index (BMI) 29.0-29.9, adult: Secondary | ICD-10-CM

## 2020-06-03 NOTE — Telephone Encounter (Signed)
Called to Discuss with patient about Covid symptoms and the use of the monoclonal antibody infusion for those with mild to moderate Covid symptoms and at a high risk of hospitalization.     Pt appears to qualify for this infusion due to co-morbid conditions and/or a member of an at-risk group in accordance with the FDA Emergency Use Authorization.   She found out just now +COVID Her oldest son (44) tested positive and other 2 smaller children are now having headaches and sore throats (3 and 5).  She has severe body aches and chills. Her head is "going to explode". She is now on day 3 of symptoms.  She wishes to proceed with scheduling.     Rexene Alberts, MSN, NP-C [facility]  [email or web address] Pager: [phone] Office: [phone] [facility] Main Line: [phone]

## 2020-06-03 NOTE — Progress Notes (Signed)
I connected by phone with [name] [name] on 06/03/2020 at 10:38 AM to discuss the potential use of a new treatment for mild to moderate COVID-19 viral infection in non-hospitalized patients.  This patient is a 34 y.o. female that meets the FDA criteria for Emergency Use Authorization of COVID monoclonal antibody casirivimab/imdevimab.  Has a (+) direct SARS-CoV-2 viral test result  Has mild or moderate COVID-19   Is NOT hospitalized due to COVID-19  Is within 10 days of symptom onset  Has at least one of the high risk factor(s) for progression to severe COVID-19 and/or hospitalization as defined in EUA.  Specific high risk criteria : BMI > 25 and Other high risk medical condition per CDC:  SVI risk    I have spoken and communicated the following to the patient or parent/caregiver regarding COVID monoclonal antibody treatment:  1. FDA has authorized the emergency use for the treatment of mild to moderate COVID-19 in adults and pediatric patients with positive results of direct SARS-CoV-2 viral testing who are 32 years of age and older weighing at least 40 kg, and who are at high risk for progressing to severe COVID-19 and/or hospitalization.  2. The significant known and potential risks and benefits of COVID monoclonal antibody, and the extent to which such potential risks and benefits are unknown.  3. Information on available alternative treatments and the risks and benefits of those alternatives, including clinical trials.  4. Patients treated with COVID monoclonal antibody should continue to self-isolate and use infection control measures (e.g., wear mask, isolate, social distance, avoid sharing personal items, clean and disinfect "high touch" surfaces, and frequent handwashing) according to CDC guidelines.   5. The patient or parent/caregiver has the option to accept or refuse COVID monoclonal antibody treatment.  After reviewing this information with the patient, The patient agreed  to proceed with receiving casirivimab\imdevimab infusion and will be provided a copy of the Fact sheet prior to receiving the infusion. Rexene Alberts 06/03/2020 10:38 AM

## 2020-06-04 ENCOUNTER — Ambulatory Visit (HOSPITAL_COMMUNITY)
Admission: RE | Admit: 2020-06-04 | Discharge: 2020-06-04 | Disposition: A | Discharge status: Home / Self Care | Payer: BC Managed Care – PPO | Source: Ambulatory Visit | Attending: Pulmonary Disease | Admitting: Pulmonary Disease

## 2020-06-04 DIAGNOSIS — U071 COVID-19: Secondary | ICD-10-CM

## 2020-06-04 DIAGNOSIS — Z6829 Body mass index (BMI) 29.0-29.9, adult: Secondary | ICD-10-CM | POA: Insufficient documentation

## 2020-06-04 MED ORDER — DIPHENHYDRAMINE HCL 50 MG/ML IJ SOLN: 50.0000 mg | Freq: Once | INTRAMUSCULAR | Status: DC | PRN

## 2020-06-04 MED ORDER — EPINEPHRINE 0.3 MG/0.3ML IJ SOAJ: 0.3000 mg | Freq: Once | INTRAMUSCULAR | Status: DC | PRN

## 2020-06-04 MED ORDER — FAMOTIDINE IN NACL 20-0.9 MG/50ML-% IV SOLN: 20.0000 mg | Freq: Once | INTRAVENOUS | Status: DC | PRN

## 2020-06-04 MED ORDER — SODIUM CHLORIDE 0.9 % IV SOLN
1200.0000 mg | Freq: Once | INTRAVENOUS | Status: AC
  Administered 2020-06-04: 1200 mg via INTRAVENOUS
  Filled 2020-06-04: qty 10

## 2020-06-04 MED ORDER — ALBUTEROL SULFATE HFA 108 (90 BASE) MCG/ACT IN AERS: 2.0000 | INHALATION_SPRAY | Freq: Once | RESPIRATORY_TRACT | Status: DC | PRN

## 2020-06-04 MED ORDER — METHYLPREDNISOLONE SODIUM SUCC 125 MG IJ SOLR: 125.0000 mg | Freq: Once | INTRAMUSCULAR | Status: DC | PRN

## 2020-06-04 MED ORDER — SODIUM CHLORIDE 0.9 % IV SOLN: INTRAVENOUS | Status: DC | PRN

## 2020-06-04 NOTE — Discharge Instructions (Signed)
10 Things You Can Do to Manage Your COVID-19 Symptoms at Home If you have possible or confirmed COVID-19: 1. Stay home from work and school. And stay away from other public places. If you must go out, avoid using any kind of public transportation, ridesharing, or taxis. 2. Monitor your symptoms carefully. If your symptoms get worse, call your healthcare provider immediately. 3. Get rest and stay hydrated. 4. If you have a medical appointment, call the healthcare provider ahead of time and tell them that you have or may have COVID-19. 5. For medical emergencies, call 911 and notify the dispatch personnel that you have or may have COVID-19. 6. Cover your cough and sneezes with a tissue or use the inside of your elbow. 7. Wash your hands often with soap and water for at least 20 seconds or clean your hands with an alcohol-based hand sanitizer that contains at least 60% alcohol. 8. As much as possible, stay in a specific room and away from other people in your home. Also, you should use a separate bathroom, if available. If you need to be around other people in or outside of the home, wear a mask. 9. Avoid sharing personal items with other people in your household, like dishes, towels, and bedding. 10. Clean all surfaces that are touched often, like counters, tabletops, and doorknobs. Use household cleaning sprays or wipes according to the label instructions. cdc.gov/coronavirus 04/03/2019 This information is not intended to replace advice given to you by your health care provider. Make sure you discuss any questions you have with your health care provider. Document Revised: 09/05/2019 Document Reviewed: 09/05/2019 Elsevier Patient Education  2020 Elsevier Inc.  What types of side effects do monoclonal antibody drugs cause?  Common side effects  In general, the more common side effects caused by monoclonal antibody drugs include: . Allergic reactions, such as hives or itching . Flu-like signs and  symptoms, including chills, fatigue, fever, and muscle aches and pains . Nausea, vomiting . Diarrhea . Skin rashes . Low blood pressure   The CDC is recommending patients who receive monoclonal antibody treatments wait at least 90 days before being vaccinated.  Currently, there are no data on the safety and efficacy of mRNA COVID-19 vaccines in persons who received monoclonal antibodies or convalescent plasma as part of COVID-19 treatment. Based on the estimated half-life of such therapies as well as evidence suggesting that reinfection is uncommon in the 90 days after initial infection, vaccination should be deferred for at least 90 days, as a precautionary measure until additional information becomes available, to avoid interference of the antibody treatment with vaccine-induced immune responses.  What types of side effects do monoclonal antibody drugs cause?  Common side effects  In general, the more common side effects caused by monoclonal antibody drugs include: . Allergic reactions, such as hives or itching . Flu-like signs and symptoms, including chills, fatigue, fever, and muscle aches and pains . Nausea, vomiting . Diarrhea . Skin rashes . Low blood pressure   The CDC is recommending patients who receive monoclonal antibody treatments wait at least 90 days before being vaccinated.  Currently, there are no data on the safety and efficacy of mRNA COVID-19 vaccines in persons who received monoclonal antibodies or convalescent plasma as part of COVID-19 treatment. Based on the estimated half-life of such therapies as well as evidence suggesting that reinfection is uncommon in the 90 days after initial infection, vaccination should be deferred for at least 90 days, as a precautionary measure until   additional information becomes available, to avoid interference of the antibody treatment with vaccine-induced immune responses. 

## 2020-06-04 NOTE — Progress Notes (Signed)
  Diagnosis: COVID-19  Physician:Dr wright  Procedure: Covid Infusion Clinic Med: casirivimab\imdevimab infusion - Provided patient with casirivimab\imdevimab fact sheet for patients, parents and caregivers prior to infusion.  Complications: No immediate complications noted.  Discharge: Discharged home   Chrisandra Netters Apple 06/04/2020

## 2020-06-05 ENCOUNTER — Telehealth: Payer: Self-pay | Admitting: Registered Nurse

## 2020-06-05 ENCOUNTER — Encounter: Payer: Self-pay | Admitting: Family Medicine

## 2020-06-05 NOTE — Telephone Encounter (Signed)
Pt was diagnosed with Covid on 06/02/20. She would like to know how long she should quarantine and when she can return to work. Please advise at  [phone]

## 2020-06-05 NOTE — Telephone Encounter (Signed)
Please change to virtual. Usual recommendations for isolation is usually 10 days as long as other criteria are met. We can discuss at virtual visit. Thanks

## 2020-06-05 NOTE — Telephone Encounter (Signed)
Called pt and informed her that her 06/09/20 visit is switched to mychart video and they will discuss further steps at that visit.

## 2020-06-05 NOTE — Telephone Encounter (Signed)
Pt has an upcoming appointment with you on 06/09/20 should that one be turned virtual since pt is covid pos and she wants to get some recommendations on how long to quarantine and a letter to return to work when the time come.

## 2020-06-09 ENCOUNTER — Telehealth: Payer: BLUE CROSS/BLUE SHIELD | Admitting: Family Medicine

## 2020-06-10 ENCOUNTER — Other Ambulatory Visit: Payer: Self-pay

## 2020-06-10 ENCOUNTER — Encounter: Payer: Self-pay | Admitting: Registered Nurse

## 2020-06-10 ENCOUNTER — Telehealth (INDEPENDENT_AMBULATORY_CARE_PROVIDER_SITE_OTHER): Payer: BC Managed Care – PPO | Admitting: Registered Nurse

## 2020-06-10 DIAGNOSIS — G43019 Migraine without aura, intractable, without status migrainosus: Secondary | ICD-10-CM | POA: Diagnosis not present

## 2020-06-10 MED ORDER — TOPIRAMATE 100 MG PO TABS: 100.0000 mg | ORAL_TABLET | Freq: Every day | ORAL | 4 refills | Status: DC

## 2020-06-10 MED ORDER — FROVATRIPTAN SUCCINATE 2.5 MG PO TABS: 2.5000 mg | ORAL_TABLET | ORAL | 4 refills | Status: DC | PRN

## 2020-06-10 MED ORDER — NORTRIPTYLINE HCL 50 MG PO CAPS: 50.0000 mg | ORAL_CAPSULE | Freq: Every day | ORAL | 0 refills | Status: DC

## 2020-06-10 NOTE — Patient Instructions (Signed)
° ° ° °  If you have lab work done today you will be contacted with your lab results within the next 2 weeks.  If you have not heard from us then please contact us. The fastest way to get your results is to register for My Chart. ° ° °IF you received an x-ray today, you will receive an invoice from [facility]. Please contact [facility] at [phone] with questions or concerns regarding your invoice.  ° °IF you received labwork today, you will receive an invoice from [facility]. Please contact [facility] at [phone] with questions or concerns regarding your invoice.  ° °Our billing staff will not be able to assist you with questions regarding bills from these companies. ° °You will be contacted with the lab results as soon as they are available. The fastest way to get your results is to activate your My Chart account. Instructions are located on the last page of this paperwork. If you have not heard from us regarding the results in 2 weeks, please contact this office. °  ° ° ° °

## 2020-06-21 NOTE — Progress Notes (Signed)
Acute Office Visit  Subjective:    Patient ID: [name] [name], female    DOB: 09/29/1986, 34 y.o.   MRN: [id number]  Chief Complaint  Patient presents with  . Hospitalization Follow-up    Patient states she has a Upper respiratory infection and is to follow up from ER. Also to discuss paperwork    HPI Patient is in today for hospital follow up   Was seen in ED for symptoms of upper respiratory infection.  Notes congestion, shob, cough, fatigue, and diarrhea. Her child had similar symptoms Not vaccinated against COVID-19 xrays and covid test in ER negative Given albuterol and steroids  Now having worsening symptoms again, more productive cough, wheezing, shob, fatigue. Feels that things improved after ED but are getting worse again No new sick contacts, no change in taste or smell. Still some loose stool. No vomiting or nausea Past Medical History:  Diagnosis Date  . Allergy   . Common migraine with intractable migraine 03/16/2018  . Depression   . Gonorrhea   . Headache(784.0)   . Infection    UTI  . Shoulder dystocia, delivered, current hospitalization 06/14/2014  . Vaginal Pap smear, abnormal    f/u ok    Past Surgical History:  Procedure Laterality Date  . CESAREAN SECTION N/A    Phreesia 06/06/2020  . CESAREAN SECTION WITH BILATERAL TUBAL LIGATION Bilateral 07/20/2016   Procedure: REPEAT CESAREAN SECTION WITH BILATERAL TUBAL LIGATION;  Surgeon: Jaymes GraffNaima Dillard, MD;  Location: [facility];  Service: Obstetrics;  Laterality: Bilateral;  Provider requests RNFA -ap    Family History  Problem Relation Age of Onset  . Healthy Mother   . Hearing loss Neg Hx     Social History   Socioeconomic History  . Marital status: Married    Spouse name: Not on file  . Number of children: 3  . Years of education: Not on file  . 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 Smoking status: Former  Games developermoker  . Smokeless tobacco: Never Used  Vaping Use  . Vaping Use: Never used  Substance and Sexual Activity  . Alcohol use: Yes    Comment: occasionally  . Drug use: Yes    Types: Marijuana    Comment: occasionally  . Sexual activity: Yes    Birth control/protection: None  Other Topics Concern  . Not on file  Social History Narrative  . Not on file   Social Determinants of Health   Financial Resource Strain:   . Difficulty of Paying Living Expenses: Not on file  Food Insecurity:   . Worried About Programme researcher, broadcasting/film/videounning Out of Food in the Last Year: Not on file  . Ran Out of Food in the Last Year: Not on file  Transportation Needs:   . Lack of Transportation (Medical): Not on file  . Lack of Transportation (Non-Medical): Not on file  Physical Activity:   . Days of Exercise per Week: Not on file  . Minutes of Exercise per Session: Not on file  Stress:   . Feeling of Stress : Not on file  Social Connections:   . Frequency of Communication with Friends and Family: Not on file  . Frequency of Social Gatherings with Friends and Family: Not on file  . Attends Religious Services: Not on file  . Active Member of Clubs or Organizations: Not on file  . Attends BankerClub or Organization Meetings: Not on  file  . Marital Status: Not on file  Intimate Partner Violence:   . Fear of Current or Ex-Partner: Not on file  . Emotionally Abused: Not on file  . Physically Abused: Not on file  . Sexually Abused: Not on file    Outpatient Medications Prior to Visit  Medication Sig Dispense Refill  . albuterol (VENTOLIN HFA) 108 (90 Base) MCG/ACT inhaler Inhale 1-2 puffs into the lungs every 6 (six) hours as needed for wheezing or shortness of breath. 18 g 0  . cetirizine (ZYRTEC ALLERGY) 10 MG tablet Take 1 tablet (10 mg total) by mouth daily. 10 tablet 0  . dextromethorphan-guaiFENesin (MUCINEX DM) 30-600 MG 12hr tablet Take 1 tablet by mouth 2 (two) times daily as needed for cough.    .  DM-Doxylamine-Acetaminophen (NYQUIL COLD & FLU PO) Take 1 tablet by mouth 2 (two) times daily as needed (cold symptoms).    . fluticasone (FLONASE) 50 MCG/ACT nasal spray Place 2 sprays into both nostrils daily. 16 g 0  . Fremanezumab-vfrm (AJOVY) 225 MG/1.5ML SOSY Inject 225 mg into the skin every 30 (thirty) days. 1 Syringe 6  . guaiFENesin-dextromethorphan (ROBITUSSIN DM) 100-10 MG/5ML syrup Take 10 mLs by mouth at bedtime as needed for cough. (Patient taking differently: Take 10 mLs by mouth 2 (two) times daily as needed for cough. ) 118 mL 0  . nortriptyline (PAMELOR) 50 MG capsule Take 2 capsules (100 mg total) by mouth at bedtime. 60 capsule 6  . olopatadine (PATADAY) 0.1 % ophthalmic solution Place 1 drop into both eyes 2 (two) times daily. 5 mL 12  . predniSONE (DELTASONE) 10 MG tablet Take 5 tablets (50 mg total) by mouth daily. 25 tablet 0  . Pseudoephedrine-APAP-DM (DAYQUIL PO) Take 1 tablet by mouth 2 (two) times daily as needed (cold symptoms).    . frovatriptan (FROVA) 2.5 MG tablet Take 1 tablet (2.5 mg total) by mouth as needed for migraine. If recurs, may repeat after 2 hours. Max of 2 tabs in 24 hours. 10 tablet 4  . topiramate (TOPAMAX) 100 MG tablet Take 1 tablet (100 mg total) by mouth daily. 30 tablet 4  . ipratropium (ATROVENT) 0.03 % nasal spray Place 2 sprays into both nostrils 2 (two) times daily. 30 mL 0  . benzonatate (TESSALON) 100 MG capsule Take 1 capsule (100 mg total) by mouth every 8 (eight) hours. (Patient not taking: Reported on 03/01/2020) 21 capsule 0   No facility-administered medications prior to visit.    Allergies  Allergen Reactions  . Pollen Extract Itching    Review of Systems  Constitutional: Positive for fatigue and fever.  HENT: Negative.   Eyes: Negative.   Respiratory: Positive for cough, chest tightness, shortness of breath and wheezing.   Cardiovascular: Negative.   Gastrointestinal: Negative.   Endocrine: Negative.   Genitourinary:  Negative.   Musculoskeletal: Negative.   Skin: Negative.   Allergic/Immunologic: Negative.   Neurological: Negative.   Hematological: Negative.   Psychiatric/Behavioral: Negative.        Objective:    Physical Exam Vitals and nursing note reviewed.  Constitutional:      General: She is not in acute distress.    Appearance: Normal appearance. She is normal weight. She is not ill-appearing, toxic-appearing or diaphoretic.  Cardiovascular:     Rate and Rhythm: Normal rate and regular rhythm.     Heart sounds: Normal heart sounds. No murmur heard.  No friction rub. No gallop.   Pulmonary:     Effort:  Pulmonary effort is normal. No respiratory distress.     Breath sounds: No stridor. Wheezing (bilateral throughout) present. No rhonchi or rales.     Comments: Breath sounds decreased at bases Chest:     Chest wall: Tenderness present.  Skin:    General: Skin is warm and dry.     Capillary Refill: Capillary refill takes less than 2 seconds.  Neurological:     General: No focal deficit present.     Mental Status: She is alert and oriented to person, place, and time. Mental status is at baseline.  Psychiatric:        Mood and Affect: Mood normal.        Behavior: Behavior normal.        Thought Content: Thought content normal.        Judgment: Judgment normal.     BP 106/71   Pulse 75   Temp 98.1 F (36.7 C) (Temporal)   Resp 17   Ht 5\' 1"  (1.549 m)   Wt 157 lb 3.2 oz (71.3 kg)   LMP 02/19/2020   SpO2 96%   BMI 29.70 kg/m  Wt Readings from Last 3 Encounters:  06/02/20 154 lb (69.9 kg)  03/06/20 157 lb 3.2 oz (71.3 kg)  03/01/20 145 lb (65.8 kg)    Health Maintenance Due  Topic Date Due  . PAP SMEAR-Modifier  11/05/2016  . INFLUENZA VACCINE  05/03/2020    There are no preventive care reminders to display for this patient.   No results found for: TSH Lab Results  Component Value Date   WBC 6.2 03/01/2020   HGB 14.9 03/01/2020   HCT 43.5 03/01/2020   MCV  86.3 03/01/2020   PLT 206 03/01/2020   Lab Results  Component Value Date   NA 141 03/01/2020   K 3.7 03/01/2020   CO2 23 03/01/2020   GLUCOSE 96 03/01/2020   BUN 12 03/01/2020   CREATININE 0.74 03/01/2020   BILITOT 1.0 03/01/2020   ALKPHOS 47 03/01/2020   AST 22 03/01/2020   ALT 25 03/01/2020   PROT 8.3 (H) 03/01/2020   ALBUMIN 4.4 03/01/2020   CALCIUM 10.2 03/01/2020   ANIONGAP 10 03/01/2020   No results found for: CHOL No results found for: HDL No results found for: [facility] Lab Results  Component Value Date   TRIG 127 03/01/2020   No results found for: CHOLHDL No results found for: 03/03/2020     Assessment & Plan:   Problem List Items Addressed This Visit    None    Visit Diagnoses    Lower respiratory infection    -  Primary   Relevant Medications   azithromycin (ZITHROMAX) 250 MG tablet   benzonatate (TESSALON) 200 MG capsule       Meds ordered this encounter  Medications  . azithromycin (ZITHROMAX) 250 MG tablet    Sig: Take 2 tabs on first day. Then take 1 tab daily. Finish entire supply.    Dispense:  6 tablet    Refill:  0    Order Specific Question:   Supervising Provider    Answer:   [id number], JEFFREY R [[id number]]  . benzonatate (TESSALON) 200 MG capsule    Sig: Take 1 capsule (200 mg total) by mouth 2 (two) times daily as needed for cough.    Dispense:  20 capsule    Refill:  0    Order Specific Question:   Supervising Provider    Answer:   Neva Seat, JEFFREY R [[id number]]  PLAN  Concerned that she has developed a bacterial infection secondary to her viral infection in the ED. Will give z pack and tessalon   Return prn  Discussed symptoms of covid and when to present to ED  Patient encouraged to call clinic with any questions, comments, or concerns.  Janeece Agee, NP

## 2020-06-24 ENCOUNTER — Telehealth: Payer: Self-pay

## 2020-06-24 NOTE — Telephone Encounter (Signed)
Forms done and placed by your computer.   Thank you  [name], NP

## 2020-06-24 NOTE — Telephone Encounter (Signed)
Pt is came into the office to drop off paperwork for FMLA for [name] [name] to complete due to being out for COVID.   She was out from Aug 30- September 16  SHE HAS NOT PAID THE $15 fee Yet. SHE WILL PAY UPON PICK UP.   FMLA forms placed in provider box to be completed.

## 2020-06-26 NOTE — Telephone Encounter (Signed)
I have called pt and informed her that her FMLA forms are ready for pick up at the front Dest under the M tab.   A copy has been placed in the scan bin and another copy has been placed in the done file with Nehemiah Settle.   Pt stated understanding and will come by to pick up her forms and PAY the fee FMLA fee as well.

## 2020-07-01 ENCOUNTER — Encounter: Payer: Self-pay | Admitting: Neurology

## 2020-07-01 ENCOUNTER — Ambulatory Visit (INDEPENDENT_AMBULATORY_CARE_PROVIDER_SITE_OTHER): Payer: BC Managed Care – PPO | Admitting: Neurology

## 2020-07-01 DIAGNOSIS — G43019 Migraine without aura, intractable, without status migrainosus: Secondary | ICD-10-CM | POA: Diagnosis not present

## 2020-07-01 MED ORDER — NORTRIPTYLINE HCL 50 MG PO CAPS: 50.0000 mg | ORAL_CAPSULE | Freq: Every day | ORAL | 3 refills | Status: DC

## 2020-07-01 MED ORDER — AJOVY 225 MG/1.5ML ~~LOC~~ SOSY: 225.0000 mg | PREFILLED_SYRINGE | SUBCUTANEOUS | 11 refills | Status: DC

## 2020-07-01 NOTE — Progress Notes (Signed)
I have read the note, and I agree with the clinical assessment and plan.  Lovenia Debruler K Horald Birky   

## 2020-07-01 NOTE — Patient Instructions (Signed)
Continue current medications  Restart Ajovy  See you back in 1 year

## 2020-07-01 NOTE — Progress Notes (Signed)
PATIENT: [name] [name] DOB: May 29, 1986  REASON FOR VISIT: follow up HISTORY FROM: patient  HISTORY OF PRESENT ILLNESS: Today 07/01/20  [name] [name] is a 34 year old female history of migraine headache. She has been off Ajovy for about 3 months.  Remains on nortriptyline, but dose has been decreased to 50 mg at bedtime, is on Topamax 100 mg daily, Frova as needed.  She had Covid earlier this month, responded well to monoclonal antibody infusion, feels like a new person.  Was out of work for over a month, just went back 9/16, she works at [employer].  Headaches have increased when she went back to work, and since off Ajovy.  Currently having 7-10 migraines a month, most around her menstrual cycle, Ajovy more than 50% beneficial for migraines.  She works from home, has 3 kids.  Presents today for evaluation unaccompanied.  HISTORY 01/30/2019 SS: [name] [name] is a 34 year old female with history of migraine headaches.  She is currently taking Topamax 100 mg at bedtime, nortriptyline 100 mg at bedtime, Ajovy monthly injection, Frova since many of headaches occur during her menstrual cycle.  Her headaches have decreased down to 4 to 5/month.  She used to have 10-11 headaches a month.  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  She did get a one-time Ajovy injection in the office in February 2020, however was never able to get Ajovy from the pharmacy due to insurance issues.  She said during the month she got the injection, her headache management was very good.  She denies any new problems or concerns.  She denies any changes to her medications or medical history.   REVIEW OF SYSTEMS: Out of a complete 14 system review of symptoms, the patient complains only of the following symptoms, and all other reviewed systems are negative.  Headache  ALLERGIES: Allergies  Allergen Reactions  . Pollen Extract Itching     HOME MEDICATIONS: Outpatient Medications Prior to Visit  Medication Sig Dispense Refill  . albuterol (VENTOLIN HFA) 108 (90 Base) MCG/ACT inhaler Inhale 1-2 puffs into the lungs every 6 (six) hours as needed for wheezing or shortness of breath. 18 g 0  . cetirizine (ZYRTEC ALLERGY) 10 MG tablet Take 1 tablet (10 mg total) by mouth daily. 10 tablet 0  . dextromethorphan-guaiFENesin (MUCINEX DM) 30-600 MG 12hr tablet Take 1 tablet by mouth 2 (two) times daily as needed for cough.    . DM-Doxylamine-Acetaminophen (NYQUIL COLD & FLU PO) Take 1 tablet by mouth 2 (two) times daily as needed (cold symptoms).    . fluticasone (FLONASE) 50 MCG/ACT nasal spray Place 2 sprays into both nostrils daily. 16 g 0  . frovatriptan (FROVA) 2.5 MG tablet Take 1 tablet (2.5 mg total) by mouth as needed for migraine. If recurs, may repeat after 2 hours. Max of 2 tabs in 24 hours. 10 tablet 4  . guaiFENesin-dextromethorphan (ROBITUSSIN DM) 100-10 MG/5ML syrup Take 10 mLs by mouth at bedtime as needed for cough. (Patient taking differently: Take 10 mLs by mouth 2 (two) times daily as needed for cough. ) 118 mL 0  . ipratropium (ATROVENT) 0.03 % nasal spray Place 2 sprays into both nostrils 2 (two) times daily. 30 mL 0  . olopatadine (PATADAY) 0.1 % ophthalmic solution Place 1 drop into both eyes 2 (two) times daily. 5 mL 12  . Pseudoephedrine-APAP-DM (DAYQUIL PO) Take 1 tablet  by mouth 2 (two) times daily as needed (cold symptoms).    . topiramate (TOPAMAX) 100 MG tablet Take 1 tablet (100 mg total) by mouth daily. 30 tablet 4  . Fremanezumab-vfrm (AJOVY) 225 MG/1.5ML SOSY Inject 225 mg into the skin every 30 (thirty) days. 1 Syringe 6  . nortriptyline (PAMELOR) 50 MG capsule Take 2 capsules (100 mg total) by mouth at bedtime. 60 capsule 6  . nortriptyline (PAMELOR) 50 MG capsule Take 1 capsule (50 mg total) by mouth at bedtime. 60 capsule 0  . azithromycin (ZITHROMAX) 250 MG tablet Take 2 tabs on first day. Then  take 1 tab daily. Finish entire supply. 6 tablet 0  . benzonatate (TESSALON) 200 MG capsule Take 1 capsule (200 mg total) by mouth 2 (two) times daily as needed for cough. 20 capsule 0  . predniSONE (DELTASONE) 10 MG tablet Take 5 tablets (50 mg total) by mouth daily. 25 tablet 0   No facility-administered medications prior to visit.    PAST MEDICAL HISTORY: Past Medical History:  Diagnosis Date  . Allergy   . Common migraine with intractable migraine 03/16/2018  . Depression   . Gonorrhea   . Headache(784.0)   . Infection    UTI  . Shoulder dystocia, delivered, current hospitalization 06/14/2014  . Vaginal Pap smear, abnormal    f/u ok    PAST SURGICAL HISTORY: Past Surgical History:  Procedure Laterality Date  . CESAREAN SECTION N/A    Phreesia 06/06/2020  . CESAREAN SECTION WITH BILATERAL TUBAL LIGATION Bilateral 07/20/2016   Procedure: REPEAT CESAREAN SECTION WITH BILATERAL TUBAL LIGATION;  Surgeon: Jaymes Graff, MD;  [facility];  Service: Obstetrics;  Laterality: Bilateral;  Provider requests RNFA -ap    FAMILY HISTORY: Family History  Problem Relation Age of Onset  . Healthy Mother   . Hearing loss Neg Hx     SOCIAL HISTORY: Social History   Socioeconomic History  . Marital status: Married    Spouse name: Not on file  . Number of children: 3  . Years of education: Not on file  . 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 Smoking status: Former Games developer  . Smokeless tobacco: Never Used  Vaping Use  . Vaping Use: Never used  Substance and Sexual Activity  . Alcohol use: Yes    Comment: occasionally  . Drug use: Yes    Types: Marijuana    Comment: occasionally  . Sexual activity: Yes    Birth control/protection: None  Other Topics Concern  . Not on file  Social History Narrative  . Not on file   Social Determinants of Health   Financial Resource Strain:   .  Difficulty of Paying Living Expenses: Not on file  Food Insecurity:   . Worried About Programme researcher, broadcasting/film/video in the Last Year: Not on file  . Ran Out of Food in the Last Year: Not on file  Transportation Needs:   . Lack of Transportation (Medical): Not on file  . Lack of Transportation (Non-Medical): Not on file  Physical Activity:   . Days of Exercise per Week: Not on file  . Minutes of Exercise per Session: Not on file  Stress:   . Feeling of Stress : Not on file  Social Connections:   . Frequency of Communication with Friends and Family: Not on file  . Frequency of Social Gatherings with Friends and Family: Not  on file  . Attends Religious Services: Not on file  . Active Member of Clubs or Organizations: Not on file  . Attends Banker Meetings: Not on file  . Marital Status: Not on file  Intimate Partner Violence:   . Fear of Current or Ex-Partner: Not on file  . Emotionally Abused: Not on file  . Physically Abused: Not on file  . Sexually Abused: Not on file   PHYSICAL EXAM  Vitals:   07/01/20 1248  BP: 126/82  Pulse: 87  Weight: 154 lb 3.2 oz (69.9 kg)  Height: 5\' 1"  (1.549 m)   Body mass index is 29.14 kg/m.  Generalized: Well developed, in no acute distress   Neurological examination  Mentation: Alert oriented to time, place, history taking. Follows all commands speech and language fluent Cranial nerve II-XII: Pupils were equal round reactive to light. Extraocular movements were full, visual field were full on confrontational test. Facial sensation and strength were normal. Head turning and shoulder shrug  were normal and symmetric. Motor: The motor testing reveals 5 over 5 strength of all 4 extremities. Good symmetric motor tone is noted throughout.  Sensory: Sensory testing is intact to soft touch on all 4 extremities. No evidence of extinction is noted.  Coordination: Cerebellar testing reveals good finger-nose-finger and heel-to-shin bilaterally.    Gait and station: Gait is normal.  Reflexes: Deep tendon reflexes are symmetric and normal bilaterally.   DIAGNOSTIC DATA (LABS, IMAGING, TESTING) - I reviewed patient records, labs, notes, testing and imaging myself where available.  Lab Results  Component Value Date   WBC 6.2 03/01/2020   HGB 14.9 03/01/2020   HCT 43.5 03/01/2020   MCV 86.3 03/01/2020   PLT 206 03/01/2020      Component Value Date/Time   NA 141 03/01/2020 1426   K 3.7 03/01/2020 1426   CL 108 03/01/2020 1426   CO2 23 03/01/2020 1426   GLUCOSE 96 03/01/2020 1426   GLUCOSE 65 (L) 04/10/2014 1556   BUN 12 03/01/2020 1426   CREATININE 0.74 03/01/2020 1426   CALCIUM 10.2 03/01/2020 1426   PROT 8.3 (H) 03/01/2020 1426   ALBUMIN 4.4 03/01/2020 1426   AST 22 03/01/2020 1426   ALT 25 03/01/2020 1426   ALKPHOS 47 03/01/2020 1426   BILITOT 1.0 03/01/2020 1426   GFRNONAA >60 03/01/2020 1426   GFRAA >60 03/01/2020 1426   Lab Results  Component Value Date   TRIG 127 03/01/2020   No results found for: HGBA1C No results found for: VITAMINB12 No results found for: TSH    ASSESSMENT AND PLAN 34 y.o. year old female  has a past medical history of Allergy, Common migraine with intractable migraine (03/16/2018), Depression, Gonorrhea, Headache(784.0), Infection, Shoulder dystocia, delivered, current hospitalization (06/14/2014), and Vaginal Pap smear, abnormal. here with:  1.  Migraine headache -Restart Ajovy 225 mg monthly injection for migraine prevention -Continue nortriptyline 50 mg at bedtime -Continue Topamax 100 mg at bedtime -Continue Frova as needed for acute headache -Will send FMLA papers for 08/14/2014 to complete -Follow-up in 1 year or sooner if needed  I spent 20 minutes of face-to-face and non-face-to-face time with patient.  This included previsit chart review, lab review, study review, order entry, electronic health record documentation, patient education.  [place], AGNP-C, DNP 07/01/2020, 1:15  PM [facility] [address], [address] [place], [place] [phone]

## 2020-07-24 ENCOUNTER — Encounter: Payer: Self-pay | Admitting: Neurology

## 2020-07-27 NOTE — Telephone Encounter (Signed)
I have the last FMLA done, it shows 7 episodes in a month (back in1-20-20).  Pt is asking how many we are going to give her.  Your last note states 7-10 in a month.

## 2020-08-13 NOTE — Progress Notes (Signed)
Telemedicine Encounter- SOAP NOTE Established Patient  This telephone encounter was conducted with the patient's (or proxy's) verbal consent via audio telecommunications: yes  Patient was instructed to have this encounter in a suitably private space; and to only have persons present to whom they give permission to participate. In addition, patient identity was confirmed by use of name plus two identifiers (DOB and address).  I discussed the limitations, risks, security and privacy concerns of performing an evaluation and management service by telephone and the availability of in person appointments. I also discussed with the patient that there may be a patient responsible charge related to this service. The patient expressed understanding and agreed to proceed.  I spent a total of 15 minutes talking with the patient or their proxy.  Chief Complaint  Patient presents with   Follow-up    patient states last tuesday she tested positive for covid and she still have no smell or taste and severe headaches. Patient has been taking tylenol but has not been working.    Subjective   [name] [name] is a 34 y.o. established patient. Telephone visit today for ongoing headaches  HPI Pt is COVID +  No smell or taste. Respiratory symptoms stable - no acute concerns Headaches are severe and constant. Has been taking OTCs - advised tylenol at last visit - no relief No peripheral neuro symptoms.  No visual changes, chest pain, claudication, shob, doe, or palpitations.  Feels like band around head or perhaps migraine. Not worst of life. 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   S/P primary low transverse C-section 07/20/2016   S/P  cesarean section 07/20/2016   History of miscarriage 07/20/2013    Past Medical History:  Diagnosis Date   Allergy    Common migraine with intractable migraine 03/16/2018   Depression    Gonorrhea    Headache(784.0)    Infection    UTI   Shoulder dystocia, delivered, current hospitalization 06/14/2014   Vaginal Pap smear, abnormal    f/u ok    Current Outpatient Medications  Medication Sig Dispense Refill   albuterol (VENTOLIN HFA) 108 (90 Base) MCG/ACT inhaler Inhale 1-2 puffs into the lungs every 6 (six) hours as needed for wheezing or shortness of breath. 18 g 0   cetirizine (ZYRTEC ALLERGY) 10 MG tablet Take 1 tablet (10 mg total) by mouth daily. 10 tablet 0   dextromethorphan-guaiFENesin (MUCINEX DM) 30-600 MG 12hr tablet Take 1 tablet by mouth 2 (two) times daily as needed for cough.     DM-Doxylamine-Acetaminophen (NYQUIL COLD & FLU PO) Take 1 tablet by mouth 2 (two) times daily as needed (cold symptoms).     fluticasone (FLONASE) 50 MCG/ACT nasal spray Place 2 sprays into both nostrils daily. 16 g 0   frovatriptan (FROVA) 2.5 MG tablet Take 1 tablet (2.5 mg total) by mouth as needed for migraine. If recurs, may repeat after 2 hours. Max of 2 tabs in 24 hours. 10 tablet 4   guaiFENesin-dextromethorphan (ROBITUSSIN DM) 100-10 MG/5ML syrup Take 10 mLs by mouth at bedtime as needed for cough. (Patient taking differently: Take 10 mLs by mouth 2 (two) times daily as needed for cough. ) 118 mL 0   ipratropium (ATROVENT) 0.03 % nasal  spray Place 2 sprays into both nostrils 2 (two) times daily. 30 mL 0   olopatadine (PATADAY) 0.1 % ophthalmic solution Place 1 drop into both eyes 2 (two) times daily. 5 mL 12   Pseudoephedrine-APAP-DM (DAYQUIL PO) Take 1 tablet by mouth 2 (two) times daily as needed (cold symptoms).     topiramate (TOPAMAX) 100 MG tablet Take 1 tablet (100 mg total) by mouth daily. 30 tablet 4   Fremanezumab-vfrm (AJOVY) 225 MG/1.5ML SOSY Inject 225 mg  into the skin every 30 (thirty) days. 1.5 mL 11   nortriptyline (PAMELOR) 50 MG capsule Take 1 capsule (50 mg total) by mouth at bedtime. 90 capsule 3   No current facility-administered medications for this visit.    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  Tobacco Use   Smoking status: Former Smoker   Smokeless tobacco: Never Used  Building services engineer Use: Never used  Substance and Sexual Activity   Alcohol use: Yes    Comment: occasionally   Drug use: Yes    Types: Marijuana    Comment: occasionally   Sexual activity: Yes    Birth control/protection: None  Other Topics Concern   Not on file  Social History Narrative   Not on file   Social Determinants of Health   Financial Resource Strain:    Difficulty of Paying Living Expenses: Not on file  Food Insecurity:    Worried About Programme researcher, broadcasting/film/video in the Last Year: Not on file   [employer] of Food in the Last Year: Not on file  Transportation Needs:    Lack of Transportation (Medical): Not on file   Lack of Transportation (Non-Medical): Not on file  Physical Activity:    Days of Exercise per Week: Not on file   Minutes of Exercise per Session: Not on file  Stress:    Feeling of Stress : Not on file  Social Connections:    Frequency of Communication with Friends and Family: Not on file   Frequency of Social Gatherings with Friends and Family: Not on file   Attends Religious Services: Not on file   Active Member of Clubs or Organizations: Not on file   Attends Banker Meetings: Not on file   Marital Status: Not on file  Intimate Partner Violence:    Fear of Current or Ex-Partner: Not on file    Emotionally Abused: Not on file   Physically Abused: Not on file   Sexually Abused: Not on file    Review of Systems  Constitutional: Negative.   HENT: Negative.   Eyes: Negative.   Respiratory: Negative.   Cardiovascular: Negative.   Gastrointestinal: Negative.   Genitourinary: Negative.   Musculoskeletal: Negative.   Skin: Negative.   Neurological: Positive for headaches. Negative for dizziness, tingling, tremors, sensory change, speech change, focal weakness, seizures, loss of consciousness and weakness.  Endo/Heme/Allergies: Negative.   Psychiatric/Behavioral: Negative.     Objective   Vitals as reported by the patient: There were no vitals filed for this visit.  [name] was seen today for follow-up.  Diagnoses and all orders for this visit:  Common migraine with  intractable migraine -     frovatriptan (FROVA) 2.5 MG tablet; Take 1 tablet (2.5 mg total) by mouth as needed for migraine. If recurs, may repeat after 2 hours. Max of 2 tabs in 24 hours. -     topiramate (TOPAMAX) 100 MG tablet; Take 1 tablet (100 mg total) by mouth daily. -     Discontinue: nortriptyline (PAMELOR) 50 MG capsule; Take 1 capsule (50 mg total) by mouth at bedtime.   PLAN  Can try taking nortriptyline 50mg  PO qhs  Frovatriptan 1 tablet immediately, take another two hours later if headache continues  topamax 100mg  PO qd  Reviewed red flags and reasons to go to ED. Pt demonstrates understanding  Reviewed nonpharm   Patient encouraged to call clinic with any questions, comments, or concerns.   I discussed the assessment and treatment plan with the patient. The patient was provided an opportunity to ask questions and all were answered. The patient agreed with the plan and demonstrated an understanding of the instructions.   The patient was advised to call back or seek an in-person evaluation if the symptoms worsen or if the condition fails to improve as anticipated.  I provided 15  minutes of non-face-to-face time during this encounter.  , NP  Primary Care at [facility]

## 2020-08-24 ENCOUNTER — Encounter: Payer: BC Managed Care – PPO | Admitting: Registered Nurse

## 2020-09-07 ENCOUNTER — Ambulatory Visit: Payer: BC Managed Care – PPO | Admitting: Family Medicine

## 2020-09-07 ENCOUNTER — Other Ambulatory Visit: Payer: Self-pay

## 2020-09-07 ENCOUNTER — Encounter: Payer: Self-pay | Admitting: Family Medicine

## 2020-09-07 VITALS — BP 113/75 | HR 80 | Temp 98.0°F | Ht 61.0 in | Wt 152.0 lb

## 2020-09-07 DIAGNOSIS — L989 Disorder of the skin and subcutaneous tissue, unspecified: Secondary | ICD-10-CM | POA: Diagnosis not present

## 2020-09-07 MED ORDER — TRIAMCINOLONE ACETONIDE 0.05 % EX OINT: 1.0000 "application " | TOPICAL_OINTMENT | Freq: Every day | CUTANEOUS | 0 refills | Status: DC

## 2020-09-07 MED ORDER — KETOCONAZOLE 2 % EX CREA: 1.0000 "application " | TOPICAL_CREAM | Freq: Every day | CUTANEOUS | 0 refills | Status: DC

## 2020-09-07 NOTE — Patient Instructions (Signed)

## 2020-09-07 NOTE — Progress Notes (Addendum)
12/6/20213:37 PM  [name] Oct 19, 1985, 34 y.o., female [id number]  Chief Complaint  Patient presents with  . Rash    Left arm x 1 month    HPI:   Patient is a 34 y.o. female with past medical history significant for migraines who presents today for Left arm rash  Rash on left arm Denies changes in products Started 10/23 small dry path of skin Now on right side as well Left arm itches at times Tried: fungal cream, neosporin, aloe, Has not tried steroid cream or any oral medications This has never happened before Did get a new pet   Depression screen [facility] 2/9 06/10/2020 03/06/2020 11/27/2018  Decreased Interest 0 0 2  Down, Depressed, Hopeless 0 0 2  PHQ - 2 Score 0 0 4  Altered sleeping - - 2  Tired, decreased energy - - 2  Change in appetite - - 2  Feeling bad or failure about yourself  - - 2  Trouble concentrating - - 2  Moving slowly or fidgety/restless - - 2  Suicidal thoughts - - 0  PHQ-9 Score - - 16  Difficult doing work/chores - - Somewhat difficult    Fall Risk  06/10/2020 03/06/2020 02/21/2018 02/14/2018 01/25/2018  Falls in the past year? 0 0 No No No  Number falls in past yr: 0 0 - - -  Injury with Fall? 0 0 - - -  Follow up Falls evaluation completed Falls evaluation completed - - -     Allergies  Allergen Reactions  . Pollen Extract Itching    Prior to Admission medications   Medication Sig Start Date End Date Taking? Authorizing Provider  Fremanezumab-vfrm (AJOVY) 225 MG/1.5ML SOSY Inject 225 mg into the skin every 30 (thirty) days. 07/01/20  Yes Glean Salvo, NP  frovatriptan (FROVA) 2.5 MG tablet Take 1 tablet (2.5 mg total) by mouth as needed for migraine. If recurs, may repeat after 2 hours. Max of 2 tabs in 24 hours. 06/10/20  Yes Janeece Agee, NP  nortriptyline (PAMELOR) 50 MG capsule Take 1 capsule (50 mg total) by mouth at bedtime. 07/01/20  Yes Glean Salvo, NP  olopatadine (PATADAY) 0.1 % ophthalmic solution Place 1 drop into both eyes  2 (two) times daily. 12/14/19  Yes Darr, Gerilyn Pilgrim, PA-C  topiramate (TOPAMAX) 100 MG tablet Take 1 tablet (100 mg total) by mouth daily. 06/10/20  Yes Janeece Agee, NP    Past Medical History:  Diagnosis Date  . Allergy   . Common migraine with intractable migraine 03/16/2018  . Depression   . Gonorrhea   . Headache(784.0)   . Infection    UTI  . Shoulder dystocia, delivered, current hospitalization 06/14/2014  . Vaginal Pap smear, abnormal    f/u ok    Past Surgical History:  Procedure Laterality Date  . CESAREAN SECTION N/A    Phreesia 06/06/2020  . CESAREAN SECTION WITH BILATERAL TUBAL LIGATION Bilateral 07/20/2016   Procedure: REPEAT CESAREAN SECTION WITH BILATERAL TUBAL LIGATION;  Surgeon: Jaymes Graff, MD;  [facility]: [facility];  Service: Obstetrics;  Laterality: Bilateral;  Provider requests RNFA -ap    Social History   Tobacco Use  . Smoking status: Former Games developer  . Smokeless tobacco: Never Used  Substance Use Topics  . Alcohol use: Yes    Comment: occasionally    Family History  Problem Relation Age of Onset  . Healthy Mother   . Hearing loss Neg Hx     Review of Systems  Constitutional: Negative for chills, fever, malaise/fatigue and weight loss.  Respiratory: Negative for cough and shortness of breath.   Cardiovascular: Negative for chest pain.  Musculoskeletal: Negative for back pain, joint pain and myalgias.  Skin: Positive for itching and rash (Left arm).     OBJECTIVE:  Today's Vitals   09/07/20 1504  BP: 113/75  Pulse: 80  Temp: 98 F (36.7 C)  SpO2: 97%  Weight: 152 lb (68.9 kg)  Height: 5\' 1"  (1.549 m)   Body mass index is 28.72 kg/m.   Physical Exam Constitutional:      General: She is not in acute distress.    Appearance: Normal appearance. She is not ill-appearing.  Pulmonary:     Effort: Pulmonary effort is normal. No respiratory distress.  Skin:    Findings: Rash (Left arm and right side (see media tab)) present.   Neurological:     Mental Status: She is alert and oriented to person, place, and time.  Psychiatric:        Mood and Affect: Mood normal.     No results found for this or any previous visit (from the past 24 hour(s)).  No results found.   ASSESSMENT and PLAN  Problem List Items Addressed This Visit    None    Visit Diagnoses    Skin lesions    -  Primary   Relevant Medications   ketoconazole (NIZORAL) 2 % cream   TRIAMCINOLONE ACETONIDE, TOP, (TRITOCIN) 0.05 % OINT  R/se/b of medications discussed RTC precautions provided   Other Relevant Orders   Ambulatory referral to Dermatology F/u if no improvement seen      Return in about 2 months (around 11/08/2020) for rash follow up.   01/06/2021 Amandeep Hogston, FNP-BC Primary Care at [facility] [address] [place], [place] Ph.  [phone] Fax [phone]  I have reviewed and agree with above documentation. [phone], MD

## 2020-09-09 ENCOUNTER — Telehealth: Payer: Self-pay | Admitting: Dermatology

## 2020-09-09 NOTE — Telephone Encounter (Signed)
To: Master of All Referrals: Referral from [place]. Appt. 02/08/21 @9  w/ST

## 2020-10-14 ENCOUNTER — Ambulatory Visit: Payer: BC Managed Care – PPO | Admitting: Registered Nurse

## 2020-10-14 ENCOUNTER — Encounter: Payer: Self-pay | Admitting: Registered Nurse

## 2020-10-14 ENCOUNTER — Other Ambulatory Visit: Payer: Self-pay

## 2020-10-14 ENCOUNTER — Encounter: Payer: Self-pay | Admitting: Neurology

## 2020-10-14 VITALS — BP 127/83 | HR 72 | Temp 98.2°F | Ht 61.0 in | Wt 153.8 lb

## 2020-10-14 DIAGNOSIS — F331 Major depressive disorder, recurrent, moderate: Secondary | ICD-10-CM | POA: Diagnosis not present

## 2020-10-14 DIAGNOSIS — Z130 Encounter for screening for diseases of the blood and blood-forming organs and certain disorders involving the immune mechanism: Secondary | ICD-10-CM

## 2020-10-14 DIAGNOSIS — Z13220 Encounter for screening for lipoid disorders: Secondary | ICD-10-CM

## 2020-10-14 DIAGNOSIS — Z7689 Persons encountering health services in other specified circumstances: Secondary | ICD-10-CM | POA: Diagnosis not present

## 2020-10-14 DIAGNOSIS — Z1329 Encounter for screening for other suspected endocrine disorder: Secondary | ICD-10-CM | POA: Diagnosis not present

## 2020-10-14 DIAGNOSIS — Z13228 Encounter for screening for other metabolic disorders: Secondary | ICD-10-CM

## 2020-10-14 NOTE — Patient Instructions (Signed)
° ° ° °  If you have lab work done today you will be contacted with your lab results within the next 2 weeks.  If you have not heard from us then please contact us. The fastest way to get your results is to register for My Chart. ° ° °IF you received an x-ray today, you will receive an invoice from [facility]. Please contact [facility] at [phone] with questions or concerns regarding your invoice.  ° °IF you received labwork today, you will receive an invoice from [facility]. Please contact [facility] at [phone] with questions or concerns regarding your invoice.  ° °Our billing staff will not be able to assist you with questions regarding bills from these companies. ° °You will be contacted with the lab results as soon as they are available. The fastest way to get your results is to activate your My Chart account. Instructions are located on the last page of this paperwork. If you have not heard from us regarding the results in 2 weeks, please contact this office. °  ° ° ° °

## 2020-10-14 NOTE — Progress Notes (Signed)
Established Patient Office Visit  Subjective:  Patient ID: [name] [name], female    DOB: 1986/09/12  Age: 36 y.o. MRN: [id number]  CC:  Chief Complaint  Patient presents with  . Transitions Of Care    Rash is gone as well   . Depression    Wants a referral     HPI [name] [name] presents for visit to est care.  Rash resolved from last visit. No ongoing concerns.  Does note depression is up and down, worsening again. Original onset as postpartum after delivery of son in 2015 complicated by shoulder dystocia. She states she didn't feel that she had totally bounced back before her daughter's birth 2 years later, still feels like she isn't herself. States that her brother is on an antidepressant medication but unsure which one. She would like a referral to a specialist rather than starting medication today. Denies HI/SI  Otherwise feeling well.   Past Medical History:  Diagnosis Date  . Allergy   . Common migraine with intractable migraine 03/16/2018  . Depression   . Gonorrhea   . Headache(784.0)   . Infection    UTI  . Shoulder dystocia, delivered, current hospitalization 06/14/2014  . Vaginal Pap smear, abnormal    f/u ok    Past Surgical History:  Procedure Laterality Date  . CESAREAN SECTION N/A    Phreesia 06/06/2020  . CESAREAN SECTION WITH BILATERAL TUBAL LIGATION Bilateral 07/20/2016   Procedure: REPEAT CESAREAN SECTION WITH BILATERAL TUBAL LIGATION;  Surgeon: Jaymes Graff, MD;  Location: [facility];  Service: Obstetrics;  Laterality: Bilateral;  Provider requests RNFA -ap    Family History  Problem Relation Age of Onset  . Diabetes Mother   . Hearing loss Neg Hx     Social History   Socioeconomic History  . Marital status: Married    Spouse name: Not on file  . Number of children: 3  . Years of education: Not on file  . 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  Smoking status: Former Games developer  . Smokeless tobacco: Never Used  Vaping Use  . Vaping Use: Never used  Substance and Sexual Activity  . Alcohol use: Yes    Comment: occasionally  . Drug use: Yes    Types: Marijuana    Comment: occasionally  . Sexual activity: Yes    Birth control/protection: None  Other Topics Concern  . Not on file  Social History Narrative  . Not on file   Social Determinants of Health   Financial Resource Strain: Not on file  Food Insecurity: Not on file  Transportation Needs: Not on file  Physical Activity: Not on file  Stress: Not on file  Social Connections: Not on file  Intimate Partner Violence: Not on file    Outpatient Medications Prior to Visit  Medication Sig Dispense Refill  . Fremanezumab-vfrm (AJOVY) 225 MG/1.5ML SOSY Inject 225 mg into the skin every 30 (thirty) days. 1.5 mL 11  . frovatriptan (FROVA) 2.5 MG tablet Take 1 tablet (2.5 mg total) by mouth as needed for migraine. If recurs, may repeat after 2 hours. Max of 2 tabs in 24 hours. 10 tablet 4  . ketoconazole (NIZORAL) 2 % cream Apply 1 application topically daily. 15 g 0  . nortriptyline (PAMELOR) 50 MG capsule Take 1 capsule (50 mg total) by mouth at bedtime. 90 capsule 3  . topiramate (TOPAMAX) 100  MG tablet Take 1 tablet (100 mg total) by mouth daily. 30 tablet 4  . TRIAMCINOLONE ACETONIDE, TOP, (TRITOCIN) 0.05 % OINT Apply 1 application topically daily. 110 g 0  . olopatadine (PATADAY) 0.1 % ophthalmic solution Place 1 drop into both eyes 2 (two) times daily. 5 mL 12   No facility-administered medications prior to visit.    Allergies  Allergen Reactions  . Pollen Extract Itching    ROS Review of Systems  Constitutional: Negative.   HENT: Negative.   Eyes: Negative.   Respiratory: Negative.   Cardiovascular: Negative.   Gastrointestinal: Negative.   Genitourinary: Negative.   Musculoskeletal: Negative.   Skin: Negative.   Neurological: Negative.    Psychiatric/Behavioral: Negative.   All other systems reviewed and are negative.     Objective:    Physical Exam Vitals and nursing note reviewed.  Constitutional:      General: She is not in acute distress.    Appearance: Normal appearance. She is normal weight. She is not ill-appearing, toxic-appearing or diaphoretic.  Cardiovascular:     Rate and Rhythm: Normal rate and regular rhythm.     Heart sounds: Normal heart sounds. No murmur heard. No friction rub. No gallop.   Pulmonary:     Effort: Pulmonary effort is normal. No respiratory distress.     Breath sounds: Normal breath sounds. No stridor. No wheezing, rhonchi or rales.  Chest:     Chest wall: No tenderness.  Skin:    General: Skin is warm and dry.  Neurological:     General: No focal deficit present.     Mental Status: She is alert and oriented to person, place, and time. Mental status is at baseline.  Psychiatric:        Mood and Affect: Mood normal.        Behavior: Behavior normal.        Thought Content: Thought content normal.        Judgment: Judgment normal.     BP 127/83   Pulse 72   Temp 98.2 F (36.8 C) (Temporal)   Ht 5\' 1"  (1.549 m)   Wt 153 lb 12.8 oz (69.8 kg)   LMP 10/11/2020   SpO2 99%   BMI 29.06 kg/m  Wt Readings from Last 3 Encounters:  10/14/20 153 lb 12.8 oz (69.8 kg)  09/07/20 152 lb (68.9 kg)  07/01/20 154 lb 3.2 oz (69.9 kg)     Health Maintenance Due  Topic Date Due  . PAP SMEAR-Modifier  11/05/2016    There are no preventive care reminders to display for this patient.  No results found for: TSH Lab Results  Component Value Date   WBC 6.2 03/01/2020   HGB 14.9 03/01/2020   HCT 43.5 03/01/2020   MCV 86.3 03/01/2020   PLT 206 03/01/2020   Lab Results  Component Value Date   NA 141 03/01/2020   K 3.7 03/01/2020   CO2 23 03/01/2020   GLUCOSE 96 03/01/2020   BUN 12 03/01/2020   CREATININE 0.74 03/01/2020   BILITOT 1.0 03/01/2020   ALKPHOS 47 03/01/2020   AST  22 03/01/2020   ALT 25 03/01/2020   PROT 8.3 (H) 03/01/2020   ALBUMIN 4.4 03/01/2020   CALCIUM 10.2 03/01/2020   ANIONGAP 10 03/01/2020   No results found for: CHOL No results found for: HDL No results found for: [facility] Lab Results  Component Value Date   TRIG 127 03/01/2020   No results found for: CHOLHDL No results  found for: HGBA1C    Assessment & Plan:   Problem List Items Addressed This Visit   None   Visit Diagnoses    Encounter to establish care    -  Primary   Moderate episode of recurrent major depressive disorder [facility])       Relevant Orders   Ambulatory referral to Psychiatry   Ambulatory referral to Psychology   Screening for endocrine, metabolic and immunity disorder       Relevant Orders   Hemoglobin A1c   Comprehensive metabolic panel   CBC With Differential   TSH   Lipid screening       Relevant Orders   Lipid panel      No orders of the defined types were placed in this encounter.   Follow-up: No follow-ups on file.   PLAN  Histories reviewed, updated as warranted  Reviewed recent labs  Labs collected. Will follow up with the patient as warranted.  Referrals placed  Patient encouraged to call clinic with any questions, comments, or concerns.  Janeece Agee, NP

## 2020-10-15 LAB — COMPREHENSIVE METABOLIC PANEL
ALT: 20 IU/L (ref 0–32)
AST: 22 IU/L (ref 0–40)
Albumin/Globulin Ratio: 1.7 (ref 1.2–2.2)
Albumin: 4.7 g/dL (ref 3.8–4.8)
Alkaline Phosphatase: 54 IU/L (ref 44–121)
BUN/Creatinine Ratio: 13 (ref 9–23)
BUN: 11 mg/dL (ref 6–20)
Bilirubin Total: 0.4 mg/dL (ref 0.0–1.2)
CO2: 23 mmol/L (ref 20–29)
Calcium: 9.8 mg/dL (ref 8.7–10.2)
Chloride: 104 mmol/L (ref 96–106)
Creatinine, Ser: 0.84 mg/dL (ref 0.57–1.00)
GFR calc Af Amer: 103 mL/min/{1.73_m2} (ref >59)
GFR calc non Af Amer: 90 mL/min/{1.73_m2} (ref >59)
Globulin, Total: 2.8 g/dL (ref 1.5–4.5)
Glucose: 107 mg/dL — ABNORMAL HIGH (ref 65–99)
Potassium: 4.3 mmol/L (ref 3.5–5.2)
Sodium: 141 mmol/L (ref 134–144)
Total Protein: 7.5 g/dL (ref 6.0–8.5)

## 2020-10-15 LAB — CBC WITH DIFFERENTIAL
Basophils Absolute: 0.1 10*3/uL (ref 0.0–0.2)
Basos: 1 %
EOS (ABSOLUTE): 0.2 10*3/uL (ref 0.0–0.4)
Eos: 5 %
Hematocrit: 39.7 % (ref 34.0–46.6)
Hemoglobin: 13.3 g/dL (ref 11.1–15.9)
Immature Grans (Abs): 0 10*3/uL (ref 0.0–0.1)
Immature Granulocytes: 0 %
Lymphocytes Absolute: 2 10*3/uL (ref 0.7–3.1)
Lymphs: 40 %
MCH: 28.9 pg (ref 26.6–33.0)
MCHC: 33.5 g/dL (ref 31.5–35.7)
MCV: 86 fL (ref 79–97)
Monocytes Absolute: 0.4 10*3/uL (ref 0.1–0.9)
Monocytes: 8 %
Neutrophils Absolute: 2.3 10*3/uL (ref 1.4–7.0)
Neutrophils: 46 %
RBC: 4.6 x10E6/uL (ref 3.77–5.28)
RDW: 12.4 % (ref 11.7–15.4)
WBC: 5.1 10*3/uL (ref 3.4–10.8)

## 2020-10-15 LAB — LIPID PANEL
Chol/HDL Ratio: 4.5 ratio — ABNORMAL HIGH (ref 0.0–4.4)
Cholesterol, Total: 178 mg/dL (ref 100–199)
HDL: 40 mg/dL (ref >39)
LDL Chol Calc (NIH): 127 mg/dL — ABNORMAL HIGH (ref 0–99)
Triglycerides: 58 mg/dL (ref 0–149)
VLDL Cholesterol Cal: 11 mg/dL (ref 5–40)

## 2020-10-15 LAB — TSH: TSH: 2.37 u[IU]/mL (ref 0.450–4.500)

## 2020-10-15 LAB — HEMOGLOBIN A1C
Est. average glucose Bld gHb Est-mCnc: 103 mg/dL
Hgb A1c MFr Bld: 5.2 % (ref 4.8–5.6)

## 2020-10-22 NOTE — Telephone Encounter (Signed)
FMLA form [employer] completed, to SS/NP to sign.

## 2020-11-06 ENCOUNTER — Encounter: Payer: Self-pay | Admitting: Neurology

## 2020-11-09 ENCOUNTER — Telehealth: Payer: Self-pay | Admitting: Neurology

## 2020-11-09 NOTE — Telephone Encounter (Signed)
addended to chronic condition and refaxed to [phone], Case [id number].

## 2020-11-09 NOTE — Telephone Encounter (Signed)
FMLA paperwork faxed to [phone]

## 2020-11-30 ENCOUNTER — Other Ambulatory Visit: Payer: BC Managed Care – PPO

## 2020-11-30 DIAGNOSIS — Z20822 Contact with and (suspected) exposure to covid-19: Secondary | ICD-10-CM

## 2020-12-01 LAB — SARS-COV-2, NAA 2 DAY TAT

## 2020-12-01 LAB — NOVEL CORONAVIRUS, NAA: SARS-CoV-2, NAA: NOT DETECTED

## 2020-12-11 ENCOUNTER — Encounter: Payer: Self-pay | Admitting: Registered Nurse

## 2020-12-11 ENCOUNTER — Telehealth (INDEPENDENT_AMBULATORY_CARE_PROVIDER_SITE_OTHER): Payer: BC Managed Care – PPO | Admitting: Registered Nurse

## 2020-12-11 ENCOUNTER — Other Ambulatory Visit: Payer: Self-pay

## 2020-12-11 DIAGNOSIS — B9689 Other specified bacterial agents as the cause of diseases classified elsewhere: Secondary | ICD-10-CM

## 2020-12-11 DIAGNOSIS — J019 Acute sinusitis, unspecified: Secondary | ICD-10-CM | POA: Diagnosis not present

## 2020-12-11 MED ORDER — AZELASTINE HCL 0.1 % NA SOLN: 1.0000 | Freq: Two times a day (BID) | NASAL | 12 refills | Status: DC

## 2020-12-11 MED ORDER — PREDNISONE 20 MG PO TABS: 20.0000 mg | ORAL_TABLET | Freq: Every day | ORAL | 0 refills | Status: DC

## 2020-12-11 MED ORDER — AMOXICILLIN-POT CLAVULANATE 875-125 MG PO TABS: 1.0000 | ORAL_TABLET | Freq: Two times a day (BID) | ORAL | 0 refills | Status: DC

## 2020-12-11 NOTE — Progress Notes (Signed)
Telemedicine Encounter- SOAP NOTE Established Patient  This telephone encounter was conducted with the patient's (or proxy's) verbal consent via audio telecommunications: yes  Patient was instructed to have this encounter in a suitably private space; and to only have persons present to whom they give permission to participate. In addition, patient identity was confirmed by use of name plus two identifiers (DOB and address).  I discussed the limitations, risks, security and privacy concerns of performing an evaluation and management service by telephone and the availability of in person appointments. I also discussed with the patient that there may be a patient responsible charge related to this service. The patient expressed understanding and agreed to proceed.  I spent a total of 15 minutes talking with the patient or their proxy.  Patient at home Provider in office  Chief Complaint  Patient presents with  . Cough    Patient states she has been experiencing Nasal congestion, constant cough, sneezing , and the roof of her mouth feels like it has been cut. She has had a negative covid test last week and has been taking OTC medications and home remedies with no relief.     Subjective   [name] [name] is a 35 y.o. established patient. Telephone visit today for upper respiratory symptoms  HPI Ongoing 10 days covid test negative No sick contacts Sinus pressure, facial headache Pnd, cough Dysphagia and sore throat No shob doe or lower respiratory symptoms Otherwise no concerns.  Patient Active Problem List   Diagnosis Date Noted  . Common migraine without intractability 10/04/2018  . Common migraine with intractable migraine 03/16/2018  . Hx of tubal ligation 10/11/2017  . 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 S/P primary low transverse C-section 07/20/2016  . S/P cesarean section 07/20/2016  . History of miscarriage 07/20/2013     Past Medical History:  Diagnosis Date  . Allergy   . Common migraine with intractable migraine 03/16/2018  . Depression   . Gonorrhea   . Headache(784.0)   . Infection    UTI  . Shoulder dystocia, delivered, current hospitalization 06/14/2014  . Vaginal Pap smear, abnormal    f/u ok    Current Outpatient Medications  Medication Sig Dispense Refill  . amoxicillin-clavulanate (AUGMENTIN) 875-125 MG tablet Take 1 tablet by mouth 2 (two) times daily. 14 tablet 0  . azelastine (ASTELIN) 0.1 % nasal spray Place 1 spray into both nostrils 2 (two) times daily. Use in each nostril as directed 30 mL 12  . Fremanezumab-vfrm (AJOVY) 225 MG/1.5ML SOSY Inject 225 mg into the skin every 30 (thirty) days. 1.5 mL 11  . frovatriptan (FROVA) 2.5 MG tablet Take 1 tablet (2.5 mg total) by mouth as needed for migraine. If recurs, may repeat after 2 hours. Max of 2 tabs in 24 hours. 10 tablet 4  . ketoconazole (NIZORAL) 2 % cream Apply 1 application topically daily. 15 g 0  . nortriptyline (PAMELOR) 50 MG capsule Take 1 capsule (50 mg total) by mouth at bedtime. 90 capsule 3  . predniSONE (DELTASONE) 20 MG tablet Take 1 tablet (20 mg total) by mouth daily with breakfast. 7 tablet 0  . topiramate (TOPAMAX) 100 MG tablet Take 1 tablet (100 mg total) by mouth daily. 30 tablet 4  . TRIAMCINOLONE ACETONIDE, TOP, (TRITOCIN) 0.05 % OINT Apply 1 application topically daily. 110 g 0   No current facility-administered medications for this visit.    Allergies  Allergen Reactions  .  Pollen Extract Itching    Social History   Socioeconomic History  . Marital status: Married    Spouse name: Not on file  . Number of children: 3  . Years of education: Not on file  . 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 Smoking status: Former Games developer  . Smokeless tobacco: Never Used  Vaping Use  . Vaping Use: Never used  Substance and Sexual  Activity  . Alcohol use: Yes    Comment: occasionally  . Drug use: Yes    Types: Marijuana    Comment: occasionally  . Sexual activity: Yes    Birth control/protection: None  Other Topics Concern  . Not on file  Social History Narrative  . Not on file   Social Determinants of Health   Financial Resource Strain: Not on file  Food Insecurity: Not on file  Transportation Needs: Not on file  Physical Activity: Not on file  Stress: Not on file  Social Connections: Not on file  Intimate Partner Violence: Not on file    ROS Per hpi   Objective   Vitals as reported by the patient: There were no vitals filed for this visit.  [name] was seen today for cough.  Diagnoses and all orders for this visit:  Acute bacterial sinusitis -     amoxicillin-clavulanate (AUGMENTIN) 875-125 MG tablet; Take 1 tablet by mouth 2 (two) times daily. -     azelastine (ASTELIN) 0.1 % nasal spray; Place 1 spray into both nostrils 2 (two) times daily. Use in each nostril as directed -     predniSONE (DELTASONE) 20 MG tablet; Take 1 tablet (20 mg total) by mouth daily with breakfast.   PLAN  augmentin po bid 7 days  Prednisone  Azelastine   Return if worsening or failing to improve  Suggest continued OTCs prn  Patient encouraged to call clinic with any questions, comments, or concerns.   I discussed the assessment and treatment plan with the patient. The patient was provided an opportunity to ask questions and all were answered. The patient agreed with the plan and demonstrated an understanding of the instructions.   The patient was advised to call back or seek an in-person evaluation if the symptoms worsen or if the condition fails to improve as anticipated.  I provided 15 minutes of non-face-to-face time during this encounter.  Janeece Agee, NP  Primary Care at [facility]

## 2020-12-11 NOTE — Patient Instructions (Signed)
° ° ° °  If you have lab work done today you will be contacted with your lab results within the next 2 weeks.  If you have not heard from us then please contact us. The fastest way to get your results is to register for My Chart. ° ° °IF you received an x-ray today, you will receive an invoice from [facility]. Please contact [facility] at [phone] with questions or concerns regarding your invoice.  ° °IF you received labwork today, you will receive an invoice from [facility]. Please contact [facility] at [phone] with questions or concerns regarding your invoice.  ° °Our billing staff will not be able to assist you with questions regarding bills from these companies. ° °You will be contacted with the lab results as soon as they are available. The fastest way to get your results is to activate your My Chart account. Instructions are located on the last page of this paperwork. If you have not heard from us regarding the results in 2 weeks, please contact this office. °  ° ° ° °

## 2020-12-23 ENCOUNTER — Encounter: Payer: Self-pay | Admitting: Registered Nurse

## 2020-12-30 NOTE — Telephone Encounter (Signed)
Did you finish these papers and would you know where they are I know this was prior to the move.  Please advise

## 2021-01-04 NOTE — Telephone Encounter (Signed)
Have you seen this Patients Paperwork?

## 2021-01-14 NOTE — Telephone Encounter (Signed)
I think you have the paperwork for the patient , but if not I can get a copy to restart the process. Please Advise.

## 2021-01-18 NOTE — Telephone Encounter (Signed)
I think I'd already filled this out and returned it  Thanks  [employer]

## 2021-01-18 NOTE — Telephone Encounter (Signed)
Attempted to call patient to let her know that the paperwork has been sent and faxed .

## 2021-01-20 NOTE — Telephone Encounter (Signed)
Attempted to call patient to let her know paperwork was completed and faxed. Left a detailed message.

## 2021-02-08 ENCOUNTER — Ambulatory Visit: Payer: BC Managed Care – PPO | Admitting: Dermatology

## 2021-02-18 ENCOUNTER — Encounter: Payer: Self-pay | Admitting: Registered Nurse

## 2021-02-19 ENCOUNTER — Encounter: Payer: Self-pay | Admitting: Medical

## 2021-02-19 ENCOUNTER — Other Ambulatory Visit: Payer: Self-pay

## 2021-02-19 ENCOUNTER — Telehealth: Payer: BC Managed Care – PPO | Admitting: Internal Medicine

## 2021-02-19 ENCOUNTER — Telehealth (INDEPENDENT_AMBULATORY_CARE_PROVIDER_SITE_OTHER): Payer: BC Managed Care – PPO | Admitting: Medical

## 2021-02-19 DIAGNOSIS — J309 Allergic rhinitis, unspecified: Secondary | ICD-10-CM | POA: Diagnosis not present

## 2021-02-19 DIAGNOSIS — J019 Acute sinusitis, unspecified: Secondary | ICD-10-CM | POA: Diagnosis not present

## 2021-02-19 DIAGNOSIS — B9689 Other specified bacterial agents as the cause of diseases classified elsewhere: Secondary | ICD-10-CM | POA: Diagnosis not present

## 2021-02-19 MED ORDER — PREDNISONE 10 MG (21) PO TBPK: ORAL_TABLET | ORAL | 0 refills | Status: DC

## 2021-02-19 MED ORDER — BENZONATATE 100 MG PO CAPS: 100.0000 mg | ORAL_CAPSULE | Freq: Three times a day (TID) | ORAL | 0 refills | Status: DC | PRN

## 2021-02-19 MED ORDER — AZITHROMYCIN 250 MG PO TABS: ORAL_TABLET | ORAL | 0 refills | Status: AC

## 2021-02-19 NOTE — Progress Notes (Signed)
   Subjective:    Patient ID: [name] [name], female    DOB: [birth year], 35 y.o.   MRN: [id number]  HPI  Virtual Visit via Video Note  I connected with [name] [name] on 02/19/21 at  9:20 AM EDT by a video enabled telemedicine application and verified that I am speaking with the correct person using two identifiers.  Location: Patient: home Provider: office   I discussed the limitations of evaluation and management by telemedicine and the availability of in person appointments. The patient expressed understanding and agreed to proceed.  History of Present Illness: Pt has st for 2 weeks, hoarse voice, dry cough that interrupts her sleep.  Pt states initially thought she had allergies as she does have hx of that before.  Pt states is little yellow productive cough.  States at onset was very nasal congested. Had green mucus when she blew her nose.  Pt has tried flonase, tylenol and benedry and zyrtec.   Some occasional sob after long cough episodes. No hx of asthma. Her son has hx of asthma.   Hx of severe cough at night. In past given inhalers.   No diabete.  lmp- tubal ligation.Just about due for menses.  Pt vaccinated against covid. Pt never tested herself yet. Hx of covid in the past.     Observations/Objective:  General-no acute distress, pleasant, oriented. Lungs- on inspection lungs appear unlabored. Neck- no tracheal deviation or jvd on inspection. Neuro- gross motor function appears intact. HEENT- frontal and maxillary sinus tenderness on self palpation.   Assessment and Plan: Recent 2-week approximate onset of allergy signs and symptoms as described in HPI.  Recent maxillary and frontal sinus pressure as well.     Prescribed astelin nasal spray, Flonase, benzonatate cough tablets, taper prednisone and azithromycin antibiotic.  Continue Zyrtec.  If signs and symptoms not improving with above measures then recommend doing COVID test.  Follow-up in 7 days  or as needed.  Esperanza Richters, PA-C   Follow Up Instructions:    I discussed the assessment and treatment plan with the patient. The patient was provided an opportunity to ask questions and all were answered. The patient agreed with the plan and demonstrated an understanding of the instructions.   The patient was advised to call back or seek an in-person evaluation if the symptoms worsen or if the condition fails to improve as anticipated.  Time spent with patient today was 32  minutes which consisted of chart revdiew, discussing diagnosis, work up treatment and documentation.   Esperanza Richters, PA-C   Review of Systems     Objective:   Physical Exam        Assessment & Plan:

## 2021-02-19 NOTE — Telephone Encounter (Signed)
Spoke with patient and let her know that the paperwork was faxed on 01/07/2021. And to call and let me know if she needed me to resend it again after she talk with her Oncologist office.

## 2021-02-19 NOTE — Patient Instructions (Addendum)
Recent 2-week approximate onset of allergy signs and symptoms as described in HPI.  Recent maxillary and frontal sinus pressure as well.     Prescribed astelin nasal spray, Flonase, benzonatate cough tablets, taper prednisone and azithromycin antibiotic.  Continue Zyrtec.  If signs and symptoms not improving with above measures then recommend doing COVID test.  Follow-up in 7 days or as needed.

## 2021-02-22 NOTE — Telephone Encounter (Signed)
I have printed this patient paperwork off and will get it faxed back today.

## 2021-02-25 ENCOUNTER — Encounter: Payer: Self-pay | Admitting: Registered Nurse

## 2021-02-25 ENCOUNTER — Other Ambulatory Visit: Payer: Self-pay

## 2021-02-25 ENCOUNTER — Telehealth (INDEPENDENT_AMBULATORY_CARE_PROVIDER_SITE_OTHER): Payer: BC Managed Care – PPO | Admitting: Registered Nurse

## 2021-02-25 VITALS — Resp 18 | Ht 61.0 in | Wt 153.0 lb

## 2021-02-25 DIAGNOSIS — J019 Acute sinusitis, unspecified: Secondary | ICD-10-CM | POA: Diagnosis not present

## 2021-02-25 DIAGNOSIS — B9689 Other specified bacterial agents as the cause of diseases classified elsewhere: Secondary | ICD-10-CM | POA: Diagnosis not present

## 2021-02-25 MED ORDER — LEVOFLOXACIN 500 MG PO TABS: 500.0000 mg | ORAL_TABLET | Freq: Every day | ORAL | 0 refills | Status: AC

## 2021-02-25 NOTE — Progress Notes (Signed)
Telemedicine Encounter- SOAP NOTE Established Patient  This telephone encounter was conducted with the patient's (or proxy's) verbal consent via audio telecommunications: yes  Patient was instructed to have this encounter in a suitably private space; and to only have persons present to whom they give permission to participate. In addition, patient identity was confirmed by use of name plus two identifiers (DOB and address).  I discussed the limitations, risks, security and privacy concerns of performing an evaluation and management service by telephone and the availability of in person appointments. I also discussed with the patient that there may be a patient responsible charge related to this service. The patient expressed understanding and agreed to proceed.  I spent a total of 15 minutes talking with the patient or their proxy.  Patient at home  Provider in office  Participants: [name], NP and Lysle Dingwall  Chief Complaint  Patient presents with  . Allergies    Patient states she is having the same sinus problems and wanted to discuss a leave.    Subjective   [name] [name] is a 36 y.o. established patient. Telephone visit today for ongoing sinus Congestion  HPI Has been intermittent over past 6 mo Has completed 2 courses of abx Has tried prednisone taper, antihistamines, and supportive care Symptoms continue to recur. Gets some relief from supportive care. Unfortunately having PND  Patient Active Problem List   Diagnosis Date Noted  . Common migraine without intractability 10/04/2018  . Common migraine with intractable migraine 03/16/2018  . Hx of tubal ligation 10/11/2017  . 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 S/P primary low transverse C-section 07/20/2016  . S/P cesarean section 07/20/2016  . History of miscarriage 07/20/2013    Past Medical History:  Diagnosis Date  . Allergy   . Common migraine with  intractable migraine 03/16/2018  . Depression   . Gonorrhea   . Headache(784.0)   . Infection    UTI  . Shoulder dystocia, delivered, current hospitalization 06/14/2014  . Vaginal Pap smear, abnormal    f/u ok    Current Outpatient Medications  Medication Sig Dispense Refill  . azelastine (ASTELIN) 0.1 % nasal spray Place 1 spray into both nostrils 2 (two) times daily. Use in each nostril as directed 30 mL 12  . benzonatate (TESSALON) 100 MG capsule Take 1 capsule (100 mg total) by mouth 3 (three) times daily as needed for cough. 30 capsule 0  . Fremanezumab-vfrm (AJOVY) 225 MG/1.5ML SOSY Inject 225 mg into the skin every 30 (thirty) days. 1.5 mL 11  . frovatriptan (FROVA) 2.5 MG tablet Take 1 tablet (2.5 mg total) by mouth as needed for migraine. If recurs, may repeat after 2 hours. Max of 2 tabs in 24 hours. 10 tablet 4  . ketoconazole (NIZORAL) 2 % cream Apply 1 application topically daily. 15 g 0  . levofloxacin (LEVAQUIN) 500 MG tablet Take 1 tablet (500 mg total) by mouth daily for 7 days. 7 tablet 0  . nortriptyline (PAMELOR) 50 MG capsule Take 1 capsule (50 mg total) by mouth at bedtime. 90 capsule 3  . predniSONE (STERAPRED UNI-PAK 21 TAB) 10 MG (21) TBPK tablet Standard Taper over 6 days. 21 tablet 0  . topiramate (TOPAMAX) 100 MG tablet Take 1 tablet (100 mg total) by mouth daily. 30 tablet 4  . TRIAMCINOLONE ACETONIDE, TOP, (TRITOCIN) 0.05 % OINT Apply 1 application topically daily. 110 g 0  . amoxicillin-clavulanate (AUGMENTIN) 875-125 MG tablet  Take 1 tablet by mouth 2 (two) times daily. 14 tablet 0   No current facility-administered medications for this visit.    Allergies  Allergen Reactions  . Pollen Extract Itching    Social History   Socioeconomic History  . Marital status: Married    Spouse name: Not on file  . Number of children: 3  . Years of education: Not on file  . 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 Smoking status: Former Games developer  . Smokeless tobacco: Never Used  Vaping Use  . Vaping Use: Never used  Substance and Sexual Activity  . Alcohol use: Yes    Comment: occasionally  . Drug use: Yes    Types: Marijuana    Comment: occasionally  . Sexual activity: Yes    Birth control/protection: None  Other Topics Concern  . Not on file  Social History Narrative  . Not on file   Social Determinants of Health   Financial Resource Strain: Not on file  Food Insecurity: Not on file  Transportation Needs: Not on file  Physical Activity: Not on file  Stress: Not on file  Social Connections: Not on file  Intimate Partner Violence: Not on file    Review of Systems  Constitutional: Negative.   HENT: Positive for congestion, sinus pain and sore throat.   Eyes: Negative.   Respiratory: Negative.   Cardiovascular: Negative.   Gastrointestinal: Negative.   Genitourinary: Negative.   Musculoskeletal: Negative.   Skin: Negative.   Neurological: Negative.   Endo/Heme/Allergies: Negative.   Psychiatric/Behavioral: Negative.   All other systems reviewed and are negative.   Objective   Vitals as reported by the patient: Today's Vitals   02/25/21 1204  Resp: 18  Weight: 153 lb (69.4 kg)  Height: 5\' 1"  (1.549 m)    Long was seen today for allergies.  Diagnoses and all orders for this visit:  Acute bacterial sinusitis -     levofloxacin (LEVAQUIN) 500 MG tablet; Take 1 tablet (500 mg total) by mouth daily for 7 days.    PLAN  levaquin 500mg  PO qd for one week  Continue supportive care  Discussed r/b/se  Given persistence of infection, will refer to ENT  Amenable to FMLA intermittent leave for recurrence of sinusitis. Plan for 4-5 days once every 1-2 months, with expected visits with specialists and primary care interspersed.   Patient encouraged to call clinic with any questions, comments, or concerns.   I discussed the assessment  and treatment plan with the patient. The patient was provided an opportunity to ask questions and all were answered. The patient agreed with the plan and demonstrated an understanding of the instructions.   The patient was advised to call back or seek an in-person evaluation if the symptoms worsen or if the condition fails to improve as anticipated.  I provided 15 minutes of non-face-to-face time during this encounter.  Terrace Arabia, NP  Primary Care at [facility]

## 2021-02-25 NOTE — Patient Instructions (Signed)
° ° ° °  If you have lab work done today you will be contacted with your lab results within the next 2 weeks.  If you have not heard from us then please contact us. The fastest way to get your results is to register for My Chart. ° ° °IF you received an x-ray today, you will receive an invoice from [facility]. Please contact [facility] at [phone] with questions or concerns regarding your invoice.  ° °IF you received labwork today, you will receive an invoice from [facility]. Please contact [facility] at [phone] with questions or concerns regarding your invoice.  ° °Our billing staff will not be able to assist you with questions regarding bills from these companies. ° °You will be contacted with the lab results as soon as they are available. The fastest way to get your results is to activate your My Chart account. Instructions are located on the last page of this paperwork. If you have not heard from us regarding the results in 2 weeks, please contact this office. °  ° ° ° °

## 2021-02-25 NOTE — Telephone Encounter (Signed)
Patient paperwork has been printed and put in [employer].

## 2021-03-03 NOTE — Telephone Encounter (Signed)
Have you seen this patients paperwork.

## 2021-03-03 NOTE — Telephone Encounter (Signed)
Patient is calling in regard to a status update.  Patient can be reached at [phone].

## 2021-03-04 NOTE — Telephone Encounter (Signed)
fmla forms faxed to [phone] and I have sent them to scan.

## 2021-07-01 ENCOUNTER — Ambulatory Visit: Payer: BC Managed Care – PPO | Admitting: Neurology

## 2021-07-01 ENCOUNTER — Telehealth: Payer: Self-pay | Admitting: *Deleted

## 2021-07-01 ENCOUNTER — Encounter: Payer: Self-pay | Admitting: Neurology

## 2021-07-01 ENCOUNTER — Other Ambulatory Visit: Payer: Self-pay

## 2021-07-01 VITALS — BP 123/76 | HR 93 | Ht 61.0 in | Wt 161.8 lb

## 2021-07-01 DIAGNOSIS — G43019 Migraine without aura, intractable, without status migrainosus: Secondary | ICD-10-CM

## 2021-07-01 MED ORDER — TOPIRAMATE 50 MG PO TABS: 150.0000 mg | ORAL_TABLET | Freq: Every day | ORAL | 1 refills | Status: DC

## 2021-07-01 MED ORDER — PREDNISONE 10 MG PO TABS: ORAL_TABLET | ORAL | 0 refills | Status: DC

## 2021-07-01 MED ORDER — DICLOFENAC POTASSIUM 50 MG PO TABS: 50.0000 mg | ORAL_TABLET | Freq: Three times a day (TID) | ORAL | 2 refills | Status: DC | PRN

## 2021-07-01 NOTE — Telephone Encounter (Signed)
Received [employer]. Called patient, LVM requesting call back. She had mentioned during visit that she has missed 3 weeks of work due to migraines. I requested she call back or send my chart giving the dates she was or will be out so paperwork can be completed.

## 2021-07-01 NOTE — Progress Notes (Signed)
Reason for visit: Migraine headache  [name] [name] is an 35 y.o. female  History of present illness:  [name] [name] is a 35 year old right-handed female with a history of intractable migraine headache.  She had done quite a bit better on Ajovy, she was also on nortriptyline and Topamax.  She stopped the nortriptyline at a 50 mg dose in August 2022.  Within a few weeks, she began developing fairly severe bifrontal headaches that are essentially daily at this point.  The patient indicates that the headaches are in the same location as her usual migraines but are more severe and associated with severe photophobia.  The patient does not have nausea or vomiting but she may have some queasiness.  The headaches are throbbing in nature.  She does have some sinus drainage at times, this is a recent issue.  She does have a history of allergies.  She did have a sinus infection back in May 2022.  She has not been able to go to work since 14 June 2021.  Past Medical History:  Diagnosis Date   Allergy    Common migraine with intractable migraine 03/16/2018   Depression    Gonorrhea    Headache(784.0)    Infection    UTI   Shoulder dystocia, delivered, current hospitalization 06/14/2014   Vaginal Pap smear, abnormal    f/u ok    Past Surgical History:  Procedure Laterality Date   CESAREAN SECTION N/A    [employer] 06/06/2020   CESAREAN SECTION WITH BILATERAL TUBAL LIGATION Bilateral 07/20/2016   Procedure: REPEAT CESAREAN SECTION WITH BILATERAL TUBAL LIGATION;  Surgeon: Jaymes Graff, MD;  Location: [facility];  Service: Obstetrics;  Laterality: Bilateral;  Provider requests RNFA -ap    Family History  Problem Relation Age of Onset   Diabetes Mother    Hearing loss Neg Hx     Social history:  reports that she has quit smoking. She has never used smokeless tobacco. She reports current alcohol use. She reports current drug use. Drug: Marijuana.    Allergies  Allergen Reactions    Pollen Extract Itching    Medications:  Prior to Admission medications   Medication Sig Start Date End Date Taking? Authorizing Provider  Fremanezumab-vfrm (AJOVY) 225 MG/1.5ML SOSY Inject 225 mg into the skin every 30 (thirty) days. 07/01/20  Yes Glean Salvo, NP  frovatriptan (FROVA) 2.5 MG tablet Take 1 tablet (2.5 mg total) by mouth as needed for migraine. If recurs, may repeat after 2 hours. Max of 2 tabs in 24 hours. 06/10/20  Yes Janeece Agee, NP  ketoconazole (NIZORAL) 2 % cream Apply 1 application topically daily. 09/07/20  Yes Just, Azalee Course, FNP  nortriptyline (PAMELOR) 50 MG capsule Take 1 capsule (50 mg total) by mouth at bedtime. 07/01/20  Yes Glean Salvo, NP  topiramate (TOPAMAX) 100 MG tablet Take 1 tablet (100 mg total) by mouth daily. 06/10/20  Yes Janeece Agee, NP  TRIAMCINOLONE ACETONIDE, TOP, (TRITOCIN) 0.05 % OINT Apply 1 application topically daily. 09/07/20  Yes Just, Azalee Course, FNP    ROS:  Out of a complete 14 system review of symptoms, the patient complains only of the following symptoms, and all other reviewed systems are negative.  Migraine headache  Blood pressure 123/76, pulse 93, height 5\' 1"  (1.549 m), weight 161 lb 12.8 oz (73.4 kg).  Physical Exam  General: The patient is alert and cooperative at the time of the examination.  Skin: No significant peripheral edema is  noted.   Neurologic Exam  Mental status: The patient is alert and oriented x 3 at the time of the examination. The patient has apparent normal recent and remote memory, with an apparently normal attention span and concentration ability.   Cranial nerves: Facial symmetry is present. Speech is normal, no aphasia or dysarthria is noted. Extraocular movements are full. Visual fields are full.  Motor: The patient has good strength in all 4 extremities.  Sensory examination: Soft touch sensation is symmetric on the face, arms, and legs.  Coordination: The patient has good  finger-nose-finger and heel-to-shin bilaterally.  Gait and station: The patient has a normal gait. Tandem gait is normal. Romberg is negative. No drift is seen.  Reflexes: Deep tendon reflexes are symmetric.   Assessment/Plan:  1.  Intractable migraine  The patient has had significant issues with headaches.  It is possible that withdrawal from the nortriptyline may have induced these more recent headaches.  She cannot tolerate nortriptyline due to excessive drowsiness.  We will increase the Topamax to 150 mg in the evening, we will give her a 6-day prednisone taper, I will give her diclofenac potassium to take if needed.  She may be able to take this more often than the Frova.  The patient will follow-up in 4 months.  If she is not getting better over the next couple weeks, she will call our office and we will consider MRI of the brain.  In the future, she can be followed through Dr. Delena Bali.  Marlan Palau MD 07/01/2021 1:32 PM  [facility] [address] [place], [place] [address]  Phone [phone] Fax [phone]

## 2021-07-01 NOTE — Patient Instructions (Signed)
We will go up on the Topamax to 150 mg at night.  Topamax (topiramate) is a seizure medication that has an FDA approval for seizures and for migraine headache. Potential side effects of this medication include weight loss, cognitive slowing, tingling in the fingers and toes, and carbonated drinks will taste bad. If any significant side effects are noted on this drug, please contact our office.  

## 2021-07-01 NOTE — Telephone Encounter (Signed)
Patient returned call. Her absence from work began 06/14/21 and she will resume work on 07/05/21. FMLA papers on Dr Anne Hahn' desk for review, completion, signature.

## 2021-07-05 NOTE — Telephone Encounter (Signed)
FMLA papers completed, signed and sent to Medical records for processing.  °

## 2021-07-07 NOTE — Telephone Encounter (Signed)
Received my chart, FMLA dates need to be changed to 06/25/21 - 07/05/21. Changes made and FMLA faxed to [employer]. Received confirmation.

## 2021-07-08 NOTE — Telephone Encounter (Signed)
Received my chart stating that questions #4, 10 need clarification, must be updated, initialed and dated by provider. Placed on Dr Anne Hahn desk. He will be in office Monday, form is due Tues.

## 2021-07-12 ENCOUNTER — Encounter: Payer: Self-pay | Admitting: *Deleted

## 2021-07-12 NOTE — Telephone Encounter (Signed)
FMLA form was signed and initialed.

## 2021-07-12 NOTE — Telephone Encounter (Addendum)
FMLA faxed to [employer], received confirmation. Sent my chart to advise patient.  Copy sent to medical records for scanning.

## 2021-08-13 ENCOUNTER — Telehealth (INDEPENDENT_AMBULATORY_CARE_PROVIDER_SITE_OTHER): Payer: BC Managed Care – PPO | Admitting: Registered Nurse

## 2021-08-13 ENCOUNTER — Encounter: Payer: Self-pay | Admitting: Registered Nurse

## 2021-08-13 ENCOUNTER — Other Ambulatory Visit: Payer: Self-pay

## 2021-08-13 VITALS — Wt 160.0 lb

## 2021-08-13 DIAGNOSIS — R051 Acute cough: Secondary | ICD-10-CM | POA: Diagnosis not present

## 2021-08-13 DIAGNOSIS — R6889 Other general symptoms and signs: Secondary | ICD-10-CM | POA: Diagnosis not present

## 2021-08-13 DIAGNOSIS — Z20828 Contact with and (suspected) exposure to other viral communicable diseases: Secondary | ICD-10-CM | POA: Diagnosis not present

## 2021-08-13 MED ORDER — BENZONATATE 100 MG PO CAPS: 100.0000 mg | ORAL_CAPSULE | Freq: Three times a day (TID) | ORAL | 0 refills | Status: DC | PRN

## 2021-08-13 MED ORDER — OSELTAMIVIR PHOSPHATE 75 MG PO CAPS: 75.0000 mg | ORAL_CAPSULE | Freq: Two times a day (BID) | ORAL | 0 refills | Status: DC

## 2021-08-13 MED ORDER — DM-GUAIFENESIN ER 30-600 MG PO TB12: 1.0000 | ORAL_TABLET | Freq: Two times a day (BID) | ORAL | 0 refills | Status: DC

## 2021-08-13 NOTE — Patient Instructions (Signed)
° ° ° °  If you have lab work done today you will be contacted with your lab results within the next 2 weeks.  If you have not heard from us then please contact us. The fastest way to get your results is to register for My Chart. ° ° °IF you received an x-ray today, you will receive an invoice from [facility]. Please contact [facility] at [phone] with questions or concerns regarding your invoice.  ° °IF you received labwork today, you will receive an invoice from [facility]. Please contact [facility] at [phone] with questions or concerns regarding your invoice.  ° °Our billing staff will not be able to assist you with questions regarding bills from these companies. ° °You will be contacted with the lab results as soon as they are available. The fastest way to get your results is to activate your My Chart account. Instructions are located on the last page of this paperwork. If you have not heard from us regarding the results in 2 weeks, please contact this office. °  ° ° ° °

## 2021-08-13 NOTE — Progress Notes (Signed)
Telemedicine Encounter- SOAP NOTE Established Patient  This video encounter was conducted with the patient's (or proxy's) verbal consent via audio telecommunications: yes/no: Yes Patient was instructed to have this encounter in a suitably private space; and to only have persons present to whom they give permission to participate. In addition, patient identity was confirmed by use of name plus two identifiers (DOB and address).  I discussed the limitations, risks, security and privacy concerns of performing an evaluation and management service by telephone and the availability of in person appointments. I also discussed with the patient that there may be a patient responsible charge related to this service. The patient expressed understanding and agreed to proceed.  I spent a total of 14 minutes talking with the patient or their proxy.  Patient at home Provider in office  Participants: [name], NP and [name]  Chief Complaint  Patient presents with   Cough    Patient states she thinks she has the flu because her kid tested positive now the whole house is sick. Per pt she has been having a cough and body ache.She has been taking many otc medications with no relief.    Subjective   [name] [name] is a 35 y.o. established patient. Video visit today for flu  HPI Cough, myalgias, fatigue Her child tested positive - close contact Has not been tested herself. 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 20 capsule 0   dextromethorphan-guaiFENesin (MUCINEX DM) 30-600 MG 12hr tablet Take 1 tablet by mouth 2 (two) times daily. 20 tablet 0   Fremanezumab-vfrm (AJOVY) 225 MG/1.5ML SOSY Inject 225 mg into the skin every 30 (thirty) days. 1.5 mL 11   frovatriptan (FROVA) 2.5 MG tablet Take 1 tablet (2.5 mg total) by mouth as needed for migraine. If recurs, may repeat after 2 hours. Max of 2 tabs in 24 hours. 10 tablet 4   ketoconazole (NIZORAL) 2 % cream Apply 1 application topically daily. 15 g 0   oseltamivir (TAMIFLU) 75 MG capsule Take 1 capsule (75 mg total) by mouth 2 (two) times daily. 10 capsule 0   predniSONE (DELTASONE) 10 MG tablet Begin taking 6 tablets daily, taper by one tablet daily until off the medication. 21 tablet 0   topiramate (TOPAMAX) 50 MG tablet Take 3 tablets (150 mg total) by mouth at bedtime. 270 tablet 1   TRIAMCINOLONE ACETONIDE, TOP, (TRITOCIN) 0.05 % OINT Apply 1 application topically daily. 110 g 0  diclofenac (CATAFLAM) 50 MG tablet Take 1 tablet (50 mg total) by mouth 3 (three) times daily as needed. (Patient not taking: Reported on 08/13/2021) 30 tablet 2   No current facility-administered medications for this visit.    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  Tobacco Use   Smoking status: Former   Smokeless tobacco: Never  Building services engineer Use: Never used  Substance and Sexual Activity   Alcohol use: Yes    Comment: occasionally   Drug use: Yes    Types: Marijuana    Comment: occasionally   Sexual activity: Yes    Birth control/protection: None  Other Topics Concern   Not on file  Social History Narrative   Not on file   Social Determinants of Health   Financial Resource Strain: Not on file  Food Insecurity: Not on file  Transportation Needs: Not on file  Physical Activity: Not on file  Stress: Not on file  Social Connections: Not on file  Intimate Partner Violence: Not on file    Review of Systems  Constitutional:  Positive for chills, fever and malaise/fatigue. Negative for diaphoresis and weight loss.  HENT: Negative.    Eyes: Negative.   Respiratory:  Positive for cough and sputum production. Negative for hemoptysis, shortness of breath and wheezing.   Cardiovascular: Negative.   Gastrointestinal: Negative.   Genitourinary: Negative.   Musculoskeletal:  Positive for myalgias. Negative for back pain, falls, joint pain and neck pain.  Skin: Negative.   Neurological: Negative.   Endo/Heme/Allergies: Negative.   Psychiatric/Behavioral: Negative.    All other systems reviewed and are negative.  Objective   Vitals as reported by the patient: Today's Vitals   08/13/21 1154  Weight: 160 lb (72.6 kg)    [name] [name] was seen today for cough.  Diagnoses and all orders for this visit:  Exposure to influenza -     oseltamivir (TAMIFLU) 75 MG capsule; Take 1 capsule (75 mg total) by mouth 2 (two) times daily.  Acute cough -     dextromethorphan-guaiFENesin (MUCINEX DM) 30-600 MG 12hr tablet; Take 1 tablet by mouth 2 (two) times daily. -     benzonatate (TESSALON) 100 MG capsule; Take 1 capsule (100 mg total) by mouth 3 (three) times daily as needed for  cough.  Flu-like symptoms -     oseltamivir (TAMIFLU) 75 MG capsule; Take 1 capsule (75 mg total) by mouth 2 (two) times daily.   PLAN Flu like symptoms with close exposure to influenza. Will treat as influenza with tamiflu as above Supportive care with tessalon and mucinex dm. ER and Urgent care precautions reviewed with patient Supportive care reviewed with patient Patient encouraged to call clinic with any questions, comments, or concerns.  I discussed the assessment and treatment plan with the patient. The patient was provided an opportunity to ask questions and all were answered. The patient agreed with the plan and demonstrated an understanding of the instructions.   The patient was advised to call back or seek an in-person evaluation if the symptoms worsen or if the condition fails to improve as anticipated.  I provided 14 minutes of face-to-face  time during this encounter.  Janeece Agee, NP

## 2021-08-17 ENCOUNTER — Encounter: Payer: Self-pay | Admitting: Registered Nurse

## 2021-08-17 NOTE — Telephone Encounter (Signed)
If we could print and place in inbox, can fill out by EOD tomorrow

## 2021-08-24 NOTE — Telephone Encounter (Signed)
Patient paperwork has been printed and sent to [facility] to be signed.

## 2021-08-31 NOTE — Telephone Encounter (Signed)
Paperwork will be faxed by the end of the day per provider.

## 2021-09-07 NOTE — Telephone Encounter (Signed)
Patients email from her employer has been put in the bin in the back so you could take a look. Thank you

## 2021-09-17 NOTE — Telephone Encounter (Signed)
Pt calling to check the status of the paperwork saying that it was suppose to be turned in today pt can be reached at [phone]

## 2021-09-20 NOTE — Telephone Encounter (Signed)
I spoke with [name] [name] on Friday and he was handling the paperwork for this patient.

## 2021-09-23 ENCOUNTER — Other Ambulatory Visit: Payer: Self-pay

## 2021-09-23 ENCOUNTER — Telehealth (INDEPENDENT_AMBULATORY_CARE_PROVIDER_SITE_OTHER): Payer: BC Managed Care – PPO | Admitting: Family Medicine

## 2021-09-23 ENCOUNTER — Encounter: Payer: Self-pay | Admitting: Family Medicine

## 2021-09-23 DIAGNOSIS — J069 Acute upper respiratory infection, unspecified: Secondary | ICD-10-CM | POA: Diagnosis not present

## 2021-09-23 DIAGNOSIS — R490 Dysphonia: Secondary | ICD-10-CM

## 2021-09-23 DIAGNOSIS — R059 Cough, unspecified: Secondary | ICD-10-CM | POA: Diagnosis not present

## 2021-09-23 MED ORDER — BENZONATATE 100 MG PO CAPS: 100.0000 mg | ORAL_CAPSULE | Freq: Three times a day (TID) | ORAL | 0 refills | Status: DC | PRN

## 2021-09-23 NOTE — Progress Notes (Signed)
Virtual Visit via Video Note  I connected with [name] on 09/23/21 at 5:30 PM by a video enabled telemedicine application and verified that I am speaking with the correct person using two identifiers.  Patient location: in car, by self My location: office - [place]    I discussed the limitations, risks, security and privacy concerns of performing an evaluation and management service by telephone and the availability of in person appointments. I also discussed with the patient that there may be a patient responsible charge related to this service. The patient expressed understanding and agreed to proceed, consent obtained  Chief complaint: Chief Complaint  Patient presents with   Cough    Patient states for about one week she has had a cough , sore throat and loss of voice. Patient was just sick with the flu and is feeling bad again.Pt has been taking some immunity shots , hot tea, and cough drops at this time and is feeling a little better.    History of Present Illness: [name] [name] is a 35 y.o. female  Cough: Past 1 week. Started last Thursday night. Itch/scratchy throat. Hurt to talk, hoarse voice next day. 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.  Logged off early on the 16th. Unable to RTW since that time. Persistent hoarse voice. Hurts to swallow.  No fever, chest pain. Drinking fluids, just sore. Able to eat foods at night.  Has not performed covid test.  Had covid vaccine, no booster.  Son was sick last week as well - same day. Unknown illness.  Did have influenza last month.  Today feels a little better, still some cough, hoarse voice.  Tx: tea, cough gtts.     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   S/P primary low  transverse C-section 07/20/2016   S/P cesarean section 07/20/2016   History of miscarriage 07/20/2013   Past Medical History:  Diagnosis Date   Allergy    Common migraine with intractable migraine 03/16/2018   Depression    Gonorrhea    Headache(784.0)    Infection    UTI   Shoulder dystocia, delivered, current hospitalization 06/14/2014   Vaginal Pap smear, abnormal    f/u ok   Past Surgical History:  Procedure Laterality Date   CESAREAN SECTION N/A    [employer] 06/06/2020   CESAREAN SECTION WITH BILATERAL TUBAL LIGATION Bilateral 07/20/2016   Procedure: REPEAT CESAREAN SECTION WITH BILATERAL TUBAL LIGATION;  Surgeon: Jaymes Graff, MD;  Location: [facility];  Service: Obstetrics;  Laterality: Bilateral;  Provider requests RNFA -ap   Allergies  Allergen Reactions   Pollen Extract Itching   Prior to Admission medications   Medication Sig Start Date End Date Taking? Authorizing Provider  diclofenac (CATAFLAM) 50 MG tablet Take 1 tablet (50 mg total) by mouth 3 (three) times daily as needed. 07/01/21  Yes York Spaniel, MD  Fremanezumab-vfrm (AJOVY) 225 MG/1.5ML SOSY Inject 225 mg into the skin every 30 (thirty) days. 07/01/20  Yes Glean Salvo, NP  frovatriptan (FROVA) 2.5 MG tablet Take 1 tablet (2.5 mg total) by mouth as needed for migraine. If recurs, may repeat after 2 hours. Max of 2 tabs in 24 hours. 06/10/20  Yes Janeece Agee, NP  ketoconazole (NIZORAL) 2 %  cream Apply 1 application topically daily. 09/07/20  Yes Just, Azalee Course, FNP  topiramate (TOPAMAX) 50 MG tablet Take 3 tablets (150 mg total) by mouth at bedtime. 07/01/21  Yes York Spaniel, MD  TRIAMCINOLONE ACETONIDE, TOP, (TRITOCIN) 0.05 % OINT Apply 1 application topically daily. 09/07/20  Yes Just, Azalee Course, FNP  benzonatate (TESSALON) 100 MG capsule Take 1 capsule (100 mg total) by mouth 3 (three) times daily as needed for cough. Patient not taking: Reported on 09/23/2021 08/13/21   Janeece Agee, NP   dextromethorphan-guaiFENesin [facility] DM) 30-600 MG 12hr tablet Take 1 tablet by mouth 2 (two) times daily. Patient not taking: Reported on 09/23/2021 08/13/21   Janeece Agee, NP  oseltamivir (TAMIFLU) 75 MG capsule Take 1 capsule (75 mg total) by mouth 2 (two) times daily. Patient not taking: Reported on 09/23/2021 08/13/21   Janeece Agee, NP  predniSONE (DELTASONE) 10 MG tablet Begin taking 6 tablets daily, taper by one tablet daily until off the medication. 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  Tobacco Use   Smoking status: Former   Smokeless tobacco: Never  Vaping Use   Vaping Use: Never used  Substance and Sexual Activity   Alcohol use: Yes    Comment: occasionally   Drug use: Yes    Types: Marijuana    Comment: occasionally   Sexual activity: Yes    Birth control/protection: None  Other Topics Concern   Not on file  Social History Narrative   Not on file   Social Determinants of Health   Financial Resource Strain: Not on file  Food Insecurity: Not on file  Transportation Needs: Not on file  Physical Activity: Not on file  Stress: Not on file  Social Connections: Not on file  Intimate Partner Violence: Not on file    Observations/Objective: There were no vitals filed for this visit. Nontoxic appearance on video.  Speaking in full sentences but hoarse voice.  Episodes of cough at times during visit.  No respiratory distress.  Coherent responses.  All questions were answered with understanding of plan expressed.  Assessment and Plan: Cough, unspecified type - Plan: benzonatate (TESSALON) 100 MG capsule  Upper respiratory tract infection, unspecified type - Plan:  benzonatate (TESSALON) 100 MG capsule  Hoarseness of voice - Plan: benzonatate (TESSALON) 100 MG capsule Suspected viral illness.  Recommended COVID testing but as symptoms improving unlikely change to treatment at this time.  Would potentially impact her return to work or specifically masking/isolation precautions.  Currently day 7.  Symptoms have improved.  Continued voice rest recommended.  Continue fluids.  Sore throat care.  Tessalon Perles for cough.  Note provided for work as her job involves talking on the phone.  Return to work in 4 days as long as symptoms are improving.  If not advised to let us know and we may need to follow-up to discuss return to work plan.  ER/urgent care precautions given.  Follow Up Instructions: As needed and above.    I discussed the assessment and treatment plan with the patient. The patient was provided an opportunity to ask questions and all were answered. The patient agreed  with the plan and demonstrated an understanding of the instructions.   The patient was advised to call back or seek an in-person evaluation if the symptoms worsen or if the condition fails to improve as anticipated.  Shade Flood, MD

## 2021-09-23 NOTE — Patient Instructions (Addendum)
Sorry to hear that you are sick again!  I am glad to hear your symptoms are improving today.  Unfortunately I suspect you did pick up another viral illness.  Tessalon Perles can be used for cough.  Voice rest is very important.  Make sure to drink plenty of fluids and either cold or hot fluids, whichever feels better for your throat.  Cepacol lozenges or other sore throat lozenges as fine.  Tylenol if needed for sore throat or pain.  COVID test may be helpful to determine contagiousness over the next few days, but would need to wear a mask for 3 additional days around others if that test is positive.  I did send a note for work to return on Monday if your symptoms have improved over the weekend.  If not please let us know and we can discuss further restrictions.  Hope you feel better soon.  Please be seen if any new or worsening symptoms.  Upper Respiratory Infection, Adult An upper respiratory infection (URI) is a common viral infection of the nose, throat, and upper air passages that lead to the lungs. The most common type of URI is the common cold. URIs usually get better on their own, without medical treatment. What are the causes? A URI is caused by a virus. You may catch a virus by: Breathing in droplets from an infected person's cough or sneeze. Touching something that has been exposed to the virus (is contaminated) and then touching your mouth, nose, or eyes. What increases the risk? You are more likely to get a URI if: You are very young or very old. You have close contact with others, such as at work, school, or a health care facility. You smoke. You have long-term (chronic) heart or lung disease. You have a weakened disease-fighting system (immune system). You have nasal allergies or asthma. You are experiencing a lot of stress. You have poor nutrition. What are the signs or symptoms? A URI usually involves some of the following symptoms: Runny or stuffy (congested)  nose. Cough. Sneezing. Sore throat. Headache. Fatigue. Fever. Loss of appetite. Pain in your forehead, behind your eyes, and over your cheekbones (sinus pain). Muscle aches. Redness or irritation of the eyes. Pressure in the ears or face. How is this diagnosed? This condition may be diagnosed based on your medical history and symptoms, and a physical exam. Your health care provider may use a swab to take a mucus sample from your nose (nasal swab). This sample can be tested to determine what virus is causing the illness. How is this treated? URIs usually get better on their own within 7-10 days. Medicines cannot cure URIs, but your health care provider may recommend certain medicines to help relieve symptoms, such as: Over-the-counter cold medicines. Cough suppressants. Coughing is a type of defense against infection that helps to clear the respiratory system, so take these medicines only as recommended by your health care provider. Fever-reducing medicines. Follow these instructions at home: Activity Rest as needed. If you have a fever, stay home from work or school until your fever is gone or until your health care provider says your URI cannot spread to other people (is no longer contagious). Your health care provider may have you wear a face mask to prevent your infection from spreading. Relieving symptoms Gargle with a mixture of salt and water 3-4 times a day or as needed. To make salt water, completely dissolve -1 tsp (3-6 g) of salt in 1 cup (237 mL) of  warm water. Use a cool-mist humidifier to add moisture to the air. This can help you breathe more easily. Eating and drinking  Drink enough fluid to keep your urine pale yellow. Eat soups and other clear broths. General instructions  Take over-the-counter and prescription medicines only as told by your health care provider. These include cold medicines, fever reducers, and cough suppressants. Do not use any products that  contain nicotine or tobacco. These products include cigarettes, chewing tobacco, and vaping devices, such as e-cigarettes. If you need help quitting, ask your health care provider. Stay away from secondhand smoke. Stay up to date on all immunizations, including the yearly (annual) flu vaccine. Keep all follow-up visits. This is important. How to prevent the spread of infection to others URIs can be contagious. To prevent the infection from spreading: Wash your hands with soap and water for at least 20 seconds. If soap and water are not available, use hand sanitizer. Avoid touching your mouth, face, eyes, or nose. Cough or sneeze into a tissue or your sleeve or elbow instead of into your hand or into the air.  Contact a health care provider if: You are getting worse instead of better. You have a fever or chills. Your mucus is brown or red. You have yellow or brown discharge coming from your nose. You have pain in your face, especially when you bend forward. You have swollen neck glands. You have pain while swallowing. You have white areas in the back of your throat. Get help right away if: You have shortness of breath that gets worse. You have severe or persistent: Headache. Ear pain. Sinus pain. Chest pain. You have chronic lung disease along with any of the following: Making high-pitched whistling sounds when you breathe, most often when you breathe out (wheezing). Prolonged cough (more than 14 days). Coughing up blood. A change in your usual mucus. You have a stiff neck. You have changes in your: Vision. Hearing. Thinking. Mood. These symptoms may be an emergency. Get help right away. Call 911. Do not wait to see if the symptoms will go away. Do not drive yourself to the hospital. Summary An upper respiratory infection (URI) is a common infection of the nose, throat, and upper air passages that lead to the lungs. A URI is caused by a virus. URIs usually get better on  their own within 7-10 days. Medicines cannot cure URIs, but your health care provider may recommend certain medicines to help relieve symptoms. This information is not intended to replace advice given to you by your health care provider. Make sure you discuss any questions you have with your health care provider. Document Revised: 04/21/2021 Document Reviewed: 04/21/2021 Elsevier Patient Education  2022 [employer] Inc.   Hoarseness Hoarseness, also called dysphonia, is any abnormal change in your voice that can make it difficult to speak. Your voice may sound raspy, breathy, or strained. Hoarseness is caused by a problem with your vocal cords (vocal folds). These are two bands of tissue inside your voice box (larynx). When you speak, your vocal cords move back and forth to create sound. The surfaces of your vocal cords need to be smooth for your voice to sound clear. Swelling or lumps on your vocal cords can cause hoarseness. Vocal cord problems may be the result of injuries or abnormal growths, certain diseases, upper respiratory infection, or allergies. Other causes may include medicine side effects and exposure to irritants. Follow these instructions at home: Pay attention to any changes in your symptoms.  Take these actions to stay safe and to help relieve your symptoms: Lifestyle Do not eat foods that give you heartburn, such as spicy or acidic foods like hot peppers and orange juice. These foods can cause a gastroesophageal reflux that may worsen your vocal cord problems. Limit how much alcohol and caffeine you drink as told by your health care provider. Drink enough fluid to keep your urine pale yellow. Do not use any products that contain nicotine or tobacco. These products include cigarettes, chewing tobacco, and vaping devices, such as e-cigarettes. If you need help quitting, ask your health care provider. Avoid secondhand smoke. General instructions Use a humidifier if the air in your  home is dry. Avoid coughing or clearing your throat. Do not whisper. Whispering can cause muscle strain. Do not speak in a loud or harsh voice. Rest your voice. If recommended by your health care provider, schedule an appointment with a speech-language specialist. This specialist may give you methods to try that can help you avoid misusing your voice. Contact a health care provider if: Your voice is hoarse longer than 2 weeks. You almost lose or completely lose your voice for more than 3 days. You have pain when you swallow or try to talk. You feel a lump in your neck. Get help right away if: You have trouble swallowing. You feel like you are choking when you swallow. You cough up blood or vomit blood. You have trouble breathing. You choke, cannot swallow, or cannot breathe if you lie flat. You notice swelling or a rash on your body, face, or tongue. These symptoms may represent a serious problem that is an emergency. Do not wait to see if the symptoms will go away. Get medical help right away. Call your local emergency services (911 in the [place] Do not drive yourself to the hospital. Summary Hoarseness, also called dysphonia, is any abnormal change in your voice that can make it difficult to speak. Your voice may sound raspy, breathy, or strained. Hoarseness is caused by a problem with your vocal cords (vocal folds). Do not speak in a loud or harsh voice, whisper, use nicotine or tobacco products, or eat foods that give you heartburn. See your health care provider if your hoarseness does not improve after 2 weeks. This information is not intended to replace advice given to you by your health care provider. Make sure you discuss any questions you have with your health care provider. Document Revised: 03/09/2021 Document Reviewed: 03/09/2021 Elsevier Patient Education  2022 [employer].    If you have lab work done today you will be contacted with your lab results within the next 2  weeks.  If you have not heard from [place] then please contact us. The fastest way to get your results is to register for My Chart.   IF you received an x-ray today, you will receive an invoice from [facility]. Please contact [facility] at [phone] with questions or concerns regarding your invoice.   IF you received labwork today, you will receive an invoice from [facility]. Please contact [facility] at [phone] with questions or concerns regarding your invoice.   Our billing staff will not be able to assist you with questions regarding bills from these companies.  You will be contacted with the lab results as soon as they are available. The fastest way to get your results is to activate your My Chart account. Instructions are located on the last page of this paperwork. If you have not heard from  [place] regarding the results in 2 weeks, please contact this office.

## 2021-09-28 ENCOUNTER — Ambulatory Visit: Payer: BC Managed Care – PPO | Admitting: Registered Nurse

## 2021-10-01 ENCOUNTER — Encounter: Payer: Self-pay | Admitting: Registered Nurse

## 2021-10-01 NOTE — Telephone Encounter (Signed)
Pt has forwarded forms to be filled out but was unsure if you can fill these out or if Dr Randa Evens will need to.  Placed in you sign folder for review

## 2021-10-12 ENCOUNTER — Telehealth (INDEPENDENT_AMBULATORY_CARE_PROVIDER_SITE_OTHER): Payer: BC Managed Care – PPO | Admitting: Registered Nurse

## 2021-10-12 ENCOUNTER — Other Ambulatory Visit: Payer: Self-pay

## 2021-10-12 ENCOUNTER — Encounter: Payer: Self-pay | Admitting: Registered Nurse

## 2021-10-12 VITALS — Wt 152.0 lb

## 2021-10-12 DIAGNOSIS — J22 Unspecified acute lower respiratory infection: Secondary | ICD-10-CM

## 2021-10-12 DIAGNOSIS — R051 Acute cough: Secondary | ICD-10-CM | POA: Diagnosis not present

## 2021-10-12 DIAGNOSIS — U071 COVID-19: Secondary | ICD-10-CM | POA: Diagnosis not present

## 2021-10-12 MED ORDER — DM-GUAIFENESIN ER 30-600 MG PO TB12: 1.0000 | ORAL_TABLET | Freq: Two times a day (BID) | ORAL | 0 refills | Status: DC

## 2021-10-12 MED ORDER — PREDNISONE 10 MG PO TABS: ORAL_TABLET | ORAL | 0 refills | Status: DC

## 2021-10-12 MED ORDER — DOXYCYCLINE HYCLATE 100 MG PO TABS: 100.0000 mg | ORAL_TABLET | Freq: Two times a day (BID) | ORAL | 0 refills | Status: DC

## 2021-10-12 MED ORDER — BENZONATATE 200 MG PO CAPS: 200.0000 mg | ORAL_CAPSULE | Freq: Two times a day (BID) | ORAL | 0 refills | Status: DC | PRN

## 2021-10-12 NOTE — Progress Notes (Signed)
Telemedicine Encounter- SOAP NOTE Established Patient  This telephone encounter was conducted with the patient's (or proxy's) verbal consent via audio telecommunications: yes/no: Yes Patient was instructed to have this encounter in a suitably private space; and to only have persons present to whom they give permission to participate. In addition, patient identity was confirmed by use of name plus two identifiers (DOB and address).  I discussed the limitations, risks, security and privacy concerns of performing an evaluation and management service by telephone and the availability of in person appointments. I also discussed with the patient that there may be a patient responsible charge related to this service. The patient expressed understanding and agreed to proceed.  I spent a total of 14 minutes talking with the patient or their proxy.  Patient at home Provider in office  Participants: [name], NP and [name]  Chief Complaint  Patient presents with   Covid Positive    Patient states she tested positive for covid last Thursday. Patient has been experiencing body aches, chills, congestion, cough, fever and sneezing. Patient took otc medications with some relief she is only having the sweating in her sleep and cough.    Subjective   [name] [name] is a 36 y.o. established patient. Telephone visit today for COVID+  HPI Symptoms started last Monday - sneezing a lot, congestion Tuesday started developing body aches, fatigue Wednesday more pain, fever, cough. Tested positive on Thursday - positive test.  Her son had tested positive the week before her symptoms set in  Did have COVID   Has been out of work since 10/07/21.  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 20 capsule 0   diclofenac (CATAFLAM) 50 MG tablet Take 1 tablet (50 mg total) by mouth 3 (three) times daily as needed. 30 tablet 2   doxycycline (VIBRA-TABS) 100 MG tablet Take 1 tablet (100 mg total) by mouth 2 (two) times daily. 20 tablet 0   Fremanezumab-vfrm (AJOVY) 225 MG/1.5ML SOSY Inject 225 mg into the skin every 30 (thirty) days. 1.5 mL 11   frovatriptan (FROVA) 2.5 MG tablet Take 1 tablet (2.5 mg total) by mouth as needed for migraine. If recurs, may repeat after 2 hours. Max of 2 tabs in 24 hours. 10 tablet 4   ketoconazole (NIZORAL) 2 % cream Apply 1 application topically daily. 15 g 0   topiramate (TOPAMAX) 50 MG tablet Take 3 tablets (150 mg total) by mouth at bedtime. 270 tablet 1   TRIAMCINOLONE ACETONIDE, TOP, (TRITOCIN) 0.05 % OINT Apply 1 application topically daily. 110 g 0   dextromethorphan-guaiFENesin (MUCINEX DM) 30-600 MG  12hr tablet Take 1 tablet by mouth 2 (two) times daily. 20 tablet 0   predniSONE (DELTASONE) 10 MG tablet Begin taking 6 tablets daily, taper by one tablet daily until off the medication. 21 tablet 0   No current facility-administered medications for this visit.    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  Tobacco Use   Smoking status: Former   Smokeless tobacco: Never  Building services engineer Use: Never used  Substance and Sexual Activity   Alcohol use: Yes    Comment: occasionally   Drug use: Yes    Types: Marijuana    Comment: occasionally   Sexual activity: Yes    Birth control/protection: None  Other Topics Concern   Not on file  Social History Narrative   Not on file   Social Determinants of Health   Financial Resource Strain: Not on file  Food Insecurity: Not on file  Transportation Needs: Not on file  Physical Activity: Not on file  Stress: Not on file  Social Connections: Not on file  Intimate Partner Violence: Not on file    Review of Systems  Constitutional:  Positive for fever and malaise/fatigue. Negative for chills, diaphoresis and weight loss.  HENT: Negative.    Eyes: Negative.   Respiratory:  Positive for cough, sputum production and shortness of breath. Negative for hemoptysis and wheezing.   Cardiovascular: Negative.   Gastrointestinal: Negative.   Genitourinary: Negative.   Musculoskeletal: Negative.   Skin: Negative.   Neurological: Negative.   Endo/Heme/Allergies: Negative.   Psychiatric/Behavioral: Negative.    All other systems reviewed and are negative.  Objective   Vitals as reported by the patient: Today's Vitals   10/12/21 0819  Weight: 152 lb (68.9 kg)    [name] was seen today for covid positive.  Diagnoses and all orders for this visit:  Lower respiratory infection -     dextromethorphan-guaiFENesin (MUCINEX DM) 30-600 MG 12hr tablet; Take 1 tablet by mouth 2 (two) times daily. -     predniSONE (DELTASONE) 10 MG tablet; Begin taking 6 tablets daily, taper by one tablet daily until off the medication. -     doxycycline (VIBRA-TABS) 100 MG tablet; Take 1 tablet (100 mg total) by  mouth 2 (two) times daily. -     benzonatate (TESSALON) 200 MG capsule; Take 1 capsule (200 mg total) by mouth 2 (two) times daily as needed for cough.  Acute cough -     dextromethorphan-guaiFENesin (MUCINEX DM) 30-600 MG 12hr tablet; Take 1 tablet by mouth 2 (two) times daily.  COVID-19 -     dextromethorphan-guaiFENesin (MUCINEX DM) 30-600 MG 12hr tablet; Take 1 tablet by mouth 2 (two) times daily. -     predniSONE (DELTASONE) 10 MG tablet; Begin taking 6 tablets daily, taper by one tablet daily until off the medication. -     doxycycline (VIBRA-TABS) 100 MG tablet; Take 1 tablet (100 mg total) by mouth 2 (two) times daily. -     benzonatate (TESSALON) 200 MG capsule; Take 1 capsule (200 mg total) by mouth 2 (two) times daily as needed for cough.    PLAN Given lower respiratory  congestion, fatigue, and nocturnal sweats, concern for developing pna. Tx as above Supportive care with mucinex dm, tessalon, tylenol, rest, and hydration ER and return precautions reviewed. Pt to be excused from all work duties through end of week and will return next week with anticipated improvement. Patient encouraged to call clinic with any questions, comments, or concerns.  I discussed the assessment and treatment plan with the patient. The patient was provided an opportunity to ask questions and all were answered. The patient agreed with the plan and demonstrated an understanding of the instructions.   The patient was advised to call back or seek an in-person evaluation if the symptoms worsen or if the condition fails to improve as anticipated.  I provided 14 minutes of non-face-to-face time during this encounter.  Janeece Ageeichard Kaiyan Luczak, NP

## 2021-10-14 ENCOUNTER — Encounter: Payer: Self-pay | Admitting: Registered Nurse

## 2021-10-14 NOTE — Telephone Encounter (Signed)
Patients paperwork has been printed and placed in the back bin.

## 2021-10-25 NOTE — Telephone Encounter (Signed)
Pt called in to check on this pw she states that it has to be turned in today.   Please advise and let pt know when this has been done

## 2021-10-26 NOTE — Telephone Encounter (Signed)
I called patient on 10/25/2021 to let her know that I was faxing her paper over to [employer].

## 2021-11-03 NOTE — Telephone Encounter (Signed)
If we could reprint forms and I'll try again... thanks  Rich

## 2021-11-03 NOTE — Telephone Encounter (Signed)
Please advise patient has some concerns about her FMLA paperwork.

## 2021-11-03 NOTE — Progress Notes (Signed)
PATIENT: [name] HammansYan Long [name] DOB: 01/20/1986  REASON FOR VISIT: Follow up for migraine HISTORY FROM: Patient PRIMARY NEUROLOGIST: Dr. Delena Balihima   HISTORY OF PRESENT ILLNESS: Today 11/04/21 [name] [name] is here today for follow-up with history of intractable migraine headache. On average reporting about 1 migraine a week.  Topamax was increased at last visit, she has done well with this.  She remains on Ajovy.  For acute headache, she will take Frova.  Usually starts to work within 30 minutes to 1 hour.  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  She may miss 1 to 2 days of work a month due to migraine.  She has 3 kids.  She is currently pleased with her migraine control.  She did not pick up the diclofenac potassium.  Has not been able to tolerate nortriptyline due to excessive drowsiness.  HISTORY  07/01/2021 Dr. Anne HahnWillis: [name] [name] is a 36 year old right-handed female with a history of intractable migraine headache.  She had done quite a bit better on Ajovy, she was also on nortriptyline and Topamax.  She stopped the nortriptyline at a 50 mg dose in August 2022.  Within a few weeks, she began developing fairly severe bifrontal headaches that are essentially daily at this point.  The patient indicates that the headaches are in the same location as her usual migraines but are more severe and associated with severe photophobia.  The patient does not have nausea or vomiting but she may have some queasiness.  The headaches are throbbing in nature.  She does have some sinus drainage at times, this is a recent issue.  She does have a history of allergies.  She did have a sinus infection back in May 2022.  She has not been able to go to work since 14 June 2021  REVIEW OF SYSTEMS: Out of a complete 14 system review of symptoms, the patient complains only of the following symptoms, and all other reviewed systems are negative.  Headache   ALLERGIES: Allergies  Allergen Reactions   Pollen  Extract Itching    HOME MEDICATIONS: Outpatient Medications Prior to Visit  Medication Sig Dispense Refill   Fremanezumab-vfrm (AJOVY) 225 MG/1.5ML SOSY Inject 225 mg into the skin every 30 (thirty) days. 1.5 mL 11   frovatriptan (FROVA) 2.5 MG tablet Take 1 tablet (2.5 mg total) by mouth as needed for migraine. If recurs, may repeat after 2 hours. Max of 2 tabs in 24 hours. 10 tablet 4   ketoconazole (NIZORAL) 2 % cream Apply 1 application topically daily. 15 g 0   topiramate (TOPAMAX) 50 MG tablet Take 3 tablets (150 mg total) by mouth at bedtime. 270 tablet 1   TRIAMCINOLONE ACETONIDE, TOP, (TRITOCIN) 0.05 % OINT Apply 1 application topically daily. 110 g 0   benzonatate (TESSALON) 200 MG capsule Take 1 capsule (200 mg total) by mouth 2 (two) times daily as needed for cough. 20 capsule 0   dextromethorphan-guaiFENesin (MUCINEX DM) 30-600 MG 12hr tablet Take 1 tablet by mouth 2 (two) times daily. 20 tablet 0   diclofenac (CATAFLAM) 50 MG tablet Take 1 tablet (50 mg total) by mouth 3 (three) times daily as needed. 30 tablet 2   doxycycline (VIBRA-TABS) 100 MG tablet Take 1 tablet (100 mg total) by mouth 2 (two) times daily. 20 tablet 0   predniSONE (DELTASONE) 10 MG tablet Begin taking 6 tablets daily, taper by one tablet daily until off the medication. 21 tablet 0   No facility-administered medications  prior to visit.    PAST MEDICAL HISTORY: Past Medical History:  Diagnosis Date   Allergy    Common migraine with intractable migraine 03/16/2018   Depression    Gonorrhea    Headache(784.0)    Infection    UTI   Shoulder dystocia, delivered, current hospitalization 06/14/2014   Vaginal Pap smear, abnormal    f/u ok    PAST SURGICAL HISTORY: Past Surgical History:  Procedure Laterality Date   CESAREAN SECTION N/A    [employer]reesia 06/06/2020   CESAREAN SECTION WITH BILATERAL TUBAL LIGATION Bilateral 07/20/2016   Procedure: REPEAT CESAREAN SECTION WITH BILATERAL TUBAL LIGATION;   Surgeon: Jaymes Graff, MD;  [facility]: [facility];  Service: Obstetrics;  Laterality: Bilateral;  Provider requests RNFA -ap    FAMILY HISTORY: Family History  Problem Relation Age of Onset   Diabetes Mother    Hearing loss Neg Hx     SOCIAL HISTORY: 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  Tobacco Use   Smoking status: Former   Smokeless tobacco: Never  Building services engineer Use: Never used  Substance and Sexual Activity   Alcohol use: Yes    Comment: occasionally   Drug use: Yes    Types: Marijuana    Comment: occasionally   Sexual activity: Yes    Birth control/protection: None  Other Topics Concern   Not on file  Social History Narrative   Not on file   Social Determinants of Health   Financial Resource Strain: Not on file  Food Insecurity: Not on file  Transportation Needs: Not on file  Physical Activity: Not on file  Stress: Not on file  Social Connections: Not on file  Intimate Partner Violence: Not on file   PHYSICAL EXAM  Vitals:   11/04/21 1004  BP: (!) 128/93  Pulse: 79  Weight: 155 lb 8 oz (70.5 kg)  Height: 5\' 1"  (1.549 m)   Body mass index is 29.38 kg/m.  Generalized: Well developed, in no acute distress   Neurological examination  Mentation: Alert oriented to time, place, history taking. Follows all commands speech and language fluent Cranial nerve II-XII: Pupils were equal round reactive to light. Extraocular movements were full, visual field were full on confrontational test. Facial sensation and strength were normal. Head turning and shoulder shrug  were normal and symmetric. Motor: The motor testing reveals 5 over 5 strength of all 4 extremities. Good symmetric motor tone is noted throughout.  Sensory: Sensory  testing is intact to soft touch on all 4 extremities. No evidence of extinction is noted.  Coordination: Cerebellar testing reveals good finger-nose-finger and heel-to-shin bilaterally.  Gait and station: Gait is normal. Reflexes: Deep tendon reflexes are symmetric and normal bilaterally.   DIAGNOSTIC DATA (LABS, IMAGING, TESTING) - I reviewed patient records, labs, notes, testing and imaging myself where available.  Lab Results  Component Value Date   WBC 5.1 10/14/2020   HGB 13.3 10/14/2020   HCT 39.7 10/14/2020   MCV 86 10/14/2020   PLT 206 03/01/2020      Component Value Date/Time   NA 141 10/14/2020 0957   K 4.3 10/14/2020 0957   CL 104 10/14/2020 0957   CO2 23 10/14/2020 0957   GLUCOSE  107 (H) 10/14/2020 0957   GLUCOSE 96 03/01/2020 1426   GLUCOSE 65 (L) 04/10/2014 1556   BUN 11 10/14/2020 0957   CREATININE 0.84 10/14/2020 0957   CALCIUM 9.8 10/14/2020 0957   PROT 7.5 10/14/2020 0957   ALBUMIN 4.7 10/14/2020 0957   AST 22 10/14/2020 0957   ALT 20 10/14/2020 0957   ALKPHOS 54 10/14/2020 0957   BILITOT 0.4 10/14/2020 0957   GFRNONAA 90 10/14/2020 0957   GFRAA 103 10/14/2020 0957   Lab Results  Component Value Date   CHOL 178 10/14/2020   HDL 40 10/14/2020   LDLCALC 127 (H) 10/14/2020   TRIG 58 10/14/2020   CHOLHDL 4.5 (H) 10/14/2020   Lab Results  Component Value Date   HGBA1C 5.2 10/14/2020   No results found for: VITAMINB12 Lab Results  Component Value Date   TSH 2.370 10/14/2020   ASSESSMENT AND PLAN 36 y.o. year old female  has a past medical history of Allergy, Common migraine with intractable migraine (03/16/2018), Depression, Gonorrhea, Headache(784.0), Infection, Shoulder dystocia, delivered, current hospitalization (06/14/2014), and Vaginal Pap smear, abnormal. here with:  Migraine   -She is overall doing well, is pleased with current migraine control, on average about 1 migraine a week -Continue Ajovy, Topamax for migraine prevention -Take  Frova or diclofenac potassium PRN for acute treatment  -Will complete FMLA paperwork, she works for [employer] -Follow-up in 6 months or sooner if needed, she will be followed by Dr. Julieanne Manson, AGNP-C, DNP 11/04/2021, 10:23 AM [facility] [address] [place], [place] [address] [phone]

## 2021-11-03 NOTE — Telephone Encounter (Signed)
Pt would like a phone call when this has been done.

## 2021-11-04 ENCOUNTER — Ambulatory Visit: Payer: BC Managed Care – PPO | Admitting: Neurology

## 2021-11-04 ENCOUNTER — Encounter: Payer: Self-pay | Admitting: Neurology

## 2021-11-04 DIAGNOSIS — G43019 Migraine without aura, intractable, without status migrainosus: Secondary | ICD-10-CM

## 2021-11-04 MED ORDER — TOPIRAMATE 50 MG PO TABS: 150.0000 mg | ORAL_TABLET | Freq: Every day | ORAL | 3 refills | Status: DC

## 2021-11-04 MED ORDER — AJOVY 225 MG/1.5ML ~~LOC~~ SOSY: 225.0000 mg | PREFILLED_SYRINGE | SUBCUTANEOUS | 11 refills | Status: DC

## 2021-11-04 MED ORDER — FROVATRIPTAN SUCCINATE 2.5 MG PO TABS: 2.5000 mg | ORAL_TABLET | ORAL | 4 refills | Status: DC | PRN

## 2021-11-04 MED ORDER — DICLOFENAC POTASSIUM 50 MG PO TABS: 50.0000 mg | ORAL_TABLET | Freq: Three times a day (TID) | ORAL | 1 refills | Status: DC | PRN

## 2021-11-04 NOTE — Patient Instructions (Signed)
Great to see you today! Continue current medications, will resend and diclofenac potassium to take as needed for acute headache Call me if your headaches increase, we will see back in 6 months

## 2021-11-09 ENCOUNTER — Ambulatory Visit: Payer: BC Managed Care – PPO | Admitting: Registered Nurse

## 2021-11-10 NOTE — Telephone Encounter (Signed)
Called and spoke with patient regarding payment for paperwork and signed records release form. Payment information was given to billing and patient will sign records release form when she comes in to pick up paperwork.

## 2021-11-11 DIAGNOSIS — Z0289 Encounter for other administrative examinations: Secondary | ICD-10-CM

## 2021-11-16 NOTE — Telephone Encounter (Signed)
Have reprinted, [name] [name] is going to look for the copy from 13 days ago to refax that.

## 2021-12-08 ENCOUNTER — Encounter: Payer: Self-pay | Admitting: Registered Nurse

## 2021-12-08 ENCOUNTER — Other Ambulatory Visit: Payer: Self-pay

## 2021-12-08 ENCOUNTER — Ambulatory Visit: Payer: BC Managed Care – PPO | Admitting: Registered Nurse

## 2021-12-08 VITALS — BP 129/75 | HR 67 | Temp 98.0°F | Resp 18 | Ht 61.0 in | Wt 156.6 lb

## 2021-12-08 DIAGNOSIS — J301 Allergic rhinitis due to pollen: Secondary | ICD-10-CM

## 2021-12-08 MED ORDER — AZELASTINE HCL 0.1 % NA SOLN: 1.0000 | Freq: Two times a day (BID) | NASAL | 12 refills | Status: DC

## 2021-12-08 MED ORDER — CETIRIZINE HCL 10 MG PO TABS: 10.0000 mg | ORAL_TABLET | Freq: Every day | ORAL | 11 refills | Status: DC

## 2021-12-08 MED ORDER — MONTELUKAST SODIUM 10 MG PO TABS: 10.0000 mg | ORAL_TABLET | Freq: Every day | ORAL | 3 refills | Status: DC

## 2021-12-08 MED ORDER — PREDNISONE 10 MG (21) PO TBPK: ORAL_TABLET | ORAL | 0 refills | Status: DC

## 2021-12-08 NOTE — Telephone Encounter (Signed)
FMLA paperwork was faxed.

## 2021-12-08 NOTE — Patient Instructions (Addendum)
Ms. Bonnell - ? ?Great to see you ? ?FMLA - we keep a copy on file in case ? ?Meds sent - prednisone only ideal for short course.  ?Others can use regularly. ? ?Let me know if things worsen or fail to improve ? ?Thanks, ? ?Rich  ? ? ? ?If you have lab work done today you will be contacted with your lab results within the next 2 weeks.  If you have not heard from [place] then please contact us. The fastest way to get your results is to register for My Chart. ? ? ?IF you received an x-ray today, you will receive an invoice from [facility]. Please contact [facility] at [phone] with questions or concerns regarding your invoice.  ? ?IF you received labwork today, you will receive an invoice from [facility]. Please contact [facility] at [phone] with questions or concerns regarding your invoice.  ? ?Our billing staff will not be able to assist you with questions regarding bills from these companies. ? ?You will be contacted with the lab results as soon as they are available. The fastest way to get your results is to activate your My Chart account. Instructions are located on the last page of this paperwork. If you have not heard from [place] regarding the results in 2 weeks, please contact this office. ?  ? ? ?

## 2021-12-08 NOTE — Progress Notes (Signed)
? ?Established Patient Office Visit ? ?Subjective:  ?Patient ID: [name] [name], female    DOB: Mar 21, 1986  Age: 36 y.o. MRN: [id number] ? ?CC:  ?Chief Complaint  ?Patient presents with  ? Nasal Congestion  ?  Patient states she is here for allergies and FMLA. Patient states she have tried multi  ? ? ?HPI ?[name] [name] presents for nasal congestion ? ?Seasonal allergies ?Frequent sinusitis ?Nasal congestion, pnd, recurrent sinus infections.  ?Has tried multiple allergies.  ? ?FMLA ?1-3 episodes per month of 1-3 days per episode. Recurrent sinusitis.  ? ?Past Medical History:  ?Diagnosis Date  ? Allergy   ? Common migraine with intractable migraine 03/16/2018  ? Depression   ? Gonorrhea   ? Headache(784.0)   ? Infection   ? UTI  ? Shoulder dystocia, delivered, current hospitalization 06/14/2014  ? Vaginal Pap smear, abnormal   ? f/u ok  ? ? ?Past Surgical History:  ?Procedure Laterality Date  ? CESAREAN SECTION N/A   ? [employer]reesia 06/06/2020  ? CESAREAN SECTION WITH BILATERAL TUBAL LIGATION Bilateral 07/20/2016  ? Procedure: REPEAT CESAREAN SECTION WITH BILATERAL TUBAL LIGATION;  Surgeon: Jaymes Graff, MD;  Location: [facility];  Service: Obstetrics;  Laterality: Bilateral;  Provider requests RNFA -ap  ? ? ?Family History  ?Problem Relation Age of Onset  ? Diabetes Mother   ? Hearing loss Neg Hx   ? ? ?Social History  ? ?Socioeconomic History  ? Marital status: Married  ?  Spouse name: Not on file  ? Number of children: 3  ? Years of education: Not on file  ? Highest education level: Not on file  ?Occupational History  ?  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 Smoking status: Former  ? Smokeless tobacco: Never  ?Vaping Use  ? Vaping Use: Never used  ?Substance and Sexual Activity  ? Alcohol use: Yes  ?  Comment: occasionally  ? Drug use: Yes  ?  Types: Marijuana  ?  Comment: occasionally  ? Sexual activity: Yes  ?  Birth control/protection: None  ?Other Topics Concern  ? Not on file  ?Social  History Narrative  ? Not on file  ? ?Social Determinants of Health  ? ?Financial Resource Strain: Not on file  ?Food Insecurity: Not on file  ?Transportation Needs: Not on file  ?Physical Activity: Not on file  ?Stress: Not on file  ?Social Connections: Not on file  ?Intimate Partner Violence: Not on file  ? ? ?Outpatient Medications Prior to Visit  ?Medication Sig Dispense Refill  ? diclofenac (CATAFLAM) 50 MG tablet Take 1 tablet (50 mg total) by mouth 3 (three) times daily as needed (for migraine headache). 30 tablet 1  ? Fremanezumab-vfrm (AJOVY) 225 MG/1.5ML SOSY Inject 225 mg into the skin every 30 (thirty) days. 1.5 mL 11  ? frovatriptan (FROVA) 2.5 MG tablet Take 1 tablet (2.5 mg total) by mouth as needed for migraine. If recurs, may repeat after 2 hours. Max of 2 tabs in 24 hours. 10 tablet 4  ? ketoconazole (NIZORAL) 2 % cream Apply 1 application topically daily. 15 g 0  ? topiramate (TOPAMAX) 50 MG tablet Take 3 tablets (150 mg total) by mouth at bedtime. 270 tablet 3  ? TRIAMCINOLONE ACETONIDE, TOP, (TRITOCIN) 0.05 % OINT Apply 1 application topically daily. 110 g 0  ? ?No facility-administered medications prior to visit.  ? ? ?Allergies  ?Allergen Reactions  ? Pollen Extract Itching  ? ? ?ROS ?Review of  Systems ?Per hpi  ? ?  ?Objective:  ?  ?Physical Exam ?Vitals and nursing note reviewed.  ?Constitutional:   ?   General: She is not in acute distress. ?   Appearance: Normal appearance. She is normal weight. She is not ill-appearing, toxic-appearing or diaphoretic.  ?Cardiovascular:  ?   Rate and Rhythm: Normal rate and regular rhythm.  ?   Heart sounds: Normal heart sounds. No murmur heard. ?  No friction rub. No gallop.  ?Pulmonary:  ?   Effort: Pulmonary effort is normal. No respiratory distress.  ?   Breath sounds: Normal breath sounds. No stridor. No wheezing, rhonchi or rales.  ?Chest:  ?   Chest wall: No tenderness.  ?Skin: ?   General: Skin is warm and dry.  ?Neurological:  ?   General: No focal  deficit present.  ?   Mental Status: She is alert and oriented to person, place, and time. Mental status is at baseline.  ?Psychiatric:     ?   Mood and Affect: Mood normal.     ?   Behavior: Behavior normal.     ?   Thought Content: Thought content normal.     ?   Judgment: Judgment normal.  ? ? ?BP 129/75   Pulse 67   Temp 98 ?F (36.7 ?C) (Temporal)   Resp 18   Ht 5\' 1"  (1.549 m)   Wt 156 lb 9.6 oz (71 kg)   SpO2 99%   BMI 29.59 kg/m?  ?Wt Readings from Last 3 Encounters:  ?12/08/21 156 lb 9.6 oz (71 kg)  ?11/04/21 155 lb 8 oz (70.5 kg)  ?10/12/21 152 lb (68.9 kg)  ? ? ? ?Health Maintenance Due  ?Topic Date Due  ? Hepatitis C Screening  Never done  ? PAP SMEAR-Modifier  11/05/2016  ? COVID-19 Vaccine (3 - Booster for Pfizer series) 10/01/2020  ? INFLUENZA VACCINE  05/03/2021  ? ? ?There are no preventive care reminders to display for this patient. ? ?Lab Results  ?Component Value Date  ? TSH 2.370 10/14/2020  ? ?Lab Results  ?Component Value Date  ? WBC 5.1 10/14/2020  ? HGB 13.3 10/14/2020  ? HCT 39.7 10/14/2020  ? MCV 86 10/14/2020  ? PLT 206 03/01/2020  ? ?Lab Results  ?Component Value Date  ? NA 141 10/14/2020  ? K 4.3 10/14/2020  ? CO2 23 10/14/2020  ? GLUCOSE 107 (H) 10/14/2020  ? BUN 11 10/14/2020  ? CREATININE 0.84 10/14/2020  ? BILITOT 0.4 10/14/2020  ? ALKPHOS 54 10/14/2020  ? AST 22 10/14/2020  ? ALT 20 10/14/2020  ? PROT 7.5 10/14/2020  ? ALBUMIN 4.7 10/14/2020  ? CALCIUM 9.8 10/14/2020  ? ANIONGAP 10 03/01/2020  ? ?Lab Results  ?Component Value Date  ? CHOL 178 10/14/2020  ? ?Lab Results  ?Component Value Date  ? HDL 40 10/14/2020  ? ?Lab Results  ?Component Value Date  ? LDLCALC 127 (H) 10/14/2020  ? ?Lab Results  ?Component Value Date  ? TRIG 58 10/14/2020  ? ?Lab Results  ?Component Value Date  ? CHOLHDL 4.5 (H) 10/14/2020  ? ?Lab Results  ?Component Value Date  ? HGBA1C 5.2 10/14/2020  ? ? ?  ?Assessment & Plan:  ? ?Problem List Items Addressed This Visit   ?None ?Visit Diagnoses   ? ?  Seasonal allergic rhinitis due to pollen    -  Primary  ? Relevant Medications  ? cetirizine (ZYRTEC) 10 MG tablet  ? predniSONE (STERAPRED  UNI-PAK 21 TAB) 10 MG (21) TBPK tablet  ? montelukast (SINGULAIR) 10 MG tablet  ? azelastine (ASTELIN) 0.1 % nasal spray  ? ?  ? ? ?Meds ordered this encounter  ?Medications  ? cetirizine (ZYRTEC) 10 MG tablet  ?  Sig: Take 1 tablet (10 mg total) by mouth daily.  ?  Dispense:  30 tablet  ?  Refill:  11  ?  Order Specific Question:   Supervising Provider  ?  Answer:   Neva SeatGREENE, JEFFREY R [[id number]]  ? predniSONE (STERAPRED UNI-PAK 21 TAB) 10 MG (21) TBPK tablet  ?  Sig: Take per package instructions. Do not skip doses. Finish entire supply.  ?  Dispense:  1 each  ?  Refill:  0  ?  Order Specific Question:   Supervising Provider  ?  Answer:   Neva SeatGREENE, JEFFREY R [[id number]]  ? montelukast (SINGULAIR) 10 MG tablet  ?  Sig: Take 1 tablet (10 mg total) by mouth at bedtime.  ?  Dispense:  30 tablet  ?  Refill:  3  ?  Order Specific Question:   Supervising Provider  ?  Answer:   Neva SeatGREENE, JEFFREY R [[id number]]  ? azelastine (ASTELIN) 0.1 % nasal spray  ?  Sig: Place 1 spray into both nostrils 2 (two) times daily. Use in each nostril as directed  ?  Dispense:  30 mL  ?  Refill:  12  ?  Order Specific Question:   Supervising Provider  ?  Answer:   Neva SeatGREENE, JEFFREY R [[id number]]  ? ? ?Follow-up: Return if symptoms worsen or fail to improve.  ? ?PLAN ?Meds as above. Discussed risks, benefits, and AE to each. Will monitor effect. ?Fmla paperwork completed and returned to pt. Copy kept on file ?Patient encouraged to call clinic with any questions, comments, or concerns. ? ?Janeece Ageeichard [place] Lapaglia, NP ?

## 2021-12-14 NOTE — Telephone Encounter (Signed)
Patient needs question 5 answered. ?

## 2021-12-15 NOTE — Telephone Encounter (Signed)
Returned to you yesterday  ? ?Thanks, ? ?Rich

## 2021-12-17 ENCOUNTER — Encounter: Payer: Self-pay | Admitting: Neurology

## 2022-05-05 ENCOUNTER — Ambulatory Visit: Payer: BC Managed Care – PPO | Admitting: Psychiatry

## 2022-05-06 ENCOUNTER — Emergency Department (HOSPITAL_BASED_OUTPATIENT_CLINIC_OR_DEPARTMENT_OTHER): Payer: BC Managed Care – PPO

## 2022-05-06 ENCOUNTER — Emergency Department (HOSPITAL_BASED_OUTPATIENT_CLINIC_OR_DEPARTMENT_OTHER)
Admission: EM | Admit: 2022-05-06 | Discharge: 2022-05-06 | Disposition: A | Discharge status: Home / Self Care | Payer: BC Managed Care – PPO | Attending: Emergency Medicine | Admitting: Emergency Medicine

## 2022-05-06 ENCOUNTER — Other Ambulatory Visit: Payer: Self-pay

## 2022-05-06 ENCOUNTER — Ambulatory Visit: Payer: BC Managed Care – PPO | Admitting: Physician Assistant

## 2022-05-06 ENCOUNTER — Encounter: Payer: Self-pay | Admitting: Physician Assistant

## 2022-05-06 VITALS — BP 120/80 | HR 86 | Temp 98.5°F | Ht 61.0 in | Wt 157.2 lb

## 2022-05-06 DIAGNOSIS — R1033 Periumbilical pain: Secondary | ICD-10-CM

## 2022-05-06 DIAGNOSIS — R197 Diarrhea, unspecified: Secondary | ICD-10-CM | POA: Diagnosis not present

## 2022-05-06 LAB — CBC
HCT: 40.8 % (ref 36.0–46.0)
Hemoglobin: 13.7 g/dL (ref 12.0–15.0)
MCH: 28.6 pg (ref 26.0–34.0)
MCHC: 33.6 g/dL (ref 30.0–36.0)
MCV: 85.2 fL (ref 80.0–100.0)
Platelets: 215 10*3/uL (ref 150–400)
RBC: 4.79 MIL/uL (ref 3.87–5.11)
RDW: 12.3 % (ref 11.5–15.5)
WBC: 6.1 10*3/uL (ref 4.0–10.5)
nRBC: 0 % (ref 0.0–0.2)

## 2022-05-06 LAB — URINALYSIS, ROUTINE W REFLEX MICROSCOPIC
Bilirubin Urine: NEGATIVE
Glucose, UA: NEGATIVE mg/dL
Hgb urine dipstick: NEGATIVE
Ketones, ur: NEGATIVE mg/dL
Leukocytes,Ua: NEGATIVE
Nitrite: NEGATIVE
Protein, ur: 30 mg/dL — AB
Specific Gravity, Urine: 1.027 (ref 1.005–1.030)
pH: 5.5 (ref 5.0–8.0)

## 2022-05-06 LAB — COMPREHENSIVE METABOLIC PANEL
ALT: 26 U/L (ref 0–44)
AST: 21 U/L (ref 15–41)
Albumin: 4.8 g/dL (ref 3.5–5.0)
Alkaline Phosphatase: 46 U/L (ref 38–126)
Anion gap: 7 (ref 5–15)
BUN: 11 mg/dL (ref 6–20)
CO2: 26 mmol/L (ref 22–32)
Calcium: 9.7 mg/dL (ref 8.9–10.3)
Chloride: 103 mmol/L (ref 98–111)
Creatinine, Ser: 0.88 mg/dL (ref 0.44–1.00)
GFR, Estimated: >60 mL/min (ref >60)
Glucose, Bld: 84 mg/dL (ref 70–99)
Potassium: 3.9 mmol/L (ref 3.5–5.1)
Sodium: 136 mmol/L (ref 135–145)
Total Bilirubin: 0.8 mg/dL (ref 0.3–1.2)
Total Protein: 8.4 g/dL — ABNORMAL HIGH (ref 6.5–8.1)

## 2022-05-06 LAB — LIPASE, BLOOD: Lipase: 25 U/L (ref 11–51)

## 2022-05-06 LAB — PREGNANCY, URINE: Preg Test, Ur: NEGATIVE

## 2022-05-06 MED ORDER — IOHEXOL 300 MG/ML  SOLN
100.0000 mL | Freq: Once | INTRAMUSCULAR | Status: AC | PRN
  Administered 2022-05-06: 80 mL via INTRAVENOUS

## 2022-05-06 MED ORDER — ONDANSETRON HCL 4 MG/2ML IJ SOLN
4.0000 mg | Freq: Once | INTRAMUSCULAR | Status: AC
  Administered 2022-05-06: 4 mg via INTRAVENOUS
  Filled 2022-05-06: qty 2

## 2022-05-06 MED ORDER — KETOROLAC TROMETHAMINE 15 MG/ML IJ SOLN: 15.0000 mg | Freq: Once | INTRAMUSCULAR | Status: DC

## 2022-05-06 MED ORDER — MORPHINE SULFATE (PF) 4 MG/ML IV SOLN
4.0000 mg | INTRAVENOUS | Status: AC
  Administered 2022-05-06: 4 mg via INTRAVENOUS
  Filled 2022-05-06: qty 1

## 2022-05-06 MED ORDER — LIDOCAINE VISCOUS HCL 2 % MT SOLN
15.0000 mL | Freq: Once | OROMUCOSAL | Status: AC
  Administered 2022-05-06: 15 mL via ORAL
  Filled 2022-05-06: qty 15

## 2022-05-06 MED ORDER — FAMOTIDINE IN NACL 20-0.9 MG/50ML-% IV SOLN
20.0000 mg | Freq: Once | INTRAVENOUS | Status: AC
  Administered 2022-05-06: 20 mg via INTRAVENOUS
  Filled 2022-05-06: qty 50

## 2022-05-06 MED ORDER — LACTATED RINGERS IV BOLUS
1000.0000 mL | Freq: Once | INTRAVENOUS | Status: AC
  Administered 2022-05-06: 1000 mL via INTRAVENOUS

## 2022-05-06 MED ORDER — ALUM & MAG HYDROXIDE-SIMETH 200-200-20 MG/5ML PO SUSP
30.0000 mL | Freq: Once | ORAL | Status: AC
  Administered 2022-05-06: 30 mL via ORAL
  Filled 2022-05-06: qty 30

## 2022-05-06 MED ORDER — ACETAMINOPHEN 325 MG PO TABS
650.0000 mg | ORAL_TABLET | Freq: Once | ORAL | Status: AC
  Administered 2022-05-06: 650 mg via ORAL
  Filled 2022-05-06: qty 2

## 2022-05-06 NOTE — Discharge Instructions (Signed)
Today you were seen in the emergency department for your abdominal pain and diarrhea.    In the emergency department you had a CT scan of your abdomen which is reassuring along with lab work.    Follow-up with your primary doctor in 2-3 days regarding your visit.    Return immediately to the emergency department if you experience any of the following: Worsening pain, vomiting, fainting, or any other concerning symptoms.    Thank you for visiting our Emergency Department. It was a pleasure taking care of you today.

## 2022-05-06 NOTE — ED Triage Notes (Signed)
Patient arrives with complaints of worsening abdominal pain and diarrhea x3 days.

## 2022-05-06 NOTE — ED Notes (Signed)
Pt has tolerated PO liquids and popcorn -- states no nausea; non-radiating epigastric pain hasn't worsened but is still a constant 5-6/10 described as a 'contraction' with pressure - Dr. Eloise Harman notified via secure chat of patient response to PO challenge and ongoing pain complaint.

## 2022-05-06 NOTE — ED Notes (Signed)
Pt reports pain has significantly improved s/p GI Cocktail.  Pt agreeable with d/c plan as discussed by provider- this nurse has verbally reinforced d/c instructions and provided pt with written copy - pt acknowledges verbal understanding and denies any additional questions, concerns, needs- pt ambulatory independently at d/c with steady gait; vitals stable; no distress.

## 2022-05-06 NOTE — ED Provider Notes (Signed)
[facility] EMERGENCY DEPT Provider Note   CSN: [id number] Arrival date & time: 05/06/22  1603     History {Add pertinent medical, surgical, social history, OB history to HPI:1} Chief Complaint  Patient presents with   Abdominal Pain   Diarrhea    [name] [name] is a 36 y.o. female.  36 yo F with hx of migraines who presents to the emergency department with abdominal pain and diarrhea. Has been present since Tuesday. Attributed to possibly eating something on Monday but was not sure. Has persisted and has been having ~5 loose stools/day but today slowed to 2 per day. Watery and no blood or melena. No recent travel, abx, hospitalizations, or hx of c diff. No sick contacts. Pain in periumbilical and feels like a contraction. Lasts for a few seconds and goes away but has gotten persistently worse prompting her to come in for evaluation today.   Seen in clinic and was noted to have guarding on their exam and she was referred in for CT abdomen and pelvis.    Abdominal Pain Associated symptoms: diarrhea   Associated symptoms: no chills, no dysuria, no fever, no vaginal bleeding and no vaginal discharge   Diarrhea Associated symptoms: abdominal pain   Associated symptoms: no chills and no fever        Home Medications Prior to Admission medications   Medication Sig Start Date End Date Taking? Authorizing Provider  azelastine (ASTELIN) 0.1 % nasal spray Place 1 spray into both nostrils 2 (two) times daily. Use in each nostril as directed 12/08/21   Janeece Agee, NP  cetirizine (ZYRTEC) 10 MG tablet Take 1 tablet (10 mg total) by mouth daily. 12/08/21   Janeece Agee, NP  diclofenac (CATAFLAM) 50 MG tablet Take 1 tablet (50 mg total) by mouth 3 (three) times daily as needed (for migraine headache). 11/04/21   Glean Salvo, NP  Fremanezumab-vfrm (AJOVY) 225 MG/1.5ML SOSY Inject 225 mg into the skin every 30 (thirty) days. 11/04/21   Glean Salvo, NP  frovatriptan (FROVA)  2.5 MG tablet Take 1 tablet (2.5 mg total) by mouth as needed for migraine. If recurs, may repeat after 2 hours. Max of 2 tabs in 24 hours. 11/04/21   Glean Salvo, NP  ketoconazole (NIZORAL) 2 % cream Apply 1 application topically daily. 09/07/20   Just, Azalee Course, FNP  montelukast (SINGULAIR) 10 MG tablet Take 1 tablet (10 mg total) by mouth at bedtime. 12/08/21   Janeece Agee, NP  predniSONE (STERAPRED UNI-PAK 21 TAB) 10 MG (21) TBPK tablet Take per package instructions. Do not skip doses. Finish entire supply. Patient not taking: Reported on 05/06/2022 12/08/21   Janeece Agee, NP  topiramate (TOPAMAX) 50 MG tablet Take 3 tablets (150 mg total) by mouth at bedtime. 11/04/21   Glean Salvo, NP  TRIAMCINOLONE ACETONIDE, TOP, (TRITOCIN) 0.05 % OINT Apply 1 application topically daily. 09/07/20   Just, Azalee Course, FNP      Allergies    Pollen extract    Review of Systems   Review of Systems  Constitutional:  Negative for chills and fever.  Gastrointestinal:  Positive for abdominal pain and diarrhea.  Genitourinary:  Negative for dysuria, frequency, vaginal bleeding and vaginal discharge.  Musculoskeletal:  Negative for back pain.    Physical Exam Updated Vital Signs BP 130/89 (BP Location: Right Arm)   Pulse 80   Temp 98.2 F (36.8 C)   Resp 18   Ht 5\' 1"  (1.549 m)  Wt 71.2 kg   LMP 04/22/2022   SpO2 99%   BMI 29.66 kg/m  Physical Exam  ED Results / Procedures / Treatments   Labs (all labs ordered are listed, but only abnormal results are displayed) Labs Reviewed  URINALYSIS, ROUTINE W REFLEX MICROSCOPIC - Abnormal; Notable for the following components:      Result Value   APPearance HAZY (*)    Protein, ur 30 (*)    All other components within normal limits  CBC  PREGNANCY, URINE  LIPASE, BLOOD  COMPREHENSIVE METABOLIC PANEL    EKG None  Radiology No results found.  Procedures Procedures  {Document cardiac monitor, telemetry assessment procedure when  appropriate:1}  Medications Ordered in ED Medications - No data to display  ED Course/ Medical Decision Making/ A&P                           Medical Decision Making Amount and/or Complexity of Data Reviewed Labs: ordered.   ***  {Document critical care time when appropriate:1} {Document review of labs and clinical decision tools ie heart score, Chads2Vasc2 etc:1}  {Document your independent review of radiology images, and any outside records:1} {Document your discussion with family members, caretakers, and with consultants:1} {Document social determinants of health affecting pt's care:1} {Document your decision making why or why not admission, treatments were needed:1} Final Clinical Impression(s) / ED Diagnoses Final diagnoses:  None    Rx / DC Orders ED Discharge Orders     None

## 2022-05-06 NOTE — Progress Notes (Signed)
[name] [name] is a 36 y.o. female here for a new problem of abdominal pain.   History of Present Illness:   Chief Complaint  Patient presents with   Abdominal Pain    HPI  Abdominal Pain Patient complain of severe abdominal pain that has been onset for 4 days. She described this as "contractions pain". Located in mid/center abdomen. Feels like her abdomen has been tightening. States she has been having frequent BM about 4-5 times daily. States she usually has a BM about 1-2 times daily. She does admit going out this Monday and had food from outside. However, she does not think this could be related. Has not had improvement in her abdominal pain after having BM. No vaginal discharge. No pelvic pain. Denies concerns for pregnancy. Denies: unintentional weight loss, n/v/d. No constipation or diarrhea.   Past Medical History:  Diagnosis Date   Allergy    Common migraine with intractable migraine 03/16/2018   Depression    Gonorrhea    Headache(784.0)    Infection    UTI   Shoulder dystocia, delivered, current hospitalization 06/14/2014   Vaginal Pap smear, abnormal    f/u ok     Social History   Tobacco Use   Smoking status: Former   Smokeless tobacco: Never  Building services engineer Use: Never used  Substance Use Topics   Alcohol use: Yes    Comment: occasionally   Drug use: Yes    Types: Marijuana    Comment: occasionally    Past Surgical History:  Procedure Laterality Date   CESAREAN SECTION N/A    [employer]reesia 06/06/2020   CESAREAN SECTION WITH BILATERAL TUBAL LIGATION Bilateral 07/20/2016   Procedure: REPEAT CESAREAN SECTION WITH BILATERAL TUBAL LIGATION;  Surgeon: Jaymes Graff, MD;  Location: [facility];  Service: Obstetrics;  Laterality: Bilateral;  Provider requests RNFA -ap    Family History  Problem Relation Age of Onset   Diabetes Mother    Hearing loss Neg Hx     Allergies  Allergen Reactions   Pollen Extract Itching    Current Medications:    Current Outpatient Medications:    azelastine (ASTELIN) 0.1 % nasal spray, Place 1 spray into both nostrils 2 (two) times daily. Use in each nostril as directed, Disp: 30 mL, Rfl: 12   cetirizine (ZYRTEC) 10 MG tablet, Take 1 tablet (10 mg total) by mouth daily., Disp: 30 tablet, Rfl: 11   diclofenac (CATAFLAM) 50 MG tablet, Take 1 tablet (50 mg total) by mouth 3 (three) times daily as needed (for migraine headache)., Disp: 30 tablet, Rfl: 1   Fremanezumab-vfrm (AJOVY) 225 MG/1.5ML SOSY, Inject 225 mg into the skin every 30 (thirty) days., Disp: 1.5 mL, Rfl: 11   frovatriptan (FROVA) 2.5 MG tablet, Take 1 tablet (2.5 mg total) by mouth as needed for migraine. If recurs, may repeat after 2 hours. Max of 2 tabs in 24 hours., Disp: 10 tablet, Rfl: 4   ketoconazole (NIZORAL) 2 % cream, Apply 1 application topically daily., Disp: 15 g, Rfl: 0   montelukast (SINGULAIR) 10 MG tablet, Take 1 tablet (10 mg total) by mouth at bedtime., Disp: 30 tablet, Rfl: 3   topiramate (TOPAMAX) 50 MG tablet, Take 3 tablets (150 mg total) by mouth at bedtime., Disp: 270 tablet, Rfl: 3   TRIAMCINOLONE ACETONIDE, TOP, (TRITOCIN) 0.05 % OINT, Apply 1 application topically daily., Disp: 110 g, Rfl: 0   predniSONE (STERAPRED UNI-PAK 21 TAB) 10 MG (21) TBPK tablet, Take per  package instructions. Do not skip doses. Finish entire supply. (Patient not taking: Reported on 05/06/2022), Disp: 1 each, Rfl: 0   Review of Systems:   ROS Negative unless otherwise specified per HPI.   Vitals:   Vitals:   05/06/22 1529  BP: 120/80  Pulse: 86  Temp: 98.5 F (36.9 C)  SpO2: 96%  Weight: 157 lb 3.2 oz (71.3 kg)  Height: 5\' 1"  (1.549 m)     Body mass index is 29.7 kg/m.  Physical Exam:   Physical Exam Vitals and nursing note reviewed.  Constitutional:      General: She is not in acute distress.    Appearance: She is well-developed. She is ill-appearing. She is not toxic-appearing.  Cardiovascular:     Rate and Rhythm:  Normal rate and regular rhythm.     Pulses: Normal pulses.     Heart sounds: Normal heart sounds, S1 normal and S2 normal.  Pulmonary:     Effort: Pulmonary effort is normal.     Breath sounds: Normal breath sounds.  Abdominal:     General: Abdomen is flat. Bowel sounds are normal.     Palpations: Abdomen is soft.     Tenderness: There is abdominal tenderness. There is guarding and rebound. There is no right CVA tenderness or left CVA tenderness.  Skin:    General: Skin is warm and dry.  Neurological:     Mental Status: She is alert.     GCS: GCS eye subscore is 4. GCS verbal subscore is 5. GCS motor subscore is 6.  Psychiatric:        Speech: Speech normal.        Behavior: Behavior normal. Behavior is cooperative.     Assessment and Plan:   Periumbilical abdominal pain Due to severity of symptoms, I discussed with patient that she would best be served in ER setting as I am unable to obtain STAT imaging at this hour of the day -- she is agreeable and is going to proceed to [facility]  as a scribe for [employer], PA.,have documented all relevant documentation on the behalf of [employer], PA,as directed by  Jarold Motto, PA while in the presence of Jarold Motto, Jarold Motto.   I, [place], Jarold Motto, have reviewed all documentation for this visit. The documentation on 05/06/22 for the exam, diagnosis, procedures, and orders are all accurate and complete.   07/06/22, PA-C

## 2022-05-06 NOTE — ED Notes (Signed)
Food and drink provided for PO challenge.

## 2022-05-27 IMAGING — DX DG CHEST 1V PORT
1 series · 1 of 1 positions shown · non-contrast
Comparison: Chest radiograph 01/06/2018

CLINICAL DATA: Patient presented to ED with c/o congestion and
cough x 4 days ago and sob onset today. Former smoker.

EXAM:
PORTABLE CHEST 1 VIEW

[chest ap]
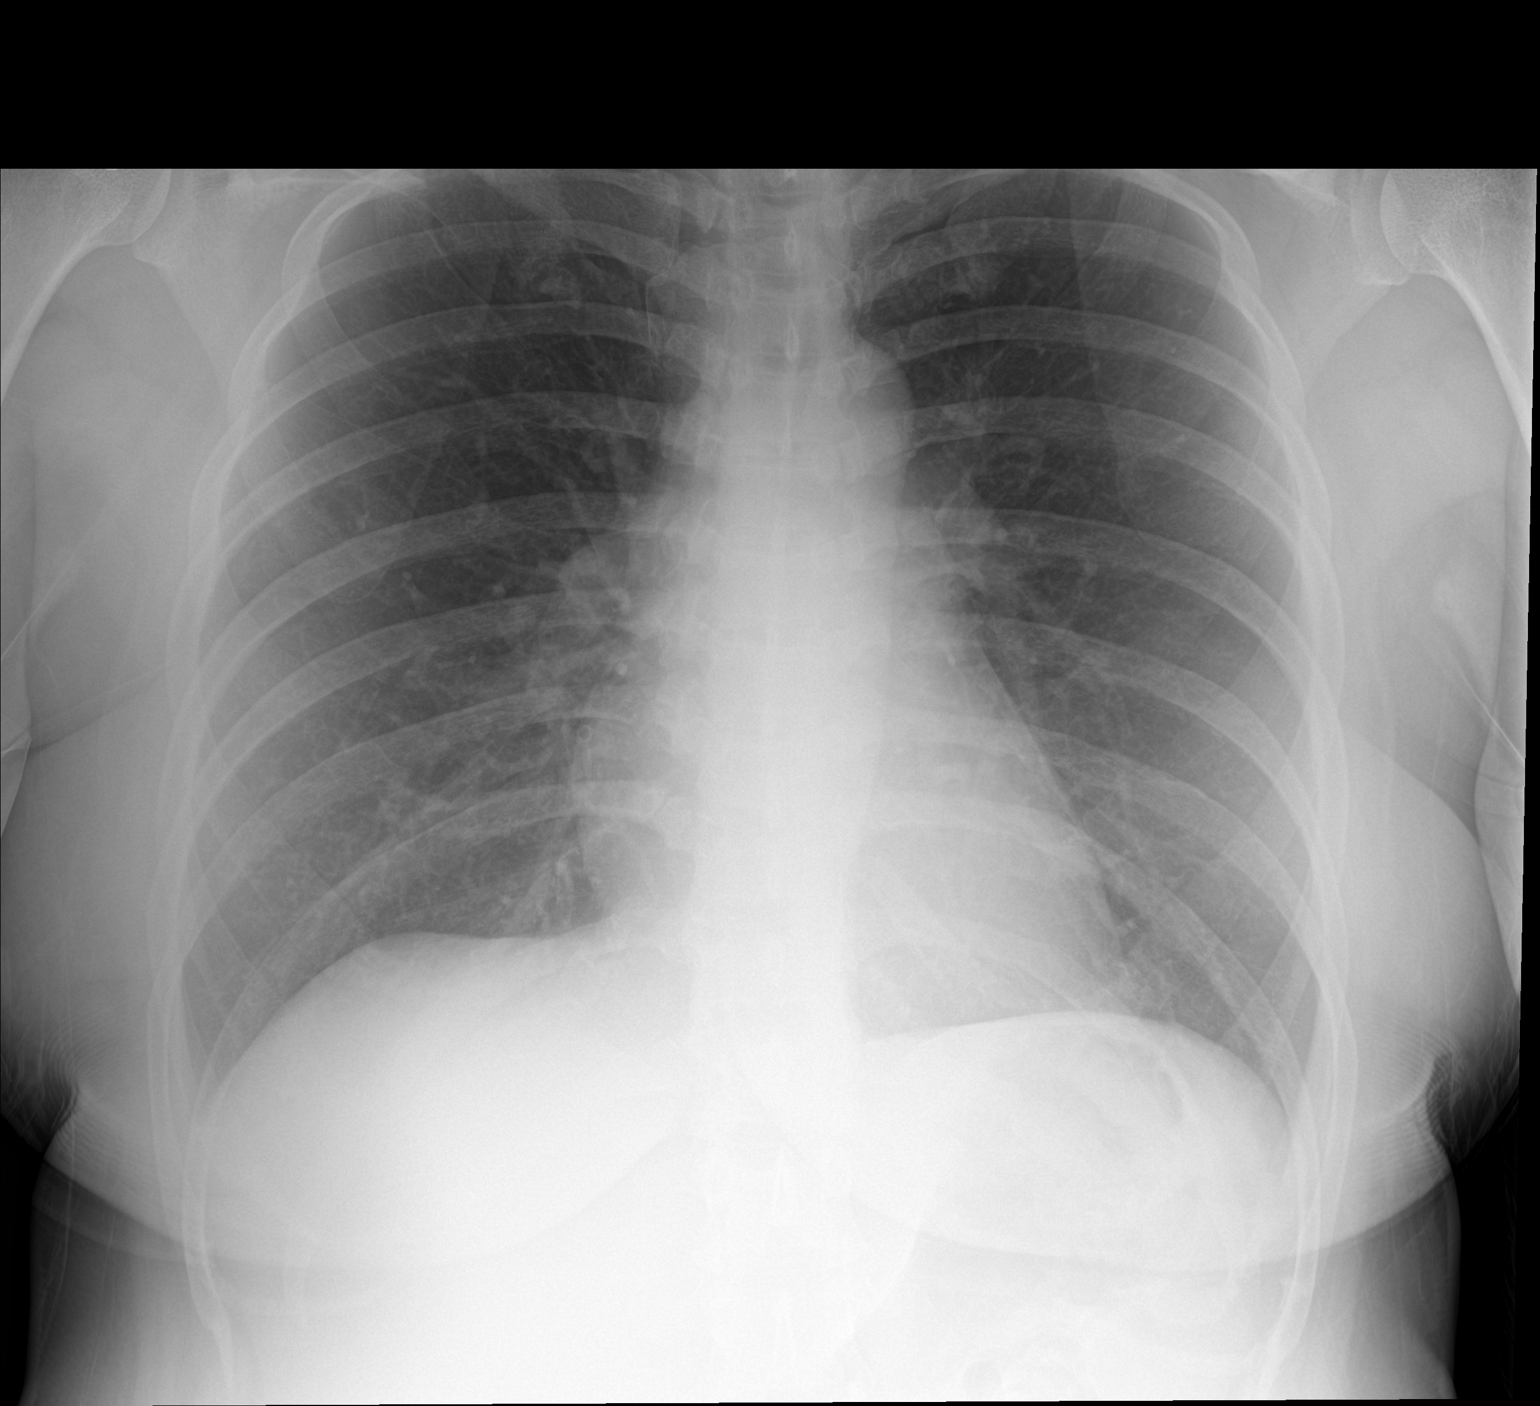

[1 of 1 positions shown; findings below may reference images not displayed]

FINDINGS: The heart size and mediastinal contours are within normal limits.
Both lungs are clear. No pneumothorax or significant pleural
effusion. The visualized skeletal structures are unremarkable.
IMPRESSION: No acute cardiopulmonary finding.

## 2022-05-30 ENCOUNTER — Encounter: Payer: Self-pay | Admitting: Psychiatry

## 2022-05-30 ENCOUNTER — Ambulatory Visit: Payer: BC Managed Care – PPO | Admitting: Psychiatry

## 2022-07-25 ENCOUNTER — Ambulatory Visit: Payer: BC Managed Care – PPO | Admitting: Psychiatry

## 2022-08-22 ENCOUNTER — Encounter: Payer: Self-pay | Admitting: Physician Assistant

## 2022-08-22 ENCOUNTER — Ambulatory Visit (INDEPENDENT_AMBULATORY_CARE_PROVIDER_SITE_OTHER): Payer: BC Managed Care – PPO | Admitting: Physician Assistant

## 2022-08-22 VITALS — BP 120/88 | HR 80 | Temp 98.2°F | Ht 61.0 in | Wt 157.4 lb

## 2022-08-22 DIAGNOSIS — J029 Acute pharyngitis, unspecified: Secondary | ICD-10-CM | POA: Diagnosis not present

## 2022-08-22 LAB — POCT RAPID STREP A (OFFICE): Rapid Strep A Screen: POSITIVE — AB

## 2022-08-22 MED ORDER — AMOXICILLIN 500 MG PO CAPS: 500.0000 mg | ORAL_CAPSULE | Freq: Two times a day (BID) | ORAL | 0 refills | Status: DC

## 2022-08-22 NOTE — Patient Instructions (Signed)
It was great to see you!  Start amoxicillin for your strep throat  Follow-up if any concerns -- we will send your paperwork in today as discussed  Take care,  Jarold Motto PA-C

## 2022-08-22 NOTE — Progress Notes (Signed)
[name] [name] is a 36 y.o. female here for a follow up of a pre-existing problem.  History of Present Illness:   Chief Complaint  Patient presents with   Sore Throat    Pt c/o sore throat started Thursday, cough started on Sunday, non-productive. Denies fever or chills.    HPI  Sore throat  Sore throat onset a few days ago, and cough started yesterday. States it felt like she has a rock in her throat. Complains of pressure in her nasal airways but this is normal due to allergies.   Denies any fever or chills; difficulty breathing/controlling secretions Kids and family members are also sick  Past Medical History:  Diagnosis Date   Allergy    Common migraine with intractable migraine 03/16/2018   Depression    Gonorrhea    Headache(784.0)    Infection    UTI   Shoulder dystocia, delivered, current hospitalization 06/14/2014   Vaginal Pap smear, abnormal    f/u ok     Social History   Tobacco Use   Smoking status: Former   Smokeless tobacco: Never  Building services engineer Use: Never used  Substance Use Topics   Alcohol use: Yes    Comment: occasionally   Drug use: Yes    Types: Marijuana    Comment: occasionally    Past Surgical History:  Procedure Laterality Date   CESAREAN SECTION N/A    [employer]reesia 06/06/2020   CESAREAN SECTION WITH BILATERAL TUBAL LIGATION Bilateral 07/20/2016   Procedure: REPEAT CESAREAN SECTION WITH BILATERAL TUBAL LIGATION;  Surgeon: Jaymes Graff, MD;  Location: [facility];  Service: Obstetrics;  Laterality: Bilateral;  Provider requests RNFA -ap    Family History  Problem Relation Age of Onset   Diabetes Mother    Hearing loss Neg Hx     Allergies  Allergen Reactions   Pollen Extract Itching    Current Medications:   Current Outpatient Medications:    azelastine (ASTELIN) 0.1 % nasal spray, Place 1 spray into both nostrils 2 (two) times daily. Use in each nostril as directed, Disp: 30 mL, Rfl: 12   cetirizine (ZYRTEC) 10  MG tablet, Take 1 tablet (10 mg total) by mouth daily., Disp: 30 tablet, Rfl: 11   diclofenac (CATAFLAM) 50 MG tablet, Take 1 tablet (50 mg total) by mouth 3 (three) times daily as needed (for migraine headache)., Disp: 30 tablet, Rfl: 1   Fremanezumab-vfrm (AJOVY) 225 MG/1.5ML SOSY, Inject 225 mg into the skin every 30 (thirty) days., Disp: 1.5 mL, Rfl: 11   frovatriptan (FROVA) 2.5 MG tablet, Take 1 tablet (2.5 mg total) by mouth as needed for migraine. If recurs, may repeat after 2 hours. Max of 2 tabs in 24 hours., Disp: 10 tablet, Rfl: 4   montelukast (SINGULAIR) 10 MG tablet, Take 1 tablet (10 mg total) by mouth at bedtime., Disp: 30 tablet, Rfl: 3   topiramate (TOPAMAX) 50 MG tablet, Take 3 tablets (150 mg total) by mouth at bedtime., Disp: 270 tablet, Rfl: 3   Review of Systems:   Review of Systems  Constitutional:  Negative for chills, fever, malaise/fatigue and weight loss.  HENT:  Positive for sore throat. Negative for hearing loss and sinus pain.   Respiratory:  Positive for cough. Negative for hemoptysis.   Cardiovascular:  Negative for chest pain, palpitations, leg swelling and PND.  Gastrointestinal:  Negative for abdominal pain, constipation, diarrhea, heartburn, nausea and vomiting.  Genitourinary:  Negative for dysuria, frequency and urgency.  Musculoskeletal:  Negative for back pain, myalgias and neck pain.  Skin:  Negative for itching and rash.  Neurological:  Negative for dizziness, tingling, seizures and headaches.  Endo/Heme/Allergies:  Negative for polydipsia.  Psychiatric/Behavioral:  Negative for depression. The patient is not nervous/anxious.     Vitals:   Vitals:   08/22/22 1146  BP: 120/88  Pulse: 80  Temp: 98.2 F (36.8 C)  TempSrc: Temporal  SpO2: 99%  Weight: 157 lb 6.1 oz (71.4 kg)  Height: 5\' 1"  (1.549 m)     Body mass index is 29.74 kg/m.  Physical Exam:   Physical Exam Vitals and nursing note reviewed.  Constitutional:      General: She  is not in acute distress.    Appearance: She is well-developed. She is not ill-appearing or toxic-appearing.  HENT:     Head: Normocephalic and atraumatic.     Right Ear: Tympanic membrane, ear canal and external ear normal. Tympanic membrane is not erythematous, retracted or bulging.     Left Ear: Tympanic membrane, ear canal and external ear normal. Tympanic membrane is not erythematous, retracted or bulging.     Nose: Nose normal.     Right Sinus: No maxillary sinus tenderness or frontal sinus tenderness.     Left Sinus: No maxillary sinus tenderness or frontal sinus tenderness.     Mouth/Throat:     Pharynx: Uvula midline. Posterior oropharyngeal erythema present.     Tonsils: 1+ on the right. 1+ on the left.  Eyes:     General: Lids are normal.     Conjunctiva/sclera: Conjunctivae normal.  Neck:     Trachea: Trachea normal.  Cardiovascular:     Rate and Rhythm: Normal rate and regular rhythm.     Pulses: Normal pulses.     Heart sounds: Normal heart sounds, S1 normal and S2 normal.  Pulmonary:     Effort: Pulmonary effort is normal.     Breath sounds: Normal breath sounds. No decreased breath sounds, wheezing, rhonchi or rales.  Lymphadenopathy:     Cervical: No cervical adenopathy.  Skin:    General: Skin is warm and dry.  Neurological:     Mental Status: She is alert.     GCS: GCS eye subscore is 4. GCS verbal subscore is 5. GCS motor subscore is 6.  Psychiatric:        Speech: Speech normal.        Behavior: Behavior normal. Behavior is cooperative.    Results for orders placed or performed in visit on 08/22/22  POCT rapid strep A  Result Value Ref Range   Rapid Strep A Screen Positive (A) Negative     Assessment and Plan:   Sore throat Strep test positive Start amoxicillin Work note/FMLA completed Follow-up if lack of improvement or any concerns Push fluids, rest NSAIDS for pain/inflammation  I,Moesha Myer,acting as a Education administrator for [employer], PA.,have  documented all relevant documentation on the behalf of [name], PA,as directed by  [name], PA while in the presence of [name], [name].   I, [name], [name], have reviewed all documentation for this visit. The documentation on 08/22/22 for the exam, diagnosis, procedures, and orders are all accurate and complete.  [name] PA-C

## 2022-10-11 ENCOUNTER — Ambulatory Visit: Payer: BC Managed Care – PPO | Admitting: Physician Assistant

## 2022-11-01 NOTE — Progress Notes (Unsigned)
   CC:  headaches  Follow-up Visit  Last visit: 11/04/21  Brief HPI: 37 year old female who follows in clinic for migraines. At her last visit she was continued to Topamax and Ajovy for prevention, and frovatriptan for rescue.  Interval History: Headaches***   Headache days per month: *** Migraine days per month*** Headache free days per month: ***  Current Headache Regimen: Preventative: Topamax 150 mg QHS, Ajovy 225 mg monthly Abortive: frovatriptan 2.5 mg PRN, diclofenac 50 mg PRN   Prior Therapies                                  Preventive: Nortriptyline - drowsiness Topamax 150 mg QHS Ajovy 225 mg monthly  Rescue: Frovatriptan 2.5 mg PRN Diclofenac 50 mg PRN  Physical Exam:   Vital Signs: There were no vitals taken for this visit. GENERAL:  well appearing, in no acute distress, alert  SKIN:  Color, texture, turgor normal. No rashes or lesions HEAD:  Normocephalic/atraumatic. RESP: normal respiratory effort MSK:  No gross joint deformities.   NEUROLOGICAL: Mental Status: Alert, oriented to person, place and time, Follows commands, and Speech fluent and appropriate. Cranial Nerves: PERRL, face symmetric, no dysarthria, hearing grossly intact Motor: moves all extremities equally Gait: normal-based.  IMPRESSION: ***  PLAN: ***   Follow-up: ***  I spent a total of *** minutes on the date of the service. Headache education was done. Discussed lifestyle modification including increased oral hydration, decreased caffeine, exercise and stress management. Discussed treatment options including preventive and acute medications, natural supplements, and infusion therapy. Discussed medication overuse headache and to limit use of acute treatments to no more than 2 days/week or 10 days/month. Discussed medication side effects, adverse reactions and drug interactions. Written educational materials and patient instructions outlining all of the above were given.  Genia Harold, MD

## 2022-11-02 ENCOUNTER — Ambulatory Visit (INDEPENDENT_AMBULATORY_CARE_PROVIDER_SITE_OTHER): Payer: BC Managed Care – PPO | Admitting: Psychiatry

## 2022-11-02 VITALS — BP 136/80 | HR 69 | Ht 61.0 in | Wt 158.4 lb

## 2022-11-02 DIAGNOSIS — G43019 Migraine without aura, intractable, without status migrainosus: Secondary | ICD-10-CM | POA: Diagnosis not present

## 2022-11-02 MED ORDER — FROVATRIPTAN SUCCINATE 2.5 MG PO TABS: 2.5000 mg | ORAL_TABLET | ORAL | 11 refills | Status: DC | PRN

## 2022-11-02 MED ORDER — AJOVY 225 MG/1.5ML ~~LOC~~ SOSY: 225.0000 mg | PREFILLED_SYRINGE | SUBCUTANEOUS | 11 refills | Status: DC

## 2022-11-02 MED ORDER — TOPIRAMATE 50 MG PO TABS: 150.0000 mg | ORAL_TABLET | Freq: Every day | ORAL | 4 refills | Status: DC

## 2022-11-02 MED ORDER — DICLOFENAC POTASSIUM 50 MG PO TABS: 50.0000 mg | ORAL_TABLET | Freq: Three times a day (TID) | ORAL | 11 refills | Status: AC | PRN

## 2022-12-13 NOTE — Progress Notes (Signed)
[name] [name] is a 37 y.o. female here for a new problem.  History of Present Illness:   Chief Complaint  Patient presents with   seasonal allergies    Needing refill for singular and flonase Also wanting FMLA for this, but she does not apply for FMLA at work yet. States she will make an appt when she does.    Hearing Problem    Right ear, hearing loss    HPI  Hearing Loss Right Ear Reports reduced hearing in right ear. Has popping in ears that she believes is related to sneezing and clearing throat.  Allergies Experiences symptoms year-round that are worse in warmer weather. Sneezing and runny nose are the main symptoms. She has nosebleeds in the morning. Stopped taking Singulair in winter 2023.  Started Zyrtec OTC a week or two ago. Requesting refills for Singulair and Zyrtec with FMLA. Uses Flonase in the summer. Her job involves significant talking and when allergies are uncontrolled she has to miss work.   Past Medical History:  Diagnosis Date   Allergy    Common migraine with intractable migraine 03/16/2018   Depression    Gonorrhea    Headache(784.0)    Infection    UTI   Shoulder dystocia, delivered, current hospitalization 06/14/2014   Vaginal Pap smear, abnormal    f/u ok     Social History   Tobacco Use   Smoking status: Former   Smokeless tobacco: Never  Scientific laboratory technician Use: Never used  Substance Use Topics   Alcohol use: Yes    Comment: occasionally   Drug use: Yes    Types: Marijuana    Comment: occasionally    Past Surgical History:  Procedure Laterality Date   CESAREAN SECTION N/A    [employer] 06/06/2020   CESAREAN SECTION WITH BILATERAL TUBAL LIGATION Bilateral 07/20/2016   Procedure: REPEAT CESAREAN SECTION WITH BILATERAL TUBAL LIGATION;  Surgeon: Crawford Givens, MD;  Location: [facility];  Service: Obstetrics;  Laterality: Bilateral;  Provider requests RNFA -ap    Family History  Problem Relation Age of Onset    Diabetes Mother    Hearing loss Neg Hx     Allergies  Allergen Reactions   Pollen Extract Itching    Current Medications:   Current Outpatient Medications:    cetirizine (ZYRTEC) 10 MG tablet, Take 1 tablet (10 mg total) by mouth daily., Disp: 30 tablet, Rfl: 11   diclofenac (CATAFLAM) 50 MG tablet, Take 1 tablet (50 mg total) by mouth 3 (three) times daily as needed (for migraine headache)., Disp: 30 tablet, Rfl: 11   Fremanezumab-vfrm (AJOVY) 225 MG/1.5ML SOSY, Inject 225 mg into the skin every 30 (thirty) days., Disp: 1.5 mL, Rfl: 11   frovatriptan (FROVA) 2.5 MG tablet, Take 1 tablet (2.5 mg total) by mouth as needed for migraine. If recurs, may repeat after 2 hours. Max of 2 tabs in 24 hours., Disp: 10 tablet, Rfl: 11   montelukast (SINGULAIR) 10 MG tablet, Take 1 tablet (10 mg total) by mouth at bedtime., Disp: 30 tablet, Rfl: 3   topiramate (TOPAMAX) 50 MG tablet, Take 3 tablets (150 mg total) by mouth at bedtime., Disp: 270 tablet, Rfl: 4   Review of Systems:   Review of Systems  Constitutional:  Negative for fever and malaise/fatigue.  HENT:  Positive for ear pain (Popping, right), hearing loss (Right) and nosebleeds (Morning). Negative for congestion.        (+) Sneezing (+) Rhinorrhea  Eyes:  Negative for blurred vision.  Respiratory:  Negative for cough and shortness of breath.   Cardiovascular:  Negative for chest pain, palpitations and leg swelling.  Gastrointestinal:  Negative for vomiting.  Musculoskeletal:  Negative for back pain.  Skin:  Negative for rash.  Neurological:  Negative for loss of consciousness and headaches.    Vitals:   Vitals:   12/21/22 1047  BP: (!) 131/92  Pulse: 93  Resp: 17  Temp: 97.8 F (36.6 C)  TempSrc: Temporal  SpO2: 99%  Weight: 154 lb 6.4 oz (70 kg)  Height: 5' 1.5" (1.562 m)     Body mass index is 28.7 kg/m.  Physical Exam:   Physical Exam Vitals and nursing note reviewed.  Constitutional:      General: She is not  in acute distress.    Appearance: She is well-developed. She is not ill-appearing or toxic-appearing.  HENT:     Head: Normocephalic and atraumatic.     Right Ear: Tympanic membrane, ear canal and external ear normal. Tympanic membrane is not erythematous, retracted or bulging.     Left Ear: Tympanic membrane, ear canal and external ear normal. Tympanic membrane is not erythematous, retracted or bulging.     Nose: Nose normal.     Right Sinus: No maxillary sinus tenderness or frontal sinus tenderness.     Left Sinus: No maxillary sinus tenderness or frontal sinus tenderness.     Mouth/Throat:     Pharynx: Uvula midline. No posterior oropharyngeal erythema.  Eyes:     General: Lids are normal.     Conjunctiva/sclera: Conjunctivae normal.  Neck:     Trachea: Trachea normal.  Cardiovascular:     Rate and Rhythm: Normal rate and regular rhythm.     Pulses: Normal pulses.     Heart sounds: Normal heart sounds, S1 normal and S2 normal.  Pulmonary:     Effort: Pulmonary effort is normal.     Breath sounds: Normal breath sounds. No decreased breath sounds, wheezing, rhonchi or rales.  Lymphadenopathy:     Cervical: No cervical adenopathy.  Skin:    General: Skin is warm and dry.  Neurological:     Mental Status: She is alert.     GCS: GCS eye subscore is 4. GCS verbal subscore is 5. GCS motor subscore is 6.  Psychiatric:        Speech: Speech normal.        Behavior: Behavior normal. Behavior is cooperative.     Assessment and Plan:   Hearing loss of right ear, unspecified hearing loss type No red flags Hearing test normal Suspect ETD Recommend start oral antihistamine, singulair and flonase If sx persist, will refer to audiology  Seasonal allergic rhinitis due to pollen Uncontrolled Recommend starting daily oral antihistamine, singulair and flonase Prior PCP completed her FMLA, so we will continue this as is for her  Follow-up in summer for CPE w/ PAP -- sooner if  concerns   I,Alexander Ruley,acting as a scribe for [employer], PA.,have documented all relevant documentation on the behalf of [name], PA,as directed by  [name], PA while in the presence of [name], [name].   I, [name], [name], have reviewed all documentation for this visit. The documentation on 12/21/22 for the exam, diagnosis, procedures, and orders are all accurate and complete.    [name], PA-C

## 2022-12-21 ENCOUNTER — Encounter: Payer: Self-pay | Admitting: Physician Assistant

## 2022-12-21 ENCOUNTER — Ambulatory Visit: Payer: BC Managed Care – PPO | Admitting: Physician Assistant

## 2022-12-21 VITALS — BP 138/90 | HR 93 | Temp 97.8°F | Resp 17 | Ht 61.5 in | Wt 154.4 lb

## 2022-12-21 DIAGNOSIS — H9191 Unspecified hearing loss, right ear: Secondary | ICD-10-CM | POA: Diagnosis not present

## 2022-12-21 DIAGNOSIS — Z87898 Personal history of other specified conditions: Secondary | ICD-10-CM | POA: Insufficient documentation

## 2022-12-21 DIAGNOSIS — J301 Allergic rhinitis due to pollen: Secondary | ICD-10-CM | POA: Diagnosis not present

## 2022-12-21 MED ORDER — CETIRIZINE HCL 10 MG PO TABS: 10.0000 mg | ORAL_TABLET | Freq: Every day | ORAL | 1 refills | Status: DC

## 2022-12-21 MED ORDER — FLUTICASONE PROPIONATE 50 MCG/ACT NA SUSP: 2.0000 | Freq: Every day | NASAL | 2 refills | Status: DC

## 2022-12-21 MED ORDER — MONTELUKAST SODIUM 10 MG PO TABS: 10.0000 mg | ORAL_TABLET | Freq: Every day | ORAL | 1 refills | Status: DC

## 2022-12-21 NOTE — Patient Instructions (Addendum)
It was great to see you!  Follow-up with [place]  May use over the counter antihistamines such as Zyrtec (cetirizine), Claritin (loratadine), Allegra (fexofenadine), or Xyzal (levocetirizine) daily as needed. May take twice a day if needed as long as it does not cause drowsiness.  May try Singulair 10mg  daily at night.   Continue Flonase 1 spray twice a day.  Nasal saline spray (i.e., Simply Saline) or nasal saline lavage (i.e., NeilMed) is recommended as needed and prior to medicated nasal sprays.  Drop off paperwork when you have it  Let's plan to do a physical in summer or whenever convenient with you.  Take care,  [name] PA-C

## 2022-12-28 ENCOUNTER — Emergency Department (HOSPITAL_BASED_OUTPATIENT_CLINIC_OR_DEPARTMENT_OTHER)
Admission: EM | Admit: 2022-12-28 | Discharge: 2022-12-28 | Disposition: A | Discharge status: Home / Self Care | Payer: BC Managed Care – PPO | Attending: Emergency Medicine | Admitting: Emergency Medicine

## 2022-12-28 ENCOUNTER — Other Ambulatory Visit: Payer: Self-pay

## 2022-12-28 ENCOUNTER — Emergency Department (HOSPITAL_BASED_OUTPATIENT_CLINIC_OR_DEPARTMENT_OTHER): Payer: BC Managed Care – PPO | Admitting: Radiology

## 2022-12-28 DIAGNOSIS — J069 Acute upper respiratory infection, unspecified: Secondary | ICD-10-CM | POA: Diagnosis not present

## 2022-12-28 DIAGNOSIS — R0981 Nasal congestion: Secondary | ICD-10-CM | POA: Diagnosis present

## 2022-12-28 DIAGNOSIS — Z20822 Contact with and (suspected) exposure to covid-19: Secondary | ICD-10-CM | POA: Insufficient documentation

## 2022-12-28 DIAGNOSIS — R509 Fever, unspecified: Secondary | ICD-10-CM

## 2022-12-28 LAB — CBC WITH DIFFERENTIAL/PLATELET
Abs Immature Granulocytes: 0.05 10*3/uL (ref 0.00–0.07)
Basophils Absolute: 0 10*3/uL (ref 0.0–0.1)
Basophils Relative: 0 %
Eosinophils Absolute: 0.1 10*3/uL (ref 0.0–0.5)
Eosinophils Relative: 1 %
HCT: 39.7 % (ref 36.0–46.0)
Hemoglobin: 13.8 g/dL (ref 12.0–15.0)
Immature Granulocytes: 1 %
Lymphocytes Relative: 5 %
Lymphs Abs: 0.5 10*3/uL — ABNORMAL LOW (ref 0.7–4.0)
MCH: 29.2 pg (ref 26.0–34.0)
MCHC: 34.8 g/dL (ref 30.0–36.0)
MCV: 84.1 fL (ref 80.0–100.0)
Monocytes Absolute: 0.5 10*3/uL (ref 0.1–1.0)
Monocytes Relative: 5 %
Neutro Abs: 8.2 10*3/uL — ABNORMAL HIGH (ref 1.7–7.7)
Neutrophils Relative %: 88 %
Platelets: 199 10*3/uL (ref 150–400)
RBC: 4.72 MIL/uL (ref 3.87–5.11)
RDW: 12.6 % (ref 11.5–15.5)
WBC: 9.4 10*3/uL (ref 4.0–10.5)
nRBC: 0 % (ref 0.0–0.2)

## 2022-12-28 LAB — BASIC METABOLIC PANEL
Anion gap: 10 (ref 5–15)
BUN: 7 mg/dL (ref 6–20)
CO2: 21 mmol/L — ABNORMAL LOW (ref 22–32)
Calcium: 9.8 mg/dL (ref 8.9–10.3)
Chloride: 104 mmol/L (ref 98–111)
Creatinine, Ser: 0.85 mg/dL (ref 0.44–1.00)
GFR, Estimated: >60 mL/min (ref >60)
Glucose, Bld: 142 mg/dL — ABNORMAL HIGH (ref 70–99)
Potassium: 3.3 mmol/L — ABNORMAL LOW (ref 3.5–5.1)
Sodium: 135 mmol/L (ref 135–145)

## 2022-12-28 LAB — RESP PANEL BY RT-PCR (RSV, FLU A&B, COVID)  RVPGX2
Influenza A by PCR: NEGATIVE
Influenza B by PCR: NEGATIVE
Resp Syncytial Virus by PCR: NEGATIVE
SARS Coronavirus 2 by RT PCR: NEGATIVE

## 2022-12-28 LAB — SARS CORONAVIRUS 2 BY RT PCR: SARS Coronavirus 2 by RT PCR: NEGATIVE

## 2022-12-28 MED ORDER — AMOXICILLIN-POT CLAVULANATE 875-125 MG PO TABS: 1.0000 | ORAL_TABLET | Freq: Two times a day (BID) | ORAL | 0 refills | Status: DC

## 2022-12-28 MED ORDER — KETOROLAC TROMETHAMINE 15 MG/ML IJ SOLN
15.0000 mg | Freq: Once | INTRAMUSCULAR | Status: AC
Start: 2022-12-28 — End: 2022-12-28
  Administered 2022-12-28: 15 mg via INTRAVENOUS
  Filled 2022-12-28: qty 1

## 2022-12-28 MED ORDER — IBUPROFEN 400 MG PO TABS
600.0000 mg | ORAL_TABLET | Freq: Once | ORAL | Status: AC
  Administered 2022-12-28: 600 mg via ORAL
  Filled 2022-12-28: qty 1

## 2022-12-28 MED ORDER — FENTANYL CITRATE PF 50 MCG/ML IJ SOSY
50.0000 ug | PREFILLED_SYRINGE | Freq: Once | INTRAMUSCULAR | Status: AC
  Administered 2022-12-28: 50 ug via INTRAVENOUS
  Filled 2022-12-28: qty 1

## 2022-12-28 MED ORDER — ONDANSETRON HCL 4 MG/2ML IJ SOLN
4.0000 mg | Freq: Once | INTRAMUSCULAR | Status: AC
  Administered 2022-12-28: 4 mg via INTRAVENOUS
  Filled 2022-12-28: qty 2

## 2022-12-28 MED ORDER — SODIUM CHLORIDE 0.9 % IV BOLUS
1000.0000 mL | Freq: Once | INTRAVENOUS | Status: AC
  Administered 2022-12-28: 1000 mL via INTRAVENOUS

## 2022-12-28 MED ORDER — ACETAMINOPHEN 500 MG PO TABS
1000.0000 mg | ORAL_TABLET | Freq: Once | ORAL | Status: AC
  Administered 2022-12-28: 1000 mg via ORAL
  Filled 2022-12-28: qty 2

## 2022-12-28 NOTE — ED Triage Notes (Addendum)
POV from home, amb to triage, A&O x 4, GCS 15  Pt sts that last night runny nose started last night, followed by body aches, feeling flushed, and cough. On meds for sinusitis. Recent travel to [place] via airplane on Monday, unsure of sick contacts.

## 2022-12-28 NOTE — ED Notes (Signed)
Patient transported to X-ray 

## 2022-12-28 NOTE — Discharge Instructions (Addendum)
Please read and follow all provided instructions.  Your diagnoses today include:  1. Upper respiratory tract infection, unspecified type   2. Febrile illness     Tests performed today include: Blood cell counts and electrolytes: no significant problems noted Flu/COVID/RSV testing: were negative CXR does not show pneumonia Vital signs. See below for your results today.   Medications prescribed:  Augmentin - antibiotic  You have been prescribed an antibiotic medicine: take the entire course of medicine even if you are feeling better. Stopping early can cause the antibiotic not to work.  Naproxen - anti-inflammatory pain medication Do not exceed 500mg  naproxen every 12 hours, take with food  You have been prescribed an anti-inflammatory medication or NSAID. Take with food. Take smallest effective dose for the shortest duration needed for your pain. Stop taking if you experience stomach pain or vomiting.   Take any prescribed medications only as directed.  Home care instructions:  Follow any educational materials contained in this packet. Please continue drinking plenty of fluids. Use over-the-counter cold and flu medications as needed as directed on packaging for symptom relief. You may also use ibuprofen or tylenol as directed on packaging for pain or fever.   BE VERY CAREFUL not to take multiple medicines containing Tylenol (also called acetaminophen). Doing so can lead to an overdose which can damage your liver and cause liver failure and possibly death.   Follow-up instructions: Please follow-up with your primary care provider in the next 2 days for further evaluation of your symptoms.   Return instructions:  Please return to the Emergency Department if you experience worsening symptoms. Please return if you have a high fever greater than 101 degrees not controlled with over-the-counter medications, persistent vomiting and cannot keep down fluids, or worsening trouble  breathing. Please return if you have any other emergent concerns.  Additional Information:  Your vital signs today were: BP 104/68   Pulse (!) 115   Temp (!) 101.8 F (38.8 C) (Oral)   Resp 20   Ht 5\' 2"  (1.575 m)   Wt 68 kg   SpO2 97%   BMI 27.44 kg/m  If your blood pressure (BP) was elevated above 135/85 this visit, please have this repeated by your doctor within one month.

## 2022-12-28 NOTE — ED Notes (Signed)
RN reviewed discharge instructions with pt. Pt verbalized understanding and had no further questions. VSS upon discharge.  

## 2022-12-28 NOTE — ED Provider Notes (Signed)
[facility] Provider Note   CSN: [id number] Arrival date & time: 12/28/22  2012     History  Chief Complaint  Patient presents with   Cough   Generalized Body Aches    [name] [name] is a 37 y.o. female.  Patient presents to the emergency department for evaluation of flulike symptoms over the past day.  Patient states that she returned home from [place].  She was on an airplane prior to her symptoms beginning.  She returned and was picked up at the airport and she noted there was a lot of pollen in the car.  She developed nasal congestion and sinus pressure yesterday evening.  She states that last night she took a shower and it felt like her body was burning and she has had a lot of bodyaches.  She reports subjective chills and fever.  She was unable to check her temperature at home.  No nausea, vomiting, or diarrhea.  She has been coughing.  Denies other distinct sick contacts.  Denies urinary symptoms or rash.  She has tried saline rinses at home, but this states that it made the top of her mouth feel like it was on fire.       Home Medications Prior to Admission medications   Medication Sig Start Date End Date Taking? Authorizing Provider  cetirizine (ZYRTEC) 10 MG tablet Take 1 tablet (10 mg total) by mouth daily. 12/21/22   [name], PA  diclofenac (CATAFLAM) 50 MG tablet Take 1 tablet (50 mg total) by mouth 3 (three) times daily as needed (for migraine headache). 11/02/22   Genia Harold, MD  fluticasone (FLONASE) 50 MCG/ACT nasal spray Place 2 sprays into both nostrils daily. 12/21/22   [name], PA  Fremanezumab-vfrm (AJOVY) 225 MG/1.5ML SOSY Inject 225 mg into the skin every 30 (thirty) days. 11/02/22   Genia Harold, MD  frovatriptan (FROVA) 2.5 MG tablet Take 1 tablet (2.5 mg total) by mouth as needed for migraine. If recurs, may repeat after 2 hours. Max of 2 tabs in 24 hours. 11/02/22   Genia Harold, MD   montelukast (SINGULAIR) 10 MG tablet Take 1 tablet (10 mg total) by mouth at bedtime. 12/21/22   [name], PA  topiramate (TOPAMAX) 50 MG tablet Take 3 tablets (150 mg total) by mouth at bedtime. 11/02/22   Genia Harold, MD      Allergies    Pollen extract    Review of Systems   Review of Systems  Physical Exam Updated Vital Signs BP 111/83   Pulse (!) 137   Temp 100 F (37.8 C) (Oral)   Resp 18   Ht 5\' 2"  (1.575 m)   Wt 68 kg   SpO2 97%   BMI 27.44 kg/m   Physical Exam Vitals and nursing note reviewed.  Constitutional:      Appearance: She is well-developed.  HENT:     Head: Normocephalic and atraumatic.     Jaw: No trismus.     Right Ear: Tympanic membrane, ear canal and external ear normal.     Left Ear: Tympanic membrane, ear canal and external ear normal.     Nose: Congestion present. No mucosal edema or rhinorrhea.     Right Sinus: Maxillary sinus tenderness and frontal sinus tenderness present.     Left Sinus: Maxillary sinus tenderness and frontal sinus tenderness present.     Mouth/Throat:     Mouth: Mucous membranes are moist. Mucous membranes are not dry.  No oral lesions.     Pharynx: Uvula midline. No oropharyngeal exudate, posterior oropharyngeal erythema or uvula swelling.     Tonsils: No tonsillar abscesses.  Eyes:     General:        Right eye: No discharge.        Left eye: No discharge.     Conjunctiva/sclera: Conjunctivae normal.  Cardiovascular:     Rate and Rhythm: Normal rate and regular rhythm.     Heart sounds: Normal heart sounds.  Pulmonary:     Effort: Pulmonary effort is normal. No respiratory distress.     Breath sounds: Normal breath sounds. No wheezing or rales.  Abdominal:     Palpations: Abdomen is soft.     Tenderness: There is no abdominal tenderness.  Musculoskeletal:     Cervical back: Normal range of motion and neck supple.  Lymphadenopathy:     Cervical: No cervical adenopathy.  Skin:    General: Skin is warm  and dry.  Neurological:     Mental Status: She is alert.  Psychiatric:        Mood and Affect: Mood normal.     ED Results / Procedures / Treatments   Labs (all labs ordered are listed, but only abnormal results are displayed) Labs Reviewed  CBC WITH DIFFERENTIAL/PLATELET - Abnormal; Notable for the following components:      Result Value   Neutro Abs 8.2 (*)    Lymphs Abs 0.5 (*)    All other components within normal limits  BASIC METABOLIC PANEL - Abnormal; Notable for the following components:   Potassium 3.3 (*)    CO2 21 (*)    Glucose, Bld 142 (*)    All other components within normal limits  SARS CORONAVIRUS 2 BY RT PCR  RESP PANEL BY RT-PCR (RSV, FLU A&B, COVID)  [id number]    EKG None  Radiology DG Chest 2 View  Result Date: 12/28/2022 CLINICAL DATA:  Cough and fevers EXAM: CHEST - 2 VIEW COMPARISON:  06/02/2020 FINDINGS: Cardiac shadow is within normal limits. No focal infiltrate or sizable effusion is seen. No bony abnormality is noted. IMPRESSION: No active cardiopulmonary disease. Electronically Signed   By: Inez Catalina M.D.   On: 12/28/2022 22:17    Procedures Procedures    Medications Ordered in ED Medications  acetaminophen (TYLENOL) tablet 1,000 mg (1,000 mg Oral Given 12/28/22 2041)  ketorolac (TORADOL) 15 MG/ML injection 15 mg (15 mg Intravenous Given 12/28/22 2045)  sodium chloride 0.9 % bolus 1,000 mL (0 mLs Intravenous Stopped 12/28/22 2152)  ibuprofen (ADVIL) tablet 600 mg (600 mg Oral Given 12/28/22 2154)  sodium chloride 0.9 % bolus 1,000 mL (1,000 mLs Intravenous New Bag/Given 12/28/22 2156)  fentaNYL (SUBLIMAZE) injection 50 mcg (50 mcg Intravenous Given 12/28/22 2302)  ondansetron (ZOFRAN) injection 4 mg (4 mg Intravenous Given 12/28/22 2302)    ED Course/ Medical Decision Making/ A&P    Patient seen and examined. History obtained directly from patient.   Labs/EKG: Ordered CBC, BMP, flu/COVID.  Imaging: None ordered  Medications/Fluids:  Ordered: IV Toradol, p.o. Tylenol, IV fluid bolus.   Most recent vital signs reviewed and are as follows: BP 111/83   Pulse (!) 137   Temp 100 F (37.8 C) (Oral)   Resp 18   Ht 5\' 2"  (1.575 m)   Wt 68 kg   SpO2 97%   BMI 27.44 kg/m   Initial impression: Flulike illness  9:53 PM Reassessment performed. Patient appears stable.  Heart rate  110-120, O2 sat 92-96%.  Labs personally reviewed and interpreted including: CBC unremarkable; BMP with mild hypokalemia, normal kidney function.  Flu and COVID-negative.  Imaging: Chest x-ray ordered.  Reviewed pertinent lab work and imaging with patient at bedside. Questions answered.   Most current vital signs reviewed and are as follows: BP 110/72   Pulse (!) 120   Temp (!) 101.8 F (38.8 C) (Oral)   Resp (!) 21   Ht 5\' 2"  (1.575 m)   Wt 68 kg   SpO2 96%   BMI 27.44 kg/m   Plan: Temperature higher now, p.o. ibuprofen ordered.  Will give additional fluid bolus and check chest x-ray.  10:56 PM Reassessment performed. Patient appears stable. Still c/o body aches. Temp improved. HR 110. Finishing 2nd liter of IVF.   Imaging personally visualized and interpreted including: CXR neg.   Reviewed pertinent lab work and imaging with patient at bedside. Questions answered.   Most current vital signs reviewed and are as follows: BP 104/68   Pulse (!) 115   Temp (!) 101.8 F (38.8 C) (Oral)   Resp 20   Ht 5\' 2"  (1.575 m)   Wt 68 kg   SpO2 97%   BMI 27.44 kg/m   Plan: She has a ride home, plan d/c. Will need rest, OTC meds, close follow-up. Will give dose of fentanyl   Prescriptions written for: naproxen, augmentin for possible sinusitis/fever  Other home care instructions discussed: Encouraged to rest and drink plenty of fluids.    ED return instructions discussed: Patient told to return to ED if their symptoms worsen, have high fever not controlled with tylenol, persistent vomiting, they feel they are dehydrated, or if they have  any other concerns.    Follow-up instructions discussed: Patient encouraged to follow-up with their PCP in 2 days.   11:14 PM HR 95-98.                            Medical Decision Making Amount and/or Complexity of Data Reviewed Labs: ordered.  Risk OTC drugs. Prescription drug management.   Patient with respiratory symptoms. Tachycardia on arrival, improving with fluids and antipyretics.  She did start with sinusitis type symptoms and now has flu-like illness.  No signs of dehydration, tolerating PO's. Lungs are clear, CXR without PNA. Normal WBC count. Doubt UTI without urinary symptoms. Doubt severe sepsis, no hypotension or other signs of end-organ damage.   The patient's vital signs, pertinent lab work and imaging were reviewed and interpreted as discussed in the ED course. Hospitalization was considered for further testing, treatments, or serial exams/observation. However as patient is well-appearing, has a stable exam, and reassuring studies today, I do not feel that they warrant admission at this time. This plan was discussed with the patient who verbalizes agreement and comfort with this plan and seems reliable and able to return to the Emergency Department with worsening or changing symptoms.           Final Clinical Impression(s) / ED Diagnoses Final diagnoses:  Upper respiratory tract infection, unspecified type  Febrile illness    Rx / DC Orders ED Discharge Orders          Ordered    amoxicillin-clavulanate (AUGMENTIN) 875-125 MG tablet  Every 12 hours        12/28/22 2302              Carlisle Cater, PA-C 12/28/22 2315    Octaviano Glow  J, MD 12/29/22 1059

## 2022-12-29 ENCOUNTER — Encounter: Payer: Self-pay | Admitting: Physician Assistant

## 2022-12-29 ENCOUNTER — Telehealth: Payer: Self-pay

## 2022-12-29 ENCOUNTER — Ambulatory Visit (INDEPENDENT_AMBULATORY_CARE_PROVIDER_SITE_OTHER): Payer: BC Managed Care – PPO | Admitting: Physician Assistant

## 2022-12-29 VITALS — BP 120/86 | HR 90 | Temp 98.0°F | Ht 62.0 in | Wt 156.0 lb

## 2022-12-29 DIAGNOSIS — B9689 Other specified bacterial agents as the cause of diseases classified elsewhere: Secondary | ICD-10-CM

## 2022-12-29 DIAGNOSIS — J019 Acute sinusitis, unspecified: Secondary | ICD-10-CM | POA: Diagnosis not present

## 2022-12-29 NOTE — Patient Instructions (Signed)
It was great to see you!  Start antibiotic as prescribed  Push fluids and rest  Message me if any concerns  Take care,  [name] PA-C

## 2022-12-29 NOTE — Transitions of Care (Post Inpatient/ED Visit) (Signed)
   12/29/2022  Name: [name] [name] MRN: [id number] DOB: June 02, 1986  Today's TOC FU Call Status: Today's TOC FU Call Status:: Unsuccessul Call (1st Attempt) Unsuccessful Call (1st Attempt) Date: 12/29/22  Attempted to reach the patient regarding the most recent Inpatient/ED visit.  Follow Up Plan: Additional outreach attempts will be made to reach the patient to complete the Transitions of Care (Post Inpatient/ED visit) call.   Signature Reymundo Poll, CMA

## 2022-12-29 NOTE — Progress Notes (Signed)
[name] [name] is a 37 y.o. female here for a new problem.  History of Present Illness:   Chief Complaint  Patient presents with   Sinus Problem    Pt needs work note    HPI  Sinus pressure: She complains of sinus pressure, cough, and nasal congestion beginning 2 days ago.  She also endorses painful burning sensation at the  roof of her mouth that burns.  At first she believed it was related to allergies to pollen.  She presented to the ED last night and was diagnosed with a febrile illness.  When discharged she was prescribed augmentin, has not started this yet. She tested negative for Covid and flu.  She reports that while in the ED she had a fever.  Chest Xray was normal.  Past Medical History:  Diagnosis Date   Allergy    Common migraine with intractable migraine 03/16/2018   Depression    Gonorrhea    Headache(784.0)    Infection    UTI   Shoulder dystocia, delivered, current hospitalization 06/14/2014   Vaginal Pap smear, abnormal    f/u ok     Social History   Tobacco Use   Smoking status: Former   Smokeless tobacco: Never  Scientific laboratory technician Use: Never used  Substance Use Topics   Alcohol use: Yes    Comment: occasionally   Drug use: Yes    Types: Marijuana    Comment: occasionally    Past Surgical History:  Procedure Laterality Date   CESAREAN SECTION N/A    [facility] 06/06/2020   CESAREAN SECTION WITH BILATERAL TUBAL LIGATION Bilateral 07/20/2016   Procedure: REPEAT CESAREAN SECTION WITH BILATERAL TUBAL LIGATION;  Surgeon: Crawford Givens, MD;  Location: [facility];  Service: Obstetrics;  Laterality: Bilateral;  Provider requests RNFA -ap    Family History  Problem Relation Age of Onset   Diabetes Mother    Hearing loss Neg Hx     Allergies  Allergen Reactions   Pollen Extract Itching    Current Medications:   Current Outpatient Medications:    cetirizine (ZYRTEC) 10 MG tablet, Take 1 tablet (10 mg total) by mouth daily., Disp:  90 tablet, Rfl: 1   diclofenac (CATAFLAM) 50 MG tablet, Take 1 tablet (50 mg total) by mouth 3 (three) times daily as needed (for migraine headache)., Disp: 30 tablet, Rfl: 11   fluticasone (FLONASE) 50 MCG/ACT nasal spray, Place 2 sprays into both nostrils daily., Disp: 16 g, Rfl: 2   Fremanezumab-vfrm (AJOVY) 225 MG/1.5ML SOSY, Inject 225 mg into the skin every 30 (thirty) days., Disp: 1.5 mL, Rfl: 11   frovatriptan (FROVA) 2.5 MG tablet, Take 1 tablet (2.5 mg total) by mouth as needed for migraine. If recurs, may repeat after 2 hours. Max of 2 tabs in 24 hours., Disp: 10 tablet, Rfl: 11   montelukast (SINGULAIR) 10 MG tablet, Take 1 tablet (10 mg total) by mouth at bedtime., Disp: 90 tablet, Rfl: 1   topiramate (TOPAMAX) 50 MG tablet, Take 3 tablets (150 mg total) by mouth at bedtime., Disp: 270 tablet, Rfl: 4   amoxicillin-clavulanate (AUGMENTIN) 875-125 MG tablet, Take 1 tablet by mouth every 12 (twelve) hours. (Patient not taking: Reported on 12/29/2022), Disp: 14 tablet, Rfl: 0   Review of Systems:   Review of Systems  Constitutional:  Positive for fever (at ED).  HENT:  Positive for congestion (nasal), sinus pain (pressure) and sore throat (burning at roof of mouth).   Respiratory:  Positive for cough.     Vitals:   Vitals:   12/29/22 1025  BP: 120/86  Pulse: 90  Temp: 98 F (36.7 C)  TempSrc: Temporal  SpO2: 97%  Weight: 156 lb (70.8 kg)  Height: 5\' 2"  (1.575 m)     Body mass index is 28.53 kg/m.  Physical Exam:   Physical Exam Vitals and nursing note reviewed.  Constitutional:      General: She is not in acute distress.    Appearance: She is well-developed. She is not ill-appearing or toxic-appearing.  HENT:     Head: Normocephalic and atraumatic.     Right Ear: Tympanic membrane, ear canal and external ear normal. Tympanic membrane is not erythematous, retracted or bulging.     Left Ear: Tympanic membrane, ear canal and external ear normal. Tympanic membrane is  not erythematous, retracted or bulging.     Nose: Nose normal.     Right Sinus: No maxillary sinus tenderness or frontal sinus tenderness.     Left Sinus: No maxillary sinus tenderness or frontal sinus tenderness.     Mouth/Throat:     Pharynx: Uvula midline. No posterior oropharyngeal erythema.  Eyes:     General: Lids are normal.     Conjunctiva/sclera: Conjunctivae normal.  Neck:     Trachea: Trachea normal.  Cardiovascular:     Rate and Rhythm: Normal rate and regular rhythm.     Heart sounds: Normal heart sounds, S1 normal and S2 normal.  Pulmonary:     Effort: Pulmonary effort is normal.     Breath sounds: Normal breath sounds. No decreased breath sounds, wheezing, rhonchi or rales.  Lymphadenopathy:     Cervical: No cervical adenopathy.  Skin:    General: Skin is warm and dry.  Neurological:     Mental Status: She is alert.  Psychiatric:        Speech: Speech normal.        Behavior: Behavior normal. Behavior is cooperative.     Assessment and Plan:   Acute bacterial sinusitis No red flags on exam.   Continue plan of care for oral augmentin Push fluids and rest Work note provided for Leave Of Absence starting today 3/28 and to tentatively RTW on 4/1 Follow-up if any worsening sx    [name], PA-C

## 2023-01-02 MED ORDER — FLUCONAZOLE 150 MG PO TABS: 150.0000 mg | ORAL_TABLET | Freq: Once | ORAL | 0 refills | Status: AC

## 2023-01-02 NOTE — Telephone Encounter (Signed)
Okay to send in Diflucan? 

## 2023-01-02 NOTE — Telephone Encounter (Signed)
Please see message about FMLA. I sent Rx for Diflucan.

## 2023-04-18 ENCOUNTER — Ambulatory Visit: Payer: BC Managed Care – PPO | Admitting: Physician Assistant

## 2023-04-18 ENCOUNTER — Encounter: Payer: Self-pay | Admitting: Physician Assistant

## 2023-04-18 VITALS — BP 120/80 | HR 76 | Temp 97.3°F | Ht 62.0 in | Wt 140.0 lb

## 2023-04-18 DIAGNOSIS — N946 Dysmenorrhea, unspecified: Secondary | ICD-10-CM

## 2023-04-18 DIAGNOSIS — R229 Localized swelling, mass and lump, unspecified: Secondary | ICD-10-CM

## 2023-04-18 NOTE — Progress Notes (Signed)
[name] [name] is a 37 y.o. female here for a new problem.  History of Present Illness:   Chief Complaint  Patient presents with   Mass    Pt c/o bump behind left ear since May, no pain.   Dysmenorrhea    Pt c/o painful periods for the years, worse in the past year.    HPI  Small mass behind left ear:  She complains of small mass behind her left ear since May 2024. Sh has no pain while touching it.  She has not noticed if it changed in size.  She denies prior injury to the area before developing it.   Menstrual cycles; Migraines with aura  She complains of painful menstrual cycles for the past couple of years but it has worsened this past year.  She is currently experiencing a painful menstrual cycle.  She finds her cycles worsened after giving birth to her first daughter 6 years ago.  She does not have a history of fibroids.  Her menstrual cycles typically last one week.  She switched from tampons to pads and found no change in her symptoms.   While wearing tampons she was switching them every 2 hours.  She notices discharge when having menstrual cramps.  She was on birth control medication when younger but stopped and has not resumed them since then.  She had frequent bleeding when taking birth control years ago.  Her menstrual cycles were heavy when she first started receiving them but improved over time.    She has a history of aura with her migraines and continues taking 150 mg topamax to manage them.    Past Medical History:  Diagnosis Date   Allergy    Common migraine with intractable migraine 03/16/2018   Depression    Gonorrhea    Headache(784.0)    Infection    UTI   Shoulder dystocia, delivered, current hospitalization 06/14/2014   Vaginal Pap smear, abnormal    f/u ok     Social History   Tobacco Use   Smoking status: Former   Smokeless tobacco: Never  Vaping Use   Vaping status: Never Used  Substance Use Topics   Alcohol use: Yes    Comment:  occasionally   Drug use: Yes    Types: Marijuana    Comment: occasionally    Past Surgical History:  Procedure Laterality Date   CESAREAN SECTION N/A    [facility] 06/06/2020   CESAREAN SECTION WITH BILATERAL TUBAL LIGATION Bilateral 07/20/2016   Procedure: REPEAT CESAREAN SECTION WITH BILATERAL TUBAL LIGATION;  Surgeon: Jaymes Graff, MD;  Location: [facility];  Service: Obstetrics;  Laterality: Bilateral;  Provider requests RNFA -ap    Family History  Problem Relation Age of Onset   Diabetes Mother    Hearing loss Neg Hx     Allergies  Allergen Reactions   Pollen Extract Itching    Current Medications:   Current Outpatient Medications:    cetirizine (ZYRTEC) 10 MG tablet, Take 1 tablet (10 mg total) by mouth daily., Disp: 90 tablet, Rfl: 1   diclofenac (CATAFLAM) 50 MG tablet, Take 1 tablet (50 mg total) by mouth 3 (three) times daily as needed (for migraine headache)., Disp: 30 tablet, Rfl: 11   fluticasone (FLONASE) 50 MCG/ACT nasal spray, Place 2 sprays into both nostrils daily., Disp: 16 g, Rfl: 2   Fremanezumab-vfrm (AJOVY) 225 MG/1.5ML SOSY, Inject 225 mg into the skin every 30 (thirty) days., Disp: 1.5 mL, Rfl: 11   frovatriptan (  FROVA) 2.5 MG tablet, Take 1 tablet (2.5 mg total) by mouth as needed for migraine. If recurs, may repeat after 2 hours. Max of 2 tabs in 24 hours., Disp: 10 tablet, Rfl: 11   montelukast (SINGULAIR) 10 MG tablet, Take 1 tablet (10 mg total) by mouth at bedtime., Disp: 90 tablet, Rfl: 1   topiramate (TOPAMAX) 50 MG tablet, Take 3 tablets (150 mg total) by mouth at bedtime., Disp: 270 tablet, Rfl: 4   Review of Systems:   Review of Systems  Gastrointestinal:        (+)increased pain from menstrual cycle  Skin:        (+)small non-painful mass behind left ear    Vitals:   Vitals:   04/18/23 1301  BP: 120/80  Pulse: 76  Temp: (!) 97.3 F (36.3 C)  TempSrc: Temporal  SpO2: 100%  Weight: 140 lb (63.5 kg)  Height: 5\' 2"  (1.575  m)     Body mass index is 25.61 kg/m.  Physical Exam:   Physical Exam Vitals and nursing note reviewed.  Constitutional:      General: She is not in acute distress.    Appearance: She is well-developed. She is not ill-appearing or toxic-appearing.  HENT:     Ears:     Comments: Palpable nodule to posterior aspect of left ear without ttp Cardiovascular:     Rate and Rhythm: Normal rate and regular rhythm.     Pulses: Normal pulses.     Heart sounds: Normal heart sounds, S1 normal and S2 normal.  Pulmonary:     Effort: Pulmonary effort is normal.     Breath sounds: Normal breath sounds.  Skin:    General: Skin is warm and dry.  Neurological:     Mental Status: She is alert.     GCS: GCS eye subscore is 4. GCS verbal subscore is 5. GCS motor subscore is 6.  Psychiatric:        Speech: Speech normal.        Behavior: Behavior normal. Behavior is cooperative.     Assessment and Plan:   Localized skin mass, lump, or swelling Unclear etiology -- possible cyst Referral to dermatology to evaluate and treat  Dysmenorrhea Unclear etiology Encouraged her to try to start tracking periods so she can start taking anti-inflammatories ahead of pain Not a candidate for combined oral contraceptives - consider IUD/gynecology referral Will place ultrasound for further evaluation Will also provide MLO[employer] paperwork to cover for missed work yesterday, today and ?tomorrow  Engineer, structural as a Neurosurgeon for [employer], PA.,have documented all relevant documentation on the behalf of Jarold Motto, PA,as directed by  Jarold Motto, PA while in the presence of Jarold Motto, [place].  I, Jarold Motto, [place], have reviewed all documentation for this visit. The documentation on 04/18/23 for the exam, diagnosis, procedures, and orders are all accurate and complete.  Jarold Motto, PA-C

## 2023-04-18 NOTE — Patient Instructions (Addendum)
It was great to see you!  Dermatology referral [place] Address: [address] [place], [place], [place] [address] Open ? Closes 6?PM Phone: [phone]  Ultrasound order placed  Take care,  Jarold Motto PA-C

## 2023-04-26 ENCOUNTER — Encounter: Payer: Self-pay | Admitting: Physician Assistant

## 2023-04-28 NOTE — Telephone Encounter (Signed)
Form Printed for PCP review

## 2023-05-02 ENCOUNTER — Ambulatory Visit
Admission: RE | Admit: 2023-05-02 | Discharge: 2023-05-02 | Disposition: A | Discharge status: Home / Self Care | Payer: BC Managed Care – PPO | Source: Ambulatory Visit | Attending: Physician Assistant | Admitting: Physician Assistant

## 2023-05-02 DIAGNOSIS — N946 Dysmenorrhea, unspecified: Secondary | ICD-10-CM

## 2023-05-03 ENCOUNTER — Encounter: Payer: Self-pay | Admitting: Physician Assistant

## 2023-05-04 ENCOUNTER — Other Ambulatory Visit: Payer: Self-pay | Admitting: Physician Assistant

## 2023-05-04 DIAGNOSIS — R9389 Abnormal findings on diagnostic imaging of other specified body structures: Secondary | ICD-10-CM

## 2023-05-04 DIAGNOSIS — N946 Dysmenorrhea, unspecified: Secondary | ICD-10-CM

## 2023-05-24 ENCOUNTER — Telehealth: Payer: Self-pay | Admitting: Psychiatry

## 2023-05-24 NOTE — Telephone Encounter (Signed)
Unable to reach pt over the phone, sent mychart msg informing pt of need to reschedule 11/06/23 appt - MD leaving practice

## 2023-06-06 ENCOUNTER — Encounter: Payer: Self-pay | Admitting: Physician Assistant

## 2023-06-06 ENCOUNTER — Telehealth (INDEPENDENT_AMBULATORY_CARE_PROVIDER_SITE_OTHER): Payer: BC Managed Care – PPO | Admitting: Physician Assistant

## 2023-06-06 DIAGNOSIS — R051 Acute cough: Secondary | ICD-10-CM

## 2023-06-06 MED ORDER — BENZONATATE 100 MG PO CAPS: 100.0000 mg | ORAL_CAPSULE | Freq: Three times a day (TID) | ORAL | 1 refills | Status: DC | PRN

## 2023-06-06 NOTE — Progress Notes (Signed)
Virtual Visit via Video Note   I, Jarold Motto, PA, connected with  Karolee Meske  ([id number], [birth year]) on 06/06/23 at  2:00 PM EDT by a video-enabled telemedicine application and verified that I am speaking with the correct person using two identifiers.  Location: Patient: Home Provider: [facility] [place] office   I discussed the limitations of evaluation and management by telemedicine and the availability of in person appointments. The patient expressed understanding and agreed to proceed.    History of Present Illness: [name] [name] is a 37 y.o. who identifies as a female who was assigned female at birth, and is being seen today for cough, sore throat, headaches and fatigue. Symptom(s) started Tuesday of last week. She lost her voice and had to miss work starting on Friday of last week (8/30). She did not do a COVID test - does not feel like these symptom(s) are consistent with COVID. History of similar symptom(s) in the past but unfortunately her job requires her to do a lot of speaking.  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   S/P cesarean section 07/20/2016   History of miscarriage 07/20/2013    Allergies:  Allergies  Allergen Reactions   Pollen Extract Itching   Medications:  Current Outpatient Medications:    benzonatate (TESSALON PERLES) 100 MG capsule, Take 1 capsule (100 mg total) by mouth 3 (three) times daily as needed for cough., Disp: 30 capsule, Rfl: 1   cetirizine (ZYRTEC) 10 MG tablet, Take 1 tablet (10 mg total) by mouth daily., Disp: 90 tablet, Rfl: 1   diclofenac (CATAFLAM) 50 MG  tablet, Take 1 tablet (50 mg total) by mouth 3 (three) times daily as needed (for migraine headache)., Disp: 30 tablet, Rfl: 11   fluticasone (FLONASE) 50 MCG/ACT nasal spray, Place 2 sprays into both nostrils daily., Disp: 16 g, Rfl: 2   Fremanezumab-vfrm (AJOVY) 225 MG/1.5ML SOSY, Inject 225 mg into the skin every 30 (thirty) days., Disp: 1.5 mL, Rfl: 11   frovatriptan (FROVA) 2.5 MG tablet, Take 1 tablet (2.5 mg total) by mouth as needed for migraine. If recurs, may repeat after 2 hours. Max of 2 tabs in 24 hours., Disp: 10 tablet, Rfl: 11   montelukast (SINGULAIR) 10 MG tablet, Take 1 tablet (10 mg total) by mouth at bedtime., Disp: 90 tablet, Rfl: 1   topiramate (TOPAMAX) 50 MG tablet, Take 3 tablets (150 mg total) by mouth at bedtime., Disp: 270 tablet, Rfl: 4  Observations/Objective: Patient is well-developed, well-nourished in no acute distress.  Resting comfortably  at home.  Head is normocephalic, atraumatic.  No labored breathing.  Speech is clear and coherent with logical content.  Patient is alert and oriented at baseline.   Assessment and Plan: 1. Acute cough No red flags on exam. Suspect viral upper respiratory infection (URI) Symptoms improving with time and she hopes to return to work tomorrow. Will initiate tessalon perles 100 mg three times daily as needed.  Discussed taking medications as prescribed. Reviewed return precautions including worsening fever, SOB, worsening cough or other concerns. Push fluids and rest. I recommend that patient follow-up if symptoms worsen or  persist despite treatment x 7-10 days, sooner if needed.   Follow Up Instructions: I discussed the assessment and treatment plan with the patient. The patient was provided an opportunity to ask questions and all were answered. The patient agreed with the plan and demonstrated an understanding of the instructions.  A copy of instructions were sent to the patient via MyChart unless otherwise noted below.    The patient was advised to call back or seek an in-person evaluation if the symptoms worsen or if the condition fails to improve as anticipated.  Jarold Motto, [place]

## 2023-06-07 NOTE — Telephone Encounter (Signed)
FMLA forms completed and put in [name] [name]'s folder to sign.

## 2023-06-07 NOTE — Telephone Encounter (Signed)
Okay to fill out FMLA?

## 2023-07-06 ENCOUNTER — Encounter: Payer: Self-pay | Admitting: Physician Assistant

## 2023-07-06 ENCOUNTER — Telehealth: Payer: BC Managed Care – PPO | Admitting: Physician Assistant

## 2023-07-06 VITALS — Ht 62.0 in | Wt 140.0 lb

## 2023-07-06 DIAGNOSIS — N946 Dysmenorrhea, unspecified: Secondary | ICD-10-CM | POA: Diagnosis not present

## 2023-07-06 NOTE — Progress Notes (Signed)
I acted as a Neurosurgeon for [employer], PA-C Corky Mull, LPN  Virtual Visit via Video Note   I, Jarold Motto, PA, connected with  [name] [name]  ([id number], 12-May-1986) on 07/06/23 at 11:20 AM EDT by a video-enabled telemedicine application and verified that I am speaking with the correct person using two identifiers.  Location: Patient: Home Provider: [facility] [place] [facility] office   I discussed the limitations of evaluation and management by telemedicine and the availability of in person appointments. The patient expressed understanding and agreed to proceed.    History of Present Illness: [name] [name] is a 37 y.o. who identifies as a female who was assigned female at birth, and is being seen today for dysmenorrhea. Pt would like paperwork filled out due to missing work 9/28 thru 10/1 due to painful period. She said her cycle started on Friday 9/27 very heavy and cramps. Takes Tylenol and Advil with little relief. She has had to miss work due to being unable to to work from debilitating pain.  She returned to work yesterday. 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5ML SOSY, Inject 225 mg into the skin every 30 (thirty) days., Disp: 1.5 mL, Rfl: 11   frovatriptan (FROVA) 2.5 MG tablet, Take 1 tablet (2.5 mg total) by mouth as needed for migraine. If recurs, may repeat after 2 hours. Max of 2 tabs in 24 hours., Disp: 10 tablet, Rfl: 11   montelukast (SINGULAIR) 10 MG tablet, Take 1 tablet (10 mg total) by mouth at bedtime., Disp: 90 tablet, Rfl: 1   topiramate (TOPAMAX) 50 MG tablet, Take 3 tablets (150 mg total) by mouth at bedtime., Disp: 270 tablet, Rfl: 4  Observations/Objective: Patient is well-developed, well-nourished in no acute distress.  Resting comfortably  at home.  Head is normocephalic, atraumatic.  No labored breathing.  Speech is clear and coherent with logical content.  Patient is alert and oriented at baseline.    Assessment and Plan: 1. Dysmenorrhea No red flags Symptom(s) resolved We are managing this until she sees gynecology in Nov -- continue diclofenac as prescribed She would like to defer treatment(s) to gynecology once established   Follow Up Instructions: I discussed the assessment and treatment plan with the patient. The patient was provided an opportunity to ask questions and all were answered. The patient agreed with the plan and demonstrated an understanding of the instructions.  A copy of instructions were sent to the patient via MyChart unless otherwise noted below.   The patient was advised to call back or seek  an in-person evaluation if the symptoms worsen or if the condition fails to improve as anticipated.  Jarold Motto, [place]

## 2023-07-07 NOTE — Telephone Encounter (Signed)
Printed patient paperwork and left on Donna's desk for completion

## 2023-07-10 NOTE — Telephone Encounter (Signed)
FMLA paperwork faxed to [employer] at [phone].

## 2023-07-18 NOTE — Telephone Encounter (Signed)
Spoke to pt told her the form was completed same as before when you were out for your period. I did not change anything except for the dates. Pt verbalized understanding and said she will contact them. Told her okay.

## 2023-07-19 NOTE — Telephone Encounter (Signed)
Spoke to pt told her I added referral to GYN and also her appt date with them on the paperwork. I am not sure what else they want.  Please make sure you keep your appt with GYN. Pt verbalized understanding. Told her I will re fax paperwork over now. Pt verbalized understanding. Paperwork faxed to [employer] at [phone].

## 2023-08-24 ENCOUNTER — Ambulatory Visit: Payer: BC Managed Care – PPO | Admitting: Obstetrics and Gynecology

## 2023-08-24 ENCOUNTER — Encounter: Payer: Self-pay | Admitting: Obstetrics and Gynecology

## 2023-08-24 VITALS — BP 120/84 | HR 83 | Ht 60.25 in | Wt 143.0 lb

## 2023-08-24 DIAGNOSIS — N859 Noninflammatory disorder of uterus, unspecified: Secondary | ICD-10-CM

## 2023-08-24 DIAGNOSIS — N946 Dysmenorrhea, unspecified: Secondary | ICD-10-CM

## 2023-08-24 DIAGNOSIS — N920 Excessive and frequent menstruation with regular cycle: Secondary | ICD-10-CM

## 2023-08-24 DIAGNOSIS — N8003 Adenomyosis of the uterus: Secondary | ICD-10-CM

## 2023-08-24 MED ORDER — NORETHINDRONE ACETATE 5 MG PO TABS: 5.0000 mg | ORAL_TABLET | Freq: Every day | ORAL | 1 refills | Status: DC

## 2023-08-24 NOTE — Progress Notes (Signed)
37 y.o. y.o. female here for consultation for heavy, painful periods. She reports she has to crawl up in a ball and does not want to move when she has the dysmenorrhea and she is in tears.  Tylenol and midol only takes the edge off the pain. She reports it has always been present but has been worse the last 6 months.  She doesn't even feel like taking care of her kids or working on her cycle because it is so bad. She has had a tubal ligation with her cesarean delivery She also states she cannot use tampons because the bleeding is so heavy and she is using 4-5 super pads a day and bleeds for up to 7 days a month. She cannot take ocp's due to her migraines.  She has tried depo and bled the entire time on it.  She has also tried the IUD with no success.  No LMP recorded.     Narrative & Impression  CLINICAL DATA:  Heavy periods   EXAM: TRANSABDOMINAL AND TRANSVAGINAL ULTRASOUND OF PELVIS   TECHNIQUE: Both transabdominal and transvaginal ultrasound examinations of the pelvis were performed. Transabdominal technique was performed for global imaging of the pelvis including uterus, ovaries, adnexal regions, and pelvic cul-de-sac. It was necessary to proceed with endovaginal exam following the transabdominal exam to visualize the endometrium.   COMPARISON:  CT abdomen pelvis 05/22/2022   FINDINGS: Uterus   Measurements: 8.7 x 4.8 x 6.3 cm = volume: 138 mL. No fibroids or other mass visualized.   Endometrium   Thickness: 12.4 mm.  No focal abnormality visualized.   Right ovary   Measurements: 3.1 x 2.0 x 1.6 cm = volume: 5.2 mL. Normal appearance/no adnexal mass.   Left ovary   Measurements: 3.9 x 3.3 x 2.9 cm = volume: 19.6 mL. Normal appearance/no adnexal mass.   Other findings   Trace fluid in the pelvis.   IMPRESSION: Endometrium measures 12.4 mm. If bleeding remains unresponsive to hormonal or medical therapy, sonohysterogram should be considered for focal  lesion work-up. (Ref: Radiological Reasoning: Algorithmic Workup of Abnormal Vaginal Bleeding with Endovaginal Sonography and Sonohysterography. AJR 2008; 119:J47-82)   No acute process.     Electronically Signed   By: Annia Belt M.D.   On: 05/02/2023 15:47     There is no height or weight on file to calculate BMI.     12/29/2022   10:24 AM 12/08/2021    3:37 PM 10/12/2021    8:20 AM  Depression screen PHQ 2/9  Decreased Interest 0 1 0  Down, Depressed, Hopeless 0 2 0  PHQ - 2 Score 0 3 0  Altered sleeping  1 0  Tired, decreased energy  2 0  Change in appetite  3 0  Feeling bad or failure about yourself   1 0  Trouble concentrating  2 0  Moving slowly or fidgety/restless  1 0  Suicidal thoughts  1 0  PHQ-9 Score  14 0  Difficult doing work/chores  Very difficult Not difficult at all    There were no vitals taken for this visit.  No results found for: "DIAGPAP", "HPVHIGH", "ADEQPAP"  GYN HISTORY: No results found for: "DIAGPAP", "HPVHIGH", "ADEQPAP"  OB History  Gravida Para Term Preterm AB Living  4 2 2   2 2   SAB IAB Ectopic Multiple Live Births  2     0 2    # Outcome Date GA Lbr Len/2nd Weight Sex Type Anes PTL Lv  4 Term 07/20/16 [redacted]w[redacted]d  6 lb 8.4 oz (2.96 kg) F CS-LTranv Spinal  LIV     Birth Comments: normal exam  3 Term 06/13/14 [redacted]w[redacted]d 33:56 / 06:03 8 lb 2.7 oz (3.705 kg) M Vag-Vacuum EPI  LIV     Birth Comments:  Laboratory Number: [id number]  3705 grams NBS Device Barcode: [id number] Laboratory Results CAH: Normal GALACTOSEMIA: GALT 10.7 U/gHb Normal Ref Range ( >= 2.2 ) U/g Hb Total Galactose 1.3 mg/dl Normal Ref Range ( < 44.0 ) mg/dL THYROID: Normal BIOTINIDASE: Normal HEMOGLOBIN: Normal, FA CYSTIC FIBROSIS: Normal AMINO ACID PROFILE: Normal ACYLCARNITINE PROFILE: Normal   2 SAB 07/2013             Birth Comments: System Generated. Please review and update pregnancy details.  1 SAB 03/2013            Past Medical History:  Diagnosis Date    Allergy    Common migraine with intractable migraine 03/16/2018   Depression    Gonorrhea    Headache(784.0)    Infection    UTI   Shoulder dystocia, delivered, current hospitalization 06/14/2014   Vaginal Pap smear, abnormal    f/u ok    Past Surgical History:  Procedure Laterality Date   CESAREAN SECTION N/A    [employer] 06/06/2020   CESAREAN SECTION WITH BILATERAL TUBAL LIGATION Bilateral 07/20/2016   Procedure: REPEAT CESAREAN SECTION WITH BILATERAL TUBAL LIGATION;  Surgeon: Jaymes Graff, MD;  Location: [facility];  Service: Obstetrics;  Laterality: Bilateral;  Provider requests RNFA -ap    Current Outpatient Medications on File Prior to Visit  Medication Sig Dispense Refill   cetirizine (ZYRTEC) 10 MG tablet Take 1 tablet (10 mg total) by mouth daily. 90 tablet 1   diclofenac (CATAFLAM) 50 MG tablet Take 1 tablet (50 mg total) by mouth 3 (three) times daily as needed (for migraine headache). 30 tablet 11   fluticasone (FLONASE) 50 MCG/ACT nasal spray Place 2 sprays into both nostrils daily. 16 g 2   Fremanezumab-vfrm (AJOVY) 225 MG/1.5ML SOSY Inject 225 mg into the skin every 30 (thirty) days. 1.5 mL 11   frovatriptan (FROVA) 2.5 MG tablet Take 1 tablet (2.5 mg total) by mouth as needed for migraine. If recurs, may repeat after 2 hours. Max of 2 tabs in 24 hours. 10 tablet 11   montelukast (SINGULAIR) 10 MG tablet Take 1 tablet (10 mg total) by mouth at bedtime. 90 tablet 1   topiramate (TOPAMAX) 50 MG tablet Take 3 tablets (150 mg total) by mouth at bedtime. 270 tablet 4   No current facility-administered medications on file prior to visit.    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  Tobacco Use   Smoking status: Former   Smokeless tobacco: Never   Vaping Use   Vaping status: Never Used  Substance and Sexual Activity   Alcohol use: Yes    Comment: occasionally   Drug use: Yes    Types: Marijuana    Comment: occasionally   Sexual activity: Yes    Birth control/protection: None  Other Topics Concern   Not on file  Social History Narrative   Not on file   Social Determinants of Corporate investment banker  Strain: Not on file  Food Insecurity: Not on file  Transportation Needs: Not on file  Physical Activity: Not on file  Stress: Not on file  Social Connections: Not on file  Intimate Partner Violence: Not on file    Family History  Problem Relation Age of Onset   Diabetes Mother    Hearing loss Neg Hx      Allergies  Allergen Reactions   Pollen Extract Itching      Patient's last menstrual period was No LMP recorded..             Review of Systems Alls systems reviewed and are negative.     OBGyn Exam   fibroids, menorrhagia, dysmenorrhea, h/o LTCS x1, migraines, failed hormonal management                             P:       All options discussed with patient.  The r/b/a/I of each were discussed with patient.    She would like to have the [facility].  Counseled extensively on the procedure including but not limited to what to expect and risks and benefits.  Discussed it is a permanent procedure and she cannot have kids.  She has had the tubal but will discuss with her husband but knows she cannot continue life with the pain and bleeding. Discussed reversal is expensive and not always effective and IVF is expensive and she may need multiple cycles at the age of 26.   Counseled on postop care and pelvic rest for 10 weeks after the surgery with restricted lifting for 6 weeks after.  Counseled on the benefits of the robotic procedure with faster return to daily activities, improved outcomes, and less risk for complications. To begin Aygestin until she makes a decision on treatment options.  To send surgery request,  in case she would like this route, since she feels like this is leaning towards. RTC for EMB and annual exam in 2 weeks.  30 minutes spent on reviewing records, imaging,  and one on one patient time and counseling patient and documentation Dr. Karma Greaser   No follow-ups on file.  Earley Favor

## 2023-08-25 NOTE — Patient Instructions (Signed)
 Endometrial Biopsy  An endometrial biopsy is a procedure where a tissue sample is removed from the lining of the uterus. This lining is called the endometrium. The tissue sample is then sent to a lab for testing. You may have this type of biopsy to check for: Cancer. Infection. Growths called polyps. Uterine bleeding that can't be explained. Tell a health care provider about: Any allergies you have. All medicines you're taking including vitamins, herbs, eye drops, creams, and over-the-counter medicines. Any problems you or family members have had with anesthesia. Any bleeding problems you have. Any surgeries you have had. Any medical problems you have. Whether you're pregnant or may be pregnant. What are the risks? Your health care provider will talk with you about risks. These may include: Bleeding. Infection. Allergic reactions to medicines. Damage to the wall of the uterus. This is rare. What happens before the procedure? Keep track of your period. You may need to have this biopsy when you're not having your period. Ask your provider about: Changing or stopping your regular medicines. These include any diabetes medicines or blood thinners you take. Taking medicines such as aspirin and ibuprofen. These medicines can thin your blood. Do not take them unless your provider tells you to. Taking over-the-counter medicines, vitamins, herbs, and supplements. Bring a pad with you. You may need to wear one after the biopsy. Plan to have a responsible adult take you home from the hospital or clinic. You won't be allowed to drive. What happens during the procedure? A tool will be put into your vagina to hold it open. This helps your provider see the cervix. The cervix is the lowest part of the uterus. Your cervix will be cleaned with a solution that kills germs. You will be given anesthesia. This keeps you from feeling pain. It will numb your cervix. A tool called forceps will be used to  hold your cervix steady. A thin tool called a uterine sound will be put through your cervix. It will be used to: Find the length of your uterus. Find where to take the sample from. A soft tube called a catheter will be put into your uterus. The catheter will remove a tissue sample. The tube and tools will be removed. The sample will be sent to a lab for testing. The procedure may vary among providers and hospitals. What happens after the procedure? Your blood pressure, heart rate, breathing rate, and blood oxygen level will be monitored until you leave the hospital or clinic. It's up to you to get the results of your procedure. Ask your provider, or the department that is doing the procedure, when your results will be ready. This information is not intended to replace advice given to you by your health care provider. Make sure you discuss any questions you have with your health care provider. Document Revised: 11/29/2022 Document Reviewed: 11/29/2022 Elsevier Patient Education  2024 [employer].

## 2023-08-28 ENCOUNTER — Encounter: Payer: Self-pay | Admitting: Obstetrics and Gynecology

## 2023-08-30 ENCOUNTER — Encounter: Payer: Self-pay | Admitting: *Deleted

## 2023-09-06 NOTE — Telephone Encounter (Signed)
Letter pended and routed to Dr. Karma Greaser to review.

## 2023-09-06 NOTE — Telephone Encounter (Signed)
Dr. Karma Greaser -please review. I do not see anything documented in her last visit indicating she will be out of work.

## 2023-10-12 ENCOUNTER — Ambulatory Visit: Payer: BC Managed Care – PPO | Admitting: Obstetrics and Gynecology

## 2023-10-12 ENCOUNTER — Other Ambulatory Visit (HOSPITAL_COMMUNITY)
Admission: RE | Admit: 2023-10-12 | Discharge: 2023-10-12 | Disposition: A | Discharge status: Home / Self Care | Payer: BC Managed Care – PPO | Source: Ambulatory Visit | Attending: Obstetrics and Gynecology | Admitting: Obstetrics and Gynecology

## 2023-10-12 ENCOUNTER — Encounter: Payer: Self-pay | Admitting: Obstetrics and Gynecology

## 2023-10-12 VITALS — BP 114/70 | HR 81

## 2023-10-12 DIAGNOSIS — N920 Excessive and frequent menstruation with regular cycle: Secondary | ICD-10-CM | POA: Diagnosis present

## 2023-10-12 DIAGNOSIS — N8003 Adenomyosis of the uterus: Secondary | ICD-10-CM | POA: Diagnosis present

## 2023-10-12 DIAGNOSIS — N946 Dysmenorrhea, unspecified: Secondary | ICD-10-CM | POA: Insufficient documentation

## 2023-10-12 DIAGNOSIS — N859 Noninflammatory disorder of uterus, unspecified: Secondary | ICD-10-CM | POA: Diagnosis present

## 2023-10-12 NOTE — Progress Notes (Signed)
 38 y.o. y.o. female here for EMB. No LMP recorded. (Menstrual status: Irregular Periods).    female here for consultation for heavy, painful periods. She reports she has to crawl up in a ball and does not want to move when she has the dysmenorrhea and she is in tears.  Tylenol  and midol  only takes the edge off the pain. She reports it has always been present but has been worse the last 6 months.  She doesn't even feel like taking care of her kids or working on her cycle because it is so bad. She has had a tubal ligation with her cesarean delivery She also states she cannot use tampons because the bleeding is so heavy and she is using 4-5 super pads a day and bleeds for up to 7 days a month. She cannot take ocp's due to her migraines.  She has tried depo and bled the entire time on it.  She has also tried the IUD with no success. She has decided on having the [facility]. Her endometrial lining was thickened on US  and needs preop EMB as well. There is no height or weight on file to calculate BMI.     12/29/2022   10:24 AM 12/08/2021    3:37 PM 10/12/2021    8:20 AM  Depression screen PHQ 2/9  Decreased Interest 0 1 0  Down, Depressed, Hopeless 0 2 0  PHQ - 2 Score 0 3 0  Altered sleeping  1 0  Tired, decreased energy  2 0  Change in appetite  3 0  Feeling bad or failure about yourself   1 0  Trouble concentrating  2 0  Moving slowly or fidgety/restless  1 0  Suicidal thoughts  1 0  PHQ-9 Score  14 0  Difficult doing work/chores  Very difficult Not difficult at all    Blood pressure 114/70, pulse 81, SpO2 98%.  No results found for: DIAGPAP, HPVHIGH, ADEQPAP  GYN HISTORY: No results found for: DIAGPAP, HPVHIGH, ADEQPAP  OB History  Gravida Para Term Preterm AB Living  4 2 2  2 2   SAB IAB Ectopic Multiple Live Births  2   0 2    # Outcome Date GA Lbr Len/2nd Weight Sex Type Anes PTL Lv  4 Term 07/20/16 [redacted]w[redacted]d  6 lb 8.4 oz (2.96 kg) F CS-LTranv Spinal  LIV     Birth  Comments: normal exam  3 Term 06/13/14 [redacted]w[redacted]d 33:56 / 06:03 8 lb 2.7 oz (3.705 kg) M Vag-Vacuum EPI  LIV     Birth Comments:  Laboratory Number: [id number]  3705 grams NBS Device Barcode: [id number] Laboratory Results CAH: Normal GALACTOSEMIA: GALT 10.7 U/gHb Normal Ref Range ( >= 2.2 ) U/g Hb Total Galactose 1.3 mg/dl Normal Ref Range ( < 84.9 ) mg/dL THYROID: Normal BIOTINIDASE: Normal HEMOGLOBIN: Normal, FA CYSTIC FIBROSIS: Normal AMINO ACID PROFILE: Normal ACYLCARNITINE PROFILE: Normal   2 SAB 07/2013             Birth Comments: System Generated. Please review and update pregnancy details.  1 SAB 03/2013            Past Medical History:  Diagnosis Date   Abnormal Pap smear of cervix    Allergy    Anxiety    Common migraine with intractable migraine 03/16/2018   Depression    Gonorrhea    Headache(784.0)    Infection    UTI   Shoulder dystocia, delivered, current hospitalization 06/14/2014   Vaginal  Pap smear, abnormal    f/u ok    Past Surgical History:  Procedure Laterality Date   CESAREAN SECTION N/A    [employer] 06/06/2020   CESAREAN SECTION WITH BILATERAL TUBAL LIGATION Bilateral 07/20/2016   Procedure: REPEAT CESAREAN SECTION WITH BILATERAL TUBAL LIGATION;  Surgeon: Ovid All, MD;  Location: [facility];  Service: Obstetrics;  Laterality: Bilateral;  Provider requests RNFA -ap    Current Outpatient Medications on File Prior to Visit  Medication Sig Dispense Refill   cetirizine  (ZYRTEC ) 10 MG tablet Take 1 tablet (10 mg total) by mouth daily. 90 tablet 1   diclofenac  (CATAFLAM ) 50 MG tablet Take 1 tablet (50 mg total) by mouth 3 (three) times daily as needed (for migraine headache). 30 tablet 11   fluticasone  (FLONASE ) 50 MCG/ACT nasal spray Place 2 sprays into both nostrils daily. 16 g 2   Fremanezumab -vfrm (AJOVY ) 225 MG/1.5ML SOSY Inject 225 mg into the skin every 30 (thirty) days. 1.5 mL 11   frovatriptan  (FROVA ) 2.5 MG tablet Take 1 tablet  (2.5 mg total) by mouth as needed for migraine. If recurs, may repeat after 2 hours. Max of 2 tabs in 24 hours. 10 tablet 11   montelukast  (SINGULAIR ) 10 MG tablet Take 1 tablet (10 mg total) by mouth at bedtime. 90 tablet 1   norethindrone  (AYGESTIN ) 5 MG tablet Take 1 tablet (5 mg total) by mouth daily. 30 tablet 1   topiramate  (TOPAMAX ) 50 MG tablet Take 3 tablets (150 mg total) by mouth at bedtime. 270 tablet 4   No current facility-administered medications on file prior to visit.    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  Tobacco Use   Smoking status: Every Day    Types: E-cigarettes   Smokeless tobacco: Never   Tobacco comments:    vape  Vaping Use   Vaping status: Never Used  Substance and Sexual Activity   Alcohol use: Yes    Comment: occasionally   Drug use: Yes    Types: Marijuana    Comment: occasionally   Sexual activity: Yes    Partners: Male    Birth control/protection: Surgical    Comment: btl  Other Topics Concern   Not on file  Social History Narrative   Not on file   Social Drivers of Health   Financial Resource Strain: Not on file  Food Insecurity: Not on file  Transportation Needs: Not on file  Physical Activity: Not on file  Stress: Not on file  Social Connections: Not on file  Intimate Partner Violence: Not on file    Family History  Problem Relation Age of Onset   Diabetes Mother    Hearing loss Neg Hx      Allergies  Allergen Reactions   Pollen Extract Itching      Patient's last menstrual period was No LMP recorded. (Menstrual status: Irregular Periods)..           Review of Systems Alls systems reviewed and are negative.      PROCEDURE: EMB Consent obtained for the procedure.  A bivalve speculum was placed  in the vagina.  The cervix was grasped with a single tooth tenaculum.  Pipelle was inserted and rotated. Uterus sound to 8cm.  Adequate specimen was obtained and sent to pathology.  All instruments were removed.  Patient tolerated the procedure well.  To notify patient of the results.     A:        Menorrhagia, adenomyosis, h/o LTCSx2, dysmenorrhea, history of tubal ligation, thickened endometrial lining.                             P:        RTC for annual exam EMB collected today.  Counseled on the importance of the EMB.  Patient agreed  No follow-ups on file.  [name] [name]

## 2023-10-16 LAB — SURGICAL PATHOLOGY

## 2023-10-18 ENCOUNTER — Ambulatory Visit: Payer: BC Managed Care – PPO | Admitting: Obstetrics and Gynecology

## 2023-10-19 ENCOUNTER — Other Ambulatory Visit (HOSPITAL_COMMUNITY)
Admission: RE | Admit: 2023-10-19 | Discharge: 2023-10-19 | Disposition: A | Discharge status: Home / Self Care | Payer: BC Managed Care – PPO | Source: Ambulatory Visit | Attending: Obstetrics and Gynecology | Admitting: Obstetrics and Gynecology

## 2023-10-19 ENCOUNTER — Encounter: Payer: Self-pay | Admitting: Obstetrics and Gynecology

## 2023-10-19 ENCOUNTER — Ambulatory Visit: Payer: BC Managed Care – PPO | Admitting: Obstetrics and Gynecology

## 2023-10-19 VITALS — BP 118/68 | HR 68 | Resp 16 | Ht 60.25 in | Wt 143.0 lb

## 2023-10-19 DIAGNOSIS — Z01419 Encounter for gynecological examination (general) (routine) without abnormal findings: Secondary | ICD-10-CM | POA: Insufficient documentation

## 2023-10-19 DIAGNOSIS — Z1231 Encounter for screening mammogram for malignant neoplasm of breast: Secondary | ICD-10-CM

## 2023-10-19 NOTE — Progress Notes (Signed)
38 y.o. y.o. female here for annual exam  No LMP recorded. (Menstrual status: Irregular Periods).    female here for consultation for heavy, painful periods. She reports she has to crawl up in a ball and does not want to move when she has the dysmenorrhea and she is in tears.  Tylenol and midol only takes the edge off the pain. She reports it has always been present but has been worse the last 6 months.  She doesn't even feel like taking care of her kids or working on her cycle because it is so bad. She has had a tubal ligation with her cesarean delivery She also states she cannot use tampons because the bleeding is so heavy and she is using 4-5 super pads a day and bleeds for up to 7 days a month. She cannot take ocp's due to her migraines.  She has tried depo and bled the entire time on it.  She has also tried the IUD with no success. She has decided on having the [facility] but would like wait for this summer.  Progesterone is healing slightly for now. Her endometrial lining was thickened on [place]. EMB normal  Body mass index is 27.7 kg/m.     12/29/2022   10:24 AM 12/08/2021    3:37 PM 10/12/2021    8:20 AM  Depression screen PHQ 2/9  Decreased Interest 0 1 0  Down, Depressed, Hopeless 0 2 0  PHQ - 2 Score 0 3 0  Altered sleeping  1 0  Tired, decreased energy  2 0  Change in appetite  3 0  Feeling bad or failure about yourself   1 0  Trouble concentrating  2 0  Moving slowly or fidgety/restless  1 0  Suicidal thoughts  1 0  PHQ-9 Score  14 0  Difficult doing work/chores  Very difficult Not difficult at all    Blood pressure 118/68, pulse 68, resp. rate 16, height 5' 0.25" (1.53 m), weight 143 lb (64.9 kg).  No results found for: "DIAGPAP", "HPVHIGH", "ADEQPAP"  GYN HISTORY: No results found for: "DIAGPAP", "HPVHIGH", "ADEQPAP"  OB History  Gravida Para Term Preterm AB Living  4 2 2  2 2   SAB IAB Ectopic Multiple Live Births  2   0 2    # Outcome Date GA Lbr Len/2nd Weight  Sex Type Anes PTL Lv  4 Term 07/20/16 [redacted]w[redacted]d  6 lb 8.4 oz (2.96 kg) F CS-LTranv Spinal  LIV     Birth Comments: normal exam  3 Term 06/13/14 [redacted]w[redacted]d 33:56 / 06:03 8 lb 2.7 oz (3.705 kg) M Vag-Vacuum EPI  LIV     Birth Comments:  Laboratory Number: [id number]  3705 grams NBS Device Barcode: [id number] Laboratory Results CAH: Normal GALACTOSEMIA: GALT 10.7 U/gHb Normal Ref Range ( >= 2.2 ) U/g Hb Total Galactose 1.3 mg/dl Normal Ref Range ( < 62.1 ) mg/dL THYROID: Normal BIOTINIDASE: Normal HEMOGLOBIN: Normal, FA CYSTIC FIBROSIS: Normal AMINO ACID PROFILE: Normal ACYLCARNITINE PROFILE: Normal   2 SAB 07/2013             Birth Comments: System Generated. Please review and update pregnancy details.  1 SAB 03/2013            Past Medical History:  Diagnosis Date   Abnormal Pap smear of cervix    Allergy    Anxiety    Common migraine with intractable migraine 03/16/2018   Depression    Gonorrhea    Headache(784.0)  Infection    UTI   Shoulder dystocia, delivered, current hospitalization 06/14/2014   Vaginal Pap smear, abnormal    f/u ok    Past Surgical History:  Procedure Laterality Date   CESAREAN SECTION N/A    [employer] 06/06/2020   CESAREAN SECTION WITH BILATERAL TUBAL LIGATION Bilateral 07/20/2016   Procedure: REPEAT CESAREAN SECTION WITH BILATERAL TUBAL LIGATION;  Surgeon: Jaymes Graff, MD;  Location: [facility];  Service: Obstetrics;  Laterality: Bilateral;  Provider requests RNFA -ap    Current Outpatient Medications on File Prior to Visit  Medication Sig Dispense Refill   cetirizine (ZYRTEC) 10 MG tablet Take 1 tablet (10 mg total) by mouth daily. 90 tablet 1   diclofenac (CATAFLAM) 50 MG tablet Take 1 tablet (50 mg total) by mouth 3 (three) times daily as needed (for migraine headache). 30 tablet 11   fluticasone (FLONASE) 50 MCG/ACT nasal spray Place 2 sprays into both nostrils daily. 16 g 2   Fremanezumab-vfrm (AJOVY) 225 MG/1.5ML SOSY Inject 225 mg  into the skin every 30 (thirty) days. 1.5 mL 11   frovatriptan (FROVA) 2.5 MG tablet Take 1 tablet (2.5 mg total) by mouth as needed for migraine. If recurs, may repeat after 2 hours. Max of 2 tabs in 24 hours. 10 tablet 11   montelukast (SINGULAIR) 10 MG tablet Take 1 tablet (10 mg total) by mouth at bedtime. 90 tablet 1   norethindrone (AYGESTIN) 5 MG tablet Take 1 tablet (5 mg total) by mouth daily. 30 tablet 1   topiramate (TOPAMAX) 50 MG tablet Take 3 tablets (150 mg total) by mouth at bedtime. 270 tablet 4   No current facility-administered medications on file prior to visit.    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  Tobacco Use   Smoking status: Every Day    Types: E-cigarettes   Smokeless tobacco: Never   Tobacco comments:    vape  Vaping Use   Vaping status: Never Used  Substance and Sexual Activity   Alcohol use: Yes    Comment: occasionally   Drug use: Yes    Types: Marijuana    Comment: occasionally   Sexual activity: Yes    Partners: Male    Birth control/protection: Surgical    Comment: btl  Other Topics Concern   Not on file  Social History Narrative   Not on file   Social Drivers of Health   Financial Resource Strain: Not on file  Food Insecurity: Not on file  Transportation Needs: Not on file  Physical Activity: Not on file  Stress: Not on file  Social Connections: Not on file  Intimate Partner Violence: Not on file    Family History  Problem Relation Age of Onset   Diabetes Mother    Hearing loss Neg Hx      Allergies  Allergen Reactions   Pollen Extract Itching      Patient's last menstrual period was No LMP recorded. (Menstrual status: Irregular Periods)..           Review of Systems Alls systems reviewed and are  negative.      7 d ago   SURGICAL PATHOLOGY SURGICAL PATHOLOGY CASE: [id number] PATIENT: [name] [facility] Surgical Pathology Report  Clinical History: thickened lining, menorrhagia with regular cycle, dysmenorrhea, adenomyosis, disorder of endometrium, having RLH soon (cm)     FINAL MICROSCOPIC DIAGNOSIS:  A. ENDOMETRIUM, BIOPSY:      Benign proliferative endometrium.      Negative for hyperplasia, atypia or malignancy.   GROSS DESCRIPTION:  A. Received in formalin labeled with the patients name and DOB is a 1.7 x 1.5 x 0.2 cm aggregate of red-brown soft tissue fragments, submitted in toto in a single cassette(s).  (LEF 10/13/2023)  Final Diagnosis performed by Lance Coon, MD.   Electronically signed 10/16/2023 Technical and / or Professional components performed at [facility]. [facility], [address] [place], [place] [address].  Immunohistochemistry Technical component (if applicable) was performed at [facility]. [address], [address], [place], [place] [address].   IMMUNOHISTOCHEMISTRY DISCLAIMER (if applicable): Some of these immunohistochemical stains may have been developed and the performance characteristics determine by [facility]. Some may not have been cleared or approved by the U.S. Food and Drug Administration. The FDA has determined that such clearance or approval is not necessary. This test is used for clinical purposes. It should not be regarded as investigational or for research. This laboratory is certified under the Clinical Laboratory Improvement Amendments of 1988 (CLIA-88) as qualified to perform high complexity clinical laboratory testing.  The controls stained appropriately.   IHC stains are performed on formalin fixed, paraffin embedded tissue using a 3,3"diaminobenzidine (DAB) chromogen and Leica Bond Autostainer System. The staining intensity of the nucleus is score manually and is reported  as the percentage of tumor cell nuclei demonstrating specific nuclear staining. The specimens are fixed in 10% Neutral Formalin for at least 6 hours and up to 72hrs. These tests are validated on decalcified tissue. Results should be interpreted with caution given the possibility of false negative results on decalcified specimens. Antibody Clones are as follows ER-clone [id number], PR-clone 16, Ki67- clone MM1. Some of these immunohistochemical stains may have been developed and the performance characteristics determined by [facility].  Resulting Agency [facility]           A:        Menorrhagia, adenomyosis, h/o LTCSx2, dysmenorrhea, history of tubal ligation,likely adenomyosis, thickened endometrial lining, history of abnormal pap smears in the past here today for her annual                       Physical Exam Constitutional:      Appearance: Normal appearance.  Genitourinary:     Vulva and urethral meatus normal.     No lesions in the vagina.     Genitourinary Comments: Uterus with Boggy contour and tender to deep palpation     Right Labia: No rash, lesions or skin changes.    Left Labia: No lesions, skin changes or rash.    No vaginal discharge or tenderness.     No vaginal prolapse present.    No vaginal atrophy present.     Right Adnexa: not tender, not palpable and no mass present.    Left Adnexa: not tender, not palpable and no mass present.    No cervical motion tenderness or discharge.     Uterus is tender.     Uterus is not enlarged or irregular.  Breasts:    Right: Normal.     Left: Normal.  HENT:     Head: Normocephalic.  Neck:     Thyroid: No thyroid mass, thyromegaly  or thyroid tenderness.  Cardiovascular:     Rate and Rhythm: Normal rate and regular rhythm.     Heart sounds: Normal heart sounds, S1 normal and S2 normal.  Pulmonary:     Effort: Pulmonary effort is normal.     Breath sounds: Normal breath sounds and air entry.  Abdominal:      General: There is no distension.     Palpations: Abdomen is soft. There is no mass.     Tenderness: There is no abdominal tenderness. There is no guarding or rebound.  Musculoskeletal:        General: Normal range of motion.     Cervical back: Full passive range of motion without pain, normal range of motion and neck supple. No tenderness.     Right lower leg: No edema.     Left lower leg: No edema.  Neurological:     Mental Status: She is alert.  Skin:    General: Skin is warm.  Psychiatric:        Mood and Affect: Mood normal.        Behavior: Behavior normal.        Thought Content: Thought content normal.  Vitals and nursing note reviewed. Exam conducted with a chaperone present.       P:        Menorrhagia, adenomyosis, h/o LTCSx2, likely adenomyosis, dysmenorrhea, history of tubal ligation, thickened endometrial lining presenting for annual exam.  Would like to wait until this summer for her RLH. She would like to wait for her raise. Reports she is avoiding intercourse because she feels pain in her uterus.  The progesterone has helped somewhat. To stay on this for now. Pap smear collected Encouraged annual mammograms.  She will begin screening next month.  Referral placed Counseled on healthy lifestyle practices. No follow-ups on file.  Earley Favor

## 2023-10-23 ENCOUNTER — Encounter: Payer: Self-pay | Admitting: Obstetrics and Gynecology

## 2023-10-23 LAB — CYTOLOGY - PAP
Comment: NEGATIVE
Diagnosis: UNDETERMINED — AB
High risk HPV: NEGATIVE

## 2023-10-23 MED ORDER — METRONIDAZOLE 500 MG PO TABS: 500.0000 mg | ORAL_TABLET | Freq: Two times a day (BID) | ORAL | 0 refills | Status: AC

## 2023-11-04 ENCOUNTER — Other Ambulatory Visit: Payer: Self-pay | Admitting: Obstetrics and Gynecology

## 2023-11-06 ENCOUNTER — Telehealth: Payer: Self-pay | Admitting: Neurology

## 2023-11-06 ENCOUNTER — Ambulatory Visit: Payer: BC Managed Care – PPO | Admitting: Psychiatry

## 2023-11-06 NOTE — Telephone Encounter (Signed)
Med refill request: Aygestin Last AEX: 10/19/2023-EB Next AEX: recall in place for 2026 Last MMG (if hormonal med): none Refill authorized: rx pend.

## 2023-11-06 NOTE — Telephone Encounter (Signed)
Patient called stating she didn't know which Doctor she needs to follow up with now since Dr. Delena Bali is longer in the practice.

## 2023-12-20 ENCOUNTER — Ambulatory Visit (INDEPENDENT_AMBULATORY_CARE_PROVIDER_SITE_OTHER): Admitting: Neurology

## 2023-12-20 VITALS — BP 120/90 | HR 103 | Ht 61.0 in | Wt 150.0 lb

## 2023-12-20 DIAGNOSIS — R0683 Snoring: Secondary | ICD-10-CM

## 2023-12-20 DIAGNOSIS — G4719 Other hypersomnia: Secondary | ICD-10-CM | POA: Diagnosis not present

## 2023-12-20 DIAGNOSIS — E663 Overweight: Secondary | ICD-10-CM | POA: Diagnosis not present

## 2023-12-20 DIAGNOSIS — Z789 Other specified health status: Secondary | ICD-10-CM

## 2023-12-20 DIAGNOSIS — Z9189 Other specified personal risk factors, not elsewhere classified: Secondary | ICD-10-CM

## 2023-12-20 DIAGNOSIS — G43019 Migraine without aura, intractable, without status migrainosus: Secondary | ICD-10-CM | POA: Diagnosis not present

## 2023-12-20 MED ORDER — FROVATRIPTAN SUCCINATE 2.5 MG PO TABS: 2.5000 mg | ORAL_TABLET | ORAL | 5 refills | Status: DC | PRN

## 2023-12-20 MED ORDER — AJOVY 225 MG/1.5ML ~~LOC~~ SOSY: 225.0000 mg | PREFILLED_SYRINGE | SUBCUTANEOUS | 5 refills | Status: DC

## 2023-12-20 NOTE — Progress Notes (Signed)
 Subjective:    Patient ID: [name] [name] is a 38 y.o. female.  HPI    [name] Foley, MD, PhD [facility] [address], [address] [address] [place], [place] [address]   [name] is a 38 year old female with an underlying medical history of allergies, anxiety, depression, and recurrent headaches, who presents for follow-up consultation of her headaches.  The patient is unaccompanied today.  She presents for her first visit with me.  She previously saw by colleague Dr. Ocie [name], and was seen by her on 11/02/2022.  I reviewed the note and copied the note below for reference.  She was advised to start Topamax 150 mg at bedtime and Ajovy once daily injections.  For rescue therapy she was advised to start frovatriptan 2.5 mg as needed and use diclofenac as needed.  She also saw Dr. Stephanie [name] in this clinic before and [name] Ege, NP.  She was last seen by [name] Ege, NP on 11/04/2021. She was last seen by Dr. Anne [name] on 07/01/2021.  Today, 12/20/2023: She reports that she is no longer on Topamax.  She reports that she had some side effects from it.  Of note, she did not call our office in the interim with any side effect concerns, she stopped the Topamax in July 2024 on her own.  She did not see her PCP for any migraine flareup in the interim and did not call our office or email [place] through MyChart.  She has moved to [place] she states.  She would like to continue with her providers here however.  She ran out of frovatriptan approximately in December 2021.  Again, she did not contact us for a refill in the interim.  She has not been on Ajovy since October 2024.  She has had several interim migraines.  She also reports that she does not always hydrate well and sleep deprivation may be a trigger which she noticed recently.  She had a recent eye examination in January at [facility] in [place] and had an updated prescription for her glasses but the contact  lenses are the same.  She drinks quite a bit of caffeine in the form of tea, about 2 to 3 16 ounce servings per day.  She is trying to hydrate better with water, currently estimates that she drinks about 2-3 bottles of water per day, 16.9 ounce size each.  She endorses snoring.  She has never had a sleep study.  Epworth sleepiness score is 13 out of 24, fatigue severity score is 52 out of 63.  She had no side effects from frovatriptan or Ajovy.  She would be willing to restart these.  She would be willing to proceed with a home sleep test.  Previously:  11/02/2022 (Dr. Delena [name]): <<Last visit: 11/04/21   Brief HPI: 38 year old female who follows in clinic for migraines. At her last visit she was continued on Topamax and Ajovy for prevention, and frovatriptan for rescue.   Interval History: She stopped her medications in June due to financial issues. Has not been on any medications for the past 6 months. She has been taking Tylenol as needed which takes the edge off. She's currently averaging 2-3 migraines per month, which can last up to 2-3 days at a time.  She was previously taking Topamax, Ajovy, and frovatriptan and would like to restart her medications today.   Migraine days per month: 6 Headache free days per month: 24   Current Headache Regimen:  Preventative: none Abortive: none     Prior Therapies                                  Preventive: Nortriptyline - drowsiness Topamax 150 mg QHS Ajovy 225 mg monthly   Rescue: Frovatriptan 2.5 mg PRN Imitrex 25 mg PRN Diclofenac 50 mg PRN   >>   11/04/2021 [name] Ege, NP): <<[name] [name] is here today for follow-up with history of intractable migraine headache. On average reporting about 1 migraine a week.  Topamax was increased at last visit, she has done well with this.  She remains on Ajovy.  For acute headache, she will take Frova.  Usually starts to work within 30 minutes to 1 hour.  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   She may miss 1 to 2 days of work a month due to migraine.  She has 3 kids.  She is currently pleased with her migraine control.  She did not pick up the diclofenac potassium.  Has not been able to tolerate nortriptyline due to excessive drowsiness. >>  07/01/2021 (Dr. Anne [name]): <<[name] [name] is a 38 year old right-handed female with a history of intractable migraine headache.  She had done quite a bit better on Ajovy, she was also on nortriptyline and Topamax.  She stopped the nortriptyline at a 50 mg dose in August 2022.  Within a few weeks, she began developing fairly severe bifrontal headaches that are essentially daily at this point.  The patient indicates that the headaches are in the same location as her usual migraines but are more severe and associated with severe photophobia.  The patient does not have nausea or vomiting but she may have some queasiness.  The headaches are throbbing in nature.  She does have some sinus drainage at times, this is a recent issue.  She does have a history of allergies.  She did have a sinus infection back in May 2022.  She has not been able to go to work since 14 June 2021.>>  Her Past Medical History Is Significant For: Past Medical History:  Diagnosis Date   Abnormal Pap smear of cervix    Allergy    Anxiety    Common migraine with intractable migraine 03/16/2018   Depression    Gonorrhea    Headache(784.0)    Infection    UTI   Shoulder dystocia, delivered, current hospitalization 06/14/2014   Vaginal Pap smear, abnormal    f/u ok    Her Past Surgical History Is Significant For: Past Surgical History:  Procedure Laterality Date   CESAREAN SECTION N/A    [employer] 06/06/2020   CESAREAN SECTION WITH BILATERAL TUBAL LIGATION Bilateral 07/20/2016   Procedure: REPEAT CESAREAN SECTION WITH BILATERAL TUBAL LIGATION;  Surgeon: [name] Graff, MD;  Location: [facility];  Service: Obstetrics;  Laterality: Bilateral;  Provider requests RNFA -ap     Her Family History Is Significant For: Family History  Problem Relation Age of Onset   Diabetes Mother    Hearing loss Neg Hx     Her Social History Is Significant For: 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  Tobacco Use  Smoking status: Every Day    Types: E-cigarettes   Smokeless tobacco: Never   Tobacco comments:    vape  Vaping Use   Vaping status: Never Used  Substance and Sexual Activity   Alcohol use: Yes    Comment: occasionally   Drug use: Yes    Types: Marijuana    Comment: occasionally   Sexual activity: Yes    Partners: Male    Birth control/protection: Surgical    Comment: btl  Other Topics Concern   Not on file  Social History Narrative   Not on file   Social Drivers of Health   Financial Resource Strain: Not on file  Food Insecurity: Not on file  Transportation Needs: Not on file  Physical Activity: Not on file  Stress: Not on file  Social Connections: Not on file    Her Allergies Are:  Allergies  Allergen Reactions   Pollen Extract Itching  :   Her Current Medications Are:  Outpatient Encounter Medications as of 12/20/2023  Medication Sig   cetirizine (ZYRTEC) 10 MG tablet Take 1 tablet (10 mg total) by mouth daily.   diclofenac (CATAFLAM) 50 MG tablet Take 1 tablet (50 mg total) by mouth 3 (three) times daily as needed (for migraine headache).   fluticasone (FLONASE) 50 MCG/ACT nasal spray Place 2 sprays into both nostrils daily.   Fremanezumab-vfrm (AJOVY) 225 MG/1.5ML SOSY Inject 225 mg into the skin every 30 (thirty) days.   frovatriptan (FROVA) 2.5 MG tablet Take 1 tablet (2.5 mg total) by mouth as needed for migraine. If recurs, may repeat after 2 hours. Max of 2 tabs in 24 hours.   montelukast (SINGULAIR) 10 MG  tablet Take 1 tablet (10 mg total) by mouth at bedtime.   norethindrone (AYGESTIN) 5 MG tablet TAKE 1 TABLET (5 MG TOTAL) BY MOUTH DAILY. (Patient not taking: Reported on 12/20/2023)   topiramate (TOPAMAX) 50 MG tablet Take 3 tablets (150 mg total) by mouth at bedtime. (Patient not taking: Reported on 12/20/2023)   No facility-administered encounter medications on file as of 12/20/2023.  :   Review of Systems:  Out of a complete 14 point review of systems, all are reviewed and negative with the exception of these symptoms as listed below:   Review of Systems  Neurological:        Patient in room #5 and alone. Patient states she been having migraines for the pass five years. Patient states since march began she has had seven migraines. Patient would like to discuss her Rx for her migraines. ESS-13 , FSS-52    Objective:  Neurological Exam  Physical Exam Physical Examination:   Vitals:   12/20/23 1321  BP: (!) 120/90  Pulse: (!) 103    General Examination: The patient is a very pleasant 38 y.o. female in no acute distress. She appears well-developed and well-nourished and well groomed.   HEENT: Normocephalic, atraumatic, pupils are equal, round and reactive to light, extraocular tracking is good without limitation to gaze excursion or nystagmus noted.  Colored eye lenses in place.  Hearing is grossly intact. Face is symmetric with normal facial animation. Speech is clear with no dysarthria noted. There is no hypophonia. There is no lip, neck/head, jaw or voice tremor. Neck is supple with full range of passive and active motion. There are no carotid bruits on auscultation.   Chest: Clear to auscultation without wheezing, rhonchi or crackles noted.  Heart: S1+S2+0, regular and normal without murmurs, rubs or gallops noted.  Abdomen: Soft, non-tender and non-distended.  Extremities: There is no pitting edema in the distal lower extremities bilaterally.   Skin: Warm and dry without  trophic changes noted.   Musculoskeletal: exam reveals no obvious joint deformities.   Neurologically:  Mental status: The patient is awake, alert and oriented in all 4 spheres. Her immediate and remote memory, attention, language skills and fund of knowledge are appropriate. There is no evidence of aphasia, agnosia, apraxia or anomia. Speech is clear with normal prosody and enunciation. Thought process is linear. Mood is normal and affect is normal.  Cranial nerves II - XII are as described above under HEENT exam.  Motor exam: Normal bulk, strength and tone is noted. There is no obvious action or resting tremor.  No drift or rebound.  No postural or intention tremor. Fine motor skills and coordination: Intact finger taps, hand movements and rapid alternating patting with both upper extremities, intact foot taps bilaterally in the lower extremities bilaterally.    Cerebellar testing: No dysmetria or intention tremor. There is no truncal or gait ataxia.  Normal finger-to-nose, normal heel-to-shin bilaterally. Sensory exam: intact to light touch in the upper and lower extremities.  Romberg is negative. Reflexes are 2+ throughout, toes are downgoing bilaterally. Gait, station and balance: She stands easily. No veering to one side is noted. No leaning to one side is noted. Posture is age-appropriate and stance is narrow based. Gait shows normal stride length and normal pace. No problems turning are noted.  Tandem walk is normal.  Assessment and Plan:  In summary, [name] [name] is a very pleasant 38 y.o.-year old female 38 year old female with an underlying medical history of allergies, anxiety, depression, and recurrent headaches, who presents for follow-up consultation of her headaches, to establish with a new provider.  She has not been seen in this clinic in over 1 year.  She has stopped taking it, Ajovy and frovatriptan, did not get in touch with our office prior to running out or stopping these  medications.  She has noticed a flareup in her migraine headaches.  She was able to tolerate Ajovy injections and frovatriptan and we will restart these medications.  I had a long discussion with the patient regarding migraine triggers and alleviating factors and treatment options again today.    She is strongly advised to work on lifestyle modifications including better hydration and limiting caffeine as this can be a driver for her headaches and also sleep disturbances.  She may be at risk for obstructive sleep apnea, she is advised to proceed with a home sleep test at this time.  She asked about FMLA but I think we need to address this at the next visit as for now she has to reestablish care and reestablish medications and work on lifestyle modifications as well.  She was given detailed written instructions as well as verbal instructions today.  It may be more practical for her to establish with a primary care and neurologist provider locally in [place] in the future.  Below is a copy of my instructions to her today.  She is advised to follow-up in this clinic in about 6 to 8 months to see the nurse practitioner.  We may be able to offer her virtual visit at the time.  This was an extended visit of over 45 minutes with high complexity due to addressing multiple issues and considerable counseling and coordination of care involved as well as copious record review. << Please remember, common headache triggers are:  sleep deprivation, dehydration, overheating, stress, hypoglycemia or skipping meals and blood sugar fluctuations, excessive pain medications or excessive alcohol use or caffeine withdrawal. Some people have food triggers such as aged cheese, orange juice or chocolate, especially dark chocolate, or MSG (monosodium glutamate). Try to avoid these headache triggers as much possible. It may be helpful to keep a headache diary to figure out what makes your headaches worse or brings them on and what  alleviates them. Some people report headache onset after exercise but studies have shown that regular exercise may actually prevent headaches from coming. If you have exercise-induced headaches, please make sure that you drink plenty of fluid before and after exercising and that you do not over do it and do not overheat. Please reduce and limit your caffeine to 1-2 servings/day, 8 oz size each, as caffeine can drive headaches.  We will do a home sleep test to look for signs of obstructive sleep apnea (aka OSA). As explained, the long-term risks and ramifications of untreated moderate to severe obstructive sleep apnea may include (but are not limited to): increased risk for cardiovascular disease, including congestive heart failure, stroke, difficult to control hypertension, treatment resistant obesity, arrhythmias, especially irregular heartbeat commonly known as A. Fib. (atrial fibrillation); even type 2 diabetes has been linked to untreated OSA.  For headache prevention, I suggest we restart you on Ajovy 225 mg/1.5 ml, 1 injection subcutaneously every 30 days. Potential side effects include: Palpitations (as in fast heartbeat), hives, itching, rash, hoarseness, irritation at the injection site, joint pain and stiffness or joint swelling, swelling of the eyelids, face, lips, hands or feet, chest tightness, trouble breathing or swallowing.  Some side effects are mild and go away as your body gets used to the new medication.   If you have any serious side effects such as bleeding or blistering or discoloration of your skin, hives, significant itching or a rash, please seek immediate medical attention by going to the ER or calling 911.  Injection site reactions include itching, redness, pain at the injection site and Rare side effects include: Anaphylaxis (a severe allergic reaction), angioedema (severe swelling including around mouth and tongue). We will restart you on Frovatriptan as needed for acute  migraines. We will plan a follow up in about 6-8 months with Maralyn Sago.  Since you moved to [facility], you may want to consider establishing with a PCP and neurologist locally for your convenience.  We can address possible FMLA at the next visit.  >>  I answered all her questions today and she was in agreement.

## 2023-12-20 NOTE — Patient Instructions (Addendum)
 It was nice to meet you today.   As discussed, your headaches are likely due to a combination of factors.   Here is what we discussed today and my recommendations for you:   Please remember, common headache triggers are: sleep deprivation, dehydration, overheating, stress, hypoglycemia or skipping meals and blood sugar fluctuations, excessive pain medications or excessive alcohol use or caffeine withdrawal. Some people have food triggers such as aged cheese, orange juice or chocolate, especially dark chocolate, or MSG (monosodium glutamate). Try to avoid these headache triggers as much possible. It may be helpful to keep a headache diary to figure out what makes your headaches worse or brings them on and what alleviates them. Some people report headache onset after exercise but studies have shown that regular exercise may actually prevent headaches from coming. If you have exercise-induced headaches, please make sure that you drink plenty of fluid before and after exercising and that you do not over do it and do not overheat. Please reduce and limit your caffeine to 1-2 servings/day, 8 oz size each, as caffeine can drive headaches.  We will do a home sleep test to look for signs of obstructive sleep apnea (aka OSA). As explained, the long-term risks and ramifications of untreated moderate to severe obstructive sleep apnea may include (but are not limited to): increased risk for cardiovascular disease, including congestive heart failure, stroke, difficult to control hypertension, treatment resistant obesity, arrhythmias, especially irregular heartbeat commonly known as A. Fib. (atrial fibrillation); even type 2 diabetes has been linked to untreated OSA.  For headache prevention, I suggest we restart you on Ajovy 225 mg/1.5 ml, 1 injection subcutaneously every 30 days. Potential side effects include: Palpitations (as in fast heartbeat), hives, itching, rash, hoarseness, irritation at the injection site, joint  pain and stiffness or joint swelling, swelling of the eyelids, face, lips, hands or feet, chest tightness, trouble breathing or swallowing.  Some side effects are mild and go away as your body gets used to the new medication.   If you have any serious side effects such as bleeding or blistering or discoloration of your skin, hives, significant itching or a rash, please seek immediate medical attention by going to the ER or calling 911.  Injection site reactions include itching, redness, pain at the injection site and Rare side effects include: Anaphylaxis (a severe allergic reaction), angioedema (severe swelling including around mouth and tongue). We will restart you on Frovatriptan as needed for acute migraines. We will plan a follow up in about 6-8 months with Maralyn Sago.  Since you moved to [facility], you may want to consider establishing with a PCP and neurologist locally for your convenience.   We can address possible FMLA at the next visit.

## 2023-12-24 ENCOUNTER — Encounter: Payer: Self-pay | Admitting: Physician Assistant

## 2023-12-24 DIAGNOSIS — J301 Allergic rhinitis due to pollen: Secondary | ICD-10-CM

## 2023-12-25 MED ORDER — CETIRIZINE HCL 10 MG PO TABS: 10.0000 mg | ORAL_TABLET | Freq: Every day | ORAL | 0 refills | Status: DC

## 2023-12-25 MED ORDER — MONTELUKAST SODIUM 10 MG PO TABS: 10.0000 mg | ORAL_TABLET | Freq: Every day | ORAL | 0 refills | Status: DC

## 2024-01-30 ENCOUNTER — Ambulatory Visit: Admitting: Physician Assistant

## 2024-01-30 VITALS — BP 124/86 | HR 87 | Temp 97.9°F | Ht 61.0 in | Wt 147.5 lb

## 2024-01-30 DIAGNOSIS — K0889 Other specified disorders of teeth and supporting structures: Secondary | ICD-10-CM

## 2024-01-30 MED ORDER — KETOROLAC TROMETHAMINE 10 MG PO TABS: 10.0000 mg | ORAL_TABLET | Freq: Four times a day (QID) | ORAL | 0 refills | Status: DC | PRN

## 2024-01-30 MED ORDER — KETOROLAC TROMETHAMINE 60 MG/2ML IM SOLN
60.0000 mg | Freq: Once | INTRAMUSCULAR | Status: AC
  Administered 2024-01-30: 60 mg via INTRAMUSCULAR

## 2024-01-30 MED ORDER — ACETAMINOPHEN-CODEINE 300-30 MG PO TABS: 1.0000 | ORAL_TABLET | ORAL | 0 refills | Status: DC | PRN

## 2024-01-30 NOTE — Patient Instructions (Signed)
 It was great to see you!  Toradol  shot today May take oral oral Toradol  tomorrow -- take with food Do not take further diclofenac /ibuprofen /motrin  while taking Toradol   For severe pain, may take the narcotic that I sent in  Contact dentist as soon as possible to discuss extraction and let me know tentative return to work date  Take care,  Alexander Iba PA-C

## 2024-01-30 NOTE — Progress Notes (Signed)
 [name] [name] is a 38 y.o. female here for a new problem.  History of Present Illness:   Chief Complaint  Patient presents with   Dental Pain    Pt c/o right upper tooth pain, started on Wednesday.   Dental pain / Wisdom teeth Pt complains of upper and lower wisdom tooth pain on the left side, ongoing since Wednesday.  She reports pain when talking and eating, increased sensitivity when eating, and bleeding when brushing her teeth.  She endorses more swelling than in the past. Typically her wisdom teeth pain is triggered by a kernel getting stuck after eating popcorn, but she did not eat anything prior to onset of current pain.  She has previously experienced this pain in the last couple of years, but it tends to self-resolve.  She had her right upper and lower wisdom teeth removed several years ago.  She has taken Excedrin for the last 2 days to manage her pain.  Denies any fever, chills, or feeling of pus.   Talking triggers her pain and she had to miss work yesterday (4/28) and today (4/29)   Past Medical History:  Diagnosis Date   Abnormal Pap smear of cervix    Allergy    Anxiety    Common migraine with intractable migraine 03/16/2018   Depression    Gonorrhea    Headache(784.0)    Infection    UTI   Shoulder dystocia, delivered, current hospitalization 06/14/2014   Vaginal Pap smear, abnormal    f/u ok     Social History   Tobacco Use   Smoking status: Every Day    Types: E-cigarettes   Smokeless tobacco: Never   Tobacco comments:    vape  Vaping Use   Vaping status: Never Used  Substance Use Topics   Alcohol use: Yes    Comment: occasionally   Drug use: Yes    Types: Marijuana    Comment: occasionally    Past Surgical History:  Procedure Laterality Date   CESAREAN SECTION N/A    Phreesia 06/06/2020   CESAREAN SECTION WITH BILATERAL TUBAL LIGATION Bilateral 07/20/2016   Procedure: REPEAT CESAREAN SECTION WITH BILATERAL TUBAL LIGATION;  Surgeon:  Marylu Soda, MD;  [facility]: [facility];  Service: Obstetrics;  Laterality: Bilateral;  Provider requests RNFA -ap    Family History  Problem Relation Age of Onset   Diabetes Mother    Hearing loss Neg Hx     Allergies  Allergen Reactions   Pollen Extract Itching    Current Medications:   Current Outpatient Medications:    acetaminophen -codeine  (TYLENOL  #3) 300-30 MG tablet, Take 1 tablet by mouth every 4 (four) hours as needed for moderate pain (pain score 4-6) or severe pain (pain score 7-10)., Disp: 30 tablet, Rfl: 0   cetirizine  (ZYRTEC ) 10 MG tablet, Take 1 tablet (10 mg total) by mouth daily., Disp: 90 tablet, Rfl: 0   diclofenac  (CATAFLAM ) 50 MG tablet, Take 1 tablet (50 mg total) by mouth 3 (three) times daily as needed (for migraine headache)., Disp: 30 tablet, Rfl: 11   fluticasone  (FLONASE ) 50 MCG/ACT nasal spray, Place 2 sprays into both nostrils daily., Disp: 16 g, Rfl: 2   Fremanezumab -vfrm (AJOVY ) 225 MG/1.5ML SOSY, Inject 225 mg into the skin every 30 (thirty) days., Disp: 1.5 mL, Rfl: 5   frovatriptan  (FROVA ) 2.5 MG tablet, Take 1 tablet (2.5 mg total) by mouth as needed for migraine. If recurs, may repeat after 2 hours. Max of 2 tabs in  24 hours., Disp: 10 tablet, Rfl: 5   ketorolac  (TORADOL ) 10 MG tablet, Take 1 tablet (10 mg total) by mouth every 6 (six) hours as needed., Disp: 20 tablet, Rfl: 0   montelukast  (SINGULAIR ) 10 MG tablet, Take 1 tablet (10 mg total) by mouth at bedtime., Disp: 90 tablet, Rfl: 0   norethindrone  (AYGESTIN ) 5 MG tablet, TAKE 1 TABLET (5 MG TOTAL) BY MOUTH DAILY., Disp: 30 tablet, Rfl: 2   topiramate  (TOPAMAX ) 50 MG tablet, Take 3 tablets (150 mg total) by mouth at bedtime., Disp: 270 tablet, Rfl: 4   Review of Systems:   Negative unless otherwise specified per HPI.  Vitals:   Vitals:   01/30/24 1048  BP: 124/86  Pulse: 87  Temp: 97.9 F (36.6 C)  TempSrc: Temporal  SpO2: 95%  Weight: 147 lb 8 oz (66.9 kg)  Height: 5'  1" (1.549 m)     Body mass index is 27.87 kg/m.  Physical Exam:   Physical Exam Constitutional:      Appearance: Normal appearance. She is well-developed.  HENT:     Head: Normocephalic and atraumatic.     Mouth/Throat:     Comments: Left sided wisdom teeth with surrounding gum erythema and significant tenderness to palpation; no obvious discharge Eyes:     General: Lids are normal.     Extraocular Movements: Extraocular movements intact.     Conjunctiva/sclera: Conjunctivae normal.  Pulmonary:     Effort: Pulmonary effort is normal.  Musculoskeletal:        General: Normal range of motion.     Cervical back: Normal range of motion and neck supple.  Skin:    General: Skin is warm and dry.  Neurological:     Mental Status: She is alert and oriented to person, place, and time.  Psychiatric:        Attention and Perception: Attention and perception normal.        Mood and Affect: Mood normal.        Behavior: Behavior normal.        Thought Content: Thought content normal.        Judgment: Judgment normal.     Assessment and Plan:   1. Pain, dental (Primary) - ketorolac  (TORADOL ) injection 60 mg No red flags Advised as follows: -Toradol  shot today -May take oral oral Toradol  tomorrow -- take with food -Do not take further diclofenac /ibuprofen /motrin  while taking Toradol   For severe pain, may take the tylenol  #3 that I sent in -- prescription drug monitoring program reviewed without red flags  Contact dentist as soon as possible to discuss extraction and let me know tentative return to work date   I, Bernita Bristle, acting as a Neurosurgeon for [employer], [place]., have documented all relevant documentation on the behalf of Alexander Iba, [place], as directed by   while in the presence of Alexander Iba, [place].  I, Alexander Iba, [place], have reviewed all documentation for this visit. The documentation on 01/30/24 for the exam, diagnosis, procedures, and orders are all accurate  and complete.  Alexander Iba, PA-C

## 2024-02-01 ENCOUNTER — Other Ambulatory Visit: Payer: Self-pay | Admitting: Obstetrics and Gynecology

## 2024-02-01 ENCOUNTER — Ambulatory Visit: Payer: BC Managed Care – PPO | Admitting: Neurology

## 2024-02-01 NOTE — Telephone Encounter (Signed)
 Med refill request: aygestin  Last AEX: 10/19/23 Next AEX:not scheduled  Last MMG (if hormonal med) n/a Refill authorized: last rx 11/06/23 #30 with 2 refills. Please approve or deny

## 2024-02-07 ENCOUNTER — Encounter: Payer: Self-pay | Admitting: Physician Assistant

## 2024-02-07 NOTE — Telephone Encounter (Signed)
 FMLA completed and faxed to [employer] at [phone].

## 2024-02-14 ENCOUNTER — Ambulatory Visit: Admitting: Physician Assistant

## 2024-02-22 ENCOUNTER — Encounter: Payer: Self-pay | Admitting: Physician Assistant

## 2024-02-23 ENCOUNTER — Other Ambulatory Visit: Payer: Self-pay | Admitting: Obstetrics and Gynecology

## 2024-02-23 NOTE — Telephone Encounter (Signed)
 Med refill request: Aygestin  Last AEX: 10/19/23 Next AEX: not scheduled  Last MMG (if hormonal med) n/a Refill authorized: last rx 02/01/24 #30 with 2 refills. Pharmacy is requesting rx be sent in as 90 day. Please advise

## 2024-02-28 ENCOUNTER — Encounter: Payer: Self-pay | Admitting: Physician Assistant

## 2024-02-28 ENCOUNTER — Ambulatory Visit: Admitting: Physician Assistant

## 2024-02-28 VITALS — BP 138/86 | HR 108 | Temp 98.4°F | Ht 61.0 in | Wt 147.0 lb

## 2024-02-28 DIAGNOSIS — F411 Generalized anxiety disorder: Secondary | ICD-10-CM

## 2024-02-28 DIAGNOSIS — R7309 Other abnormal glucose: Secondary | ICD-10-CM

## 2024-02-28 DIAGNOSIS — F320 Major depressive disorder, single episode, mild: Secondary | ICD-10-CM | POA: Diagnosis not present

## 2024-02-28 MED ORDER — BUSPIRONE HCL 15 MG PO TABS: 15.0000 mg | ORAL_TABLET | Freq: Three times a day (TID) | ORAL | 1 refills | Status: AC | PRN

## 2024-02-28 NOTE — Progress Notes (Signed)
 [name] [name] is a 38 y.o. female here for a new problem.  History of Present Illness:   Chief Complaint  Patient presents with   Depression    Pt c/o feeling depressed since Tuesday after her friend left, realized how alone she was.     Depression Patient reports recent depressive episode after her friend left that was visiting her.  Patient has an overwhelming sense of feeling alone and not wanting to clean house or deal with her children She has been spending more time in her bed and feeling irritable She does have a history of postpartum depression after her daughter which she did seek counseling for She denies any current suicidal or homicidal ideation and reports that her kids are a barrier to any sort of plan She has had a few days away from work to be at home but this has not necessarily helped her  Anxiety She has feelings of being uptight and on edge on a regular basis Triggers include angry customers at work, children, having a spouse that works significant number of hours and being away from her family She is interested in as needed medication for her symptoms  She does have a history of elevated glucose We will check an A1c today Will also update her blood work to make sure there is no organic cause of her symptoms  Past Medical History:  Diagnosis Date   Abnormal Pap smear of cervix    Allergy    Anxiety    Common migraine with intractable migraine 03/16/2018   Depression    Gonorrhea    Headache(784.0)    Infection    UTI   Shoulder dystocia, delivered, current hospitalization 06/14/2014   Vaginal Pap smear, abnormal    f/u ok     Social History   Tobacco Use   Smoking status: Every Day    Types: E-cigarettes   Smokeless tobacco: Never   Tobacco comments:    vape  Vaping Use   Vaping status: Never Used  Substance Use Topics   Alcohol use: Yes    Comment: occasionally   Drug use: Yes    Types: Marijuana    Comment: occasionally    Past  Surgical History:  Procedure Laterality Date   CESAREAN SECTION N/A    Phreesia 06/06/2020   CESAREAN SECTION WITH BILATERAL TUBAL LIGATION Bilateral 07/20/2016   Procedure: REPEAT CESAREAN SECTION WITH BILATERAL TUBAL LIGATION;  Surgeon: Marylu Soda, MD;  Location: [facility];  Service: Obstetrics;  Laterality: Bilateral;  Provider requests RNFA -ap    Family History  Problem Relation Age of Onset   Diabetes Mother    Hearing loss Neg Hx     Allergies  Allergen Reactions   Pollen Extract Itching    Current Medications:   Current Outpatient Medications:    busPIRone  (BUSPAR ) 15 MG tablet, Take 1 tablet (15 mg total) by mouth 3 (three) times daily as needed., Disp: 30 tablet, Rfl: 1   cetirizine  (ZYRTEC ) 10 MG tablet, Take 1 tablet (10 mg total) by mouth daily., Disp: 90 tablet, Rfl: 0   diclofenac  (CATAFLAM ) 50 MG tablet, Take 1 tablet (50 mg total) by mouth 3 (three) times daily as needed (for migraine headache)., Disp: 30 tablet, Rfl: 11   fluticasone  (FLONASE ) 50 MCG/ACT nasal spray, Place 2 sprays into both nostrils daily., Disp: 16 g, Rfl: 2   Fremanezumab -vfrm (AJOVY ) 225 MG/1.5ML SOSY, Inject 225 mg into the skin every 30 (thirty) days., Disp: 1.5 mL, Rfl:  5   frovatriptan  (FROVA ) 2.5 MG tablet, Take 1 tablet (2.5 mg total) by mouth as needed for migraine. If recurs, may repeat after 2 hours. Max of 2 tabs in 24 hours., Disp: 10 tablet, Rfl: 5   montelukast  (SINGULAIR ) 10 MG tablet, Take 1 tablet (10 mg total) by mouth at bedtime., Disp: 90 tablet, Rfl: 0   norethindrone  (AYGESTIN ) 5 MG tablet, TAKE 1 TABLET (5 MG TOTAL) BY MOUTH DAILY., Disp: 90 tablet, Rfl: 0   topiramate  (TOPAMAX ) 50 MG tablet, Take 3 tablets (150 mg total) by mouth at bedtime., Disp: 270 tablet, Rfl: 4   Review of Systems:   Negative unless otherwise specified per HPI.  Vitals:   Vitals:   02/28/24 1430  BP: 138/86  Pulse: (!) 108  Temp: 98.4 F (36.9 C)  TempSrc: Temporal  SpO2: 95%   Weight: 147 lb (66.7 kg)  Height: 5\' 1"  (1.549 m)     Body mass index is 27.78 kg/m.  Physical Exam:   Physical Exam Vitals and nursing note reviewed.  Constitutional:      General: She is not in acute distress.    Appearance: She is well-developed. She is not ill-appearing or toxic-appearing.  Cardiovascular:     Rate and Rhythm: Normal rate and regular rhythm.     Pulses: Normal pulses.     Heart sounds: Normal heart sounds, S1 normal and S2 normal.  Pulmonary:     Effort: Pulmonary effort is normal.     Breath sounds: Normal breath sounds.  Skin:    General: Skin is warm and dry.  Neurological:     Mental Status: She is alert.     GCS: GCS eye subscore is 4. GCS verbal subscore is 5. GCS motor subscore is 6.  Psychiatric:        Speech: Speech normal.        Behavior: Behavior normal. Behavior is cooperative.     Assessment and Plan:   1. Depression, major, single episode, mild (HCC) (Primary) - Comprehensive metabolic panel with GFR - TSH - CBC with Differential/Platelet - Ambulatory referral to Psychology  No red flags I discussed with patient that if they develop any SI, to tell someone immediately and seek medical attention. Will place referral for talk therapy She is not interested in SSRI at this time Recommend follow-up soon for CPE, she is already scheduled we will discuss further her symptoms at that time Reviewed suicide precautions Update blood work and rule out organic cause of symptoms She does not take Singulair  consistently but I did discuss with her that this can cause disruptions in mood, I recommended that she hold it for 2 weeks to see if this is playing a role  2. GAD (generalized anxiety disorder) - Ambulatory referral to Psychology  Uncontrolled Will refer to talk therapy and start BuSpar 15 mg 3 times daily as needed for anxiety Recommend follow-up soon for CPE, she is already scheduled we will discuss further her symptoms at that  time  3. Elevated glucose - Hemoglobin A1c   Update blood sugars and A1c today No obvious hyperglycemic or hypoglycemic symptoms  Alexander Iba, PA-C

## 2024-02-28 NOTE — Patient Instructions (Signed)
 It was great to see you!  Stop singulair  for a week or so to see if it has been affecting your mood? You can restart if needed.  Start as needed buspar for your anxiety  Stop zyrtec  and start over the counter antihistamines Xyzal (levocetirizine) daily as needed for allergies   Referral to counselor  Let's follow-up when due for Comprehensive Physical Exam (CPE) preventive care annual visit to check in, sooner if concerns  Take care,  Alexander Iba PA-C

## 2024-02-28 NOTE — Telephone Encounter (Signed)
 LVM for pt to call back to schedule home sleep study.

## 2024-02-29 LAB — COMPREHENSIVE METABOLIC PANEL WITH GFR
ALT: 15 U/L (ref 0–35)
AST: 18 U/L (ref 0–37)
Albumin: 4.8 g/dL (ref 3.5–5.2)
Alkaline Phosphatase: 39 U/L (ref 39–117)
BUN: 11 mg/dL (ref 6–23)
CO2: 22 meq/L (ref 19–32)
Calcium: 9.8 mg/dL (ref 8.4–10.5)
Chloride: 110 meq/L (ref 96–112)
Creatinine, Ser: 0.98 mg/dL (ref 0.40–1.20)
GFR: 72.3 mL/min (ref >60.00)
Glucose, Bld: 155 mg/dL — ABNORMAL HIGH (ref 70–99)
Potassium: 3.6 meq/L (ref 3.5–5.1)
Sodium: 140 meq/L (ref 135–145)
Total Bilirubin: 0.6 mg/dL (ref 0.2–1.2)
Total Protein: 8 g/dL (ref 6.0–8.3)

## 2024-02-29 LAB — CBC WITH DIFFERENTIAL/PLATELET
Basophils Absolute: 0.1 10*3/uL (ref 0.0–0.1)
Basophils Relative: 1.4 % (ref 0.0–3.0)
Eosinophils Absolute: 0.1 10*3/uL (ref 0.0–0.7)
Eosinophils Relative: 1.3 % (ref 0.0–5.0)
HCT: 41.4 % (ref 36.0–46.0)
Hemoglobin: 14 g/dL (ref 12.0–15.0)
Lymphocytes Relative: 31.7 % (ref 12.0–46.0)
Lymphs Abs: 1.8 10*3/uL (ref 0.7–4.0)
MCHC: 33.7 g/dL (ref 30.0–36.0)
MCV: 88.2 fl (ref 78.0–100.0)
Monocytes Absolute: 0.3 10*3/uL (ref 0.1–1.0)
Monocytes Relative: 5.3 % (ref 3.0–12.0)
Neutro Abs: 3.4 10*3/uL (ref 1.4–7.7)
Neutrophils Relative %: 60.3 % (ref 43.0–77.0)
Platelets: 232 10*3/uL (ref 150.0–400.0)
RBC: 4.69 Mil/uL (ref 3.87–5.11)
RDW: 13.1 % (ref 11.5–15.5)
WBC: 5.7 10*3/uL (ref 4.0–10.5)

## 2024-02-29 LAB — HEMOGLOBIN A1C: Hgb A1c MFr Bld: 5.1 % (ref 4.6–6.5)

## 2024-02-29 LAB — TSH: TSH: 1.63 u[IU]/mL (ref 0.35–5.50)

## 2024-03-01 ENCOUNTER — Ambulatory Visit: Payer: Self-pay | Admitting: Physician Assistant

## 2024-03-08 ENCOUNTER — Encounter: Payer: Self-pay | Admitting: Physician Assistant

## 2024-03-11 MED ORDER — LEVOCETIRIZINE DIHYDROCHLORIDE 5 MG PO TABS: 5.0000 mg | ORAL_TABLET | Freq: Every evening | ORAL | 3 refills | Status: AC

## 2024-03-14 ENCOUNTER — Ambulatory Visit (INDEPENDENT_AMBULATORY_CARE_PROVIDER_SITE_OTHER): Admitting: Physician Assistant

## 2024-03-14 VITALS — BP 130/66 | Temp 97.9°F | Ht 61.0 in | Wt 147.8 lb

## 2024-03-14 DIAGNOSIS — F411 Generalized anxiety disorder: Secondary | ICD-10-CM | POA: Diagnosis not present

## 2024-03-14 DIAGNOSIS — Z0000 Encounter for general adult medical examination without abnormal findings: Secondary | ICD-10-CM | POA: Diagnosis not present

## 2024-03-14 DIAGNOSIS — N946 Dysmenorrhea, unspecified: Secondary | ICD-10-CM | POA: Diagnosis not present

## 2024-03-14 DIAGNOSIS — Z720 Tobacco use: Secondary | ICD-10-CM

## 2024-03-14 DIAGNOSIS — F320 Major depressive disorder, single episode, mild: Secondary | ICD-10-CM

## 2024-03-14 DIAGNOSIS — E663 Overweight: Secondary | ICD-10-CM

## 2024-03-14 NOTE — Progress Notes (Signed)
 Subjective:    [name] [name] is a 38 y.o. female and is here for a comprehensive physical exam.  HPI  Health Maintenance Due  Topic Date Due   HPV VACCINES (1 - 3-dose series) Never done    Acute Concerns: No acute concerns reported today.  Chronic Issues: Depression / Anxiety Pt has been on Buspar  15 mg TID as needed for 2 weeks so far. She reports she has only taken it 3 times. Pt states she has filled out the paperwork to start therapy, but still needs to schedule her first appt.  Dysmenorrhea Pt states she is deciding to get a hysterectomy either this year or next year.  She reports some second-guessing this surgery due to piqued interest in more children.  Migraines Pt reports headaches are better and well-managed by neurology No acute concerns reported today.  Health Maintenance: Immunizations -- See above. Colonoscopy -- N/a Mammogram -- N/a PAP -- Last done 10/19/2023, results show atypical squamous cells of undetermined significance. 5 year repeat recommended, next due 10/18/2028. Bone Density -- N/a Diet -- Overall healthy diet; cut out sugars and pasta. Intermittent fasting and intentional, steady weight loss of 1 lb/week. Exercise -- Takes walks on trail in her neighborhood before her children comes home.  Sleep habits -- See HPI. Mood -- Stable.  UTD with dentist? - UTD UTD with eye doctor? - UTD  Weight history: Wt Readings from Last 10 Encounters:  03/14/24 147 lb 12.8 oz (67 kg)  02/28/24 147 lb (66.7 kg)  01/30/24 147 lb 8 oz (66.9 kg)  12/20/23 150 lb (68 kg)  10/19/23 143 lb (64.9 kg)  08/24/23 143 lb (64.9 kg)  07/06/23 140 lb (63.5 kg)  04/18/23 140 lb (63.5 kg)  12/29/22 156 lb (70.8 kg)  12/28/22 150 lb (68 kg)   Body mass index is 27.93 kg/m. Patient's last menstrual period was 10/03/2023 (approximate).  Alcohol use:  reports current alcohol use.  Tobacco use:  Tobacco Use: High Risk (03/14/2024)   Patient History    Smoking  Tobacco Use: Every Day    Smokeless Tobacco Use: Never    Passive Exposure: Not on file   Eligible for lung cancer screening?  No     02/28/2024    3:12 PM  Depression screen PHQ 2/9  Decreased Interest 2  Down, Depressed, Hopeless 3  PHQ - 2 Score 5  Altered sleeping 3  Tired, decreased energy 3  Change in appetite 3  Feeling bad or failure about yourself  3  Trouble concentrating 3  Moving slowly or fidgety/restless 3  Suicidal thoughts 0  PHQ-9 Score 23  Difficult doing work/chores Extremely dIfficult     Other providers/specialists: Patient Care Team: Alexander Iba, [place] as PCP - General (Physician Assistant)    PMHx, SurgHx, SocialHx, Medications, and Allergies were reviewed in the Visit Navigator and updated as appropriate.   Past Medical History:  Diagnosis Date   Abnormal Pap smear of cervix    Allergy    Anxiety    Common migraine with intractable migraine 03/16/2018   Depression    Gonorrhea    Headache(784.0)    Infection    UTI   Shoulder dystocia, delivered, current hospitalization 06/14/2014   Vaginal Pap smear, abnormal    f/u ok     Past Surgical History:  Procedure Laterality Date   CESAREAN SECTION N/A    Phreesia 06/06/2020   CESAREAN SECTION WITH BILATERAL TUBAL LIGATION Bilateral 07/20/2016   Procedure: REPEAT  CESAREAN SECTION WITH BILATERAL TUBAL LIGATION;  Surgeon: Marylu Soda, MD;  Location: [facility];  Service: Obstetrics;  Laterality: Bilateral;  Provider requests RNFA -ap     Family History  Problem Relation Age of Onset   Diabetes Mother    Hearing loss Neg Hx     Social History   Tobacco Use   Smoking status: Every Day    Types: E-cigarettes   Smokeless tobacco: Never   Tobacco comments:    vape  Vaping Use   Vaping status: Never Used  Substance Use Topics   Alcohol use: Yes    Comment: occasionally   Drug use: Yes    Types: Marijuana    Comment: occasionally    Review of Systems:   Review of  Systems  Constitutional:  Negative for chills, fever, malaise/fatigue and weight loss.  HENT:  Negative for hearing loss, sinus pain and sore throat.   Respiratory:  Negative for cough and hemoptysis.   Cardiovascular:  Negative for chest pain, palpitations, leg swelling and PND.  Gastrointestinal:  Negative for abdominal pain, constipation, diarrhea, heartburn, nausea and vomiting.  Genitourinary:  Negative for dysuria, frequency and urgency.  Musculoskeletal:  Negative for back pain, myalgias and neck pain.  Skin:  Negative for itching and rash.  Neurological:  Negative for dizziness, tingling, seizures and headaches.  Endo/Heme/Allergies:  Negative for polydipsia.  Psychiatric/Behavioral:  Negative for depression. The patient is not nervous/anxious.       Objective:   BP 130/66   Temp 97.9 F (36.6 C) (Temporal)   Ht 5' 1 (1.549 m)   Wt 147 lb 12.8 oz (67 kg)   LMP 10/03/2023 (Approximate)   BMI 27.93 kg/m  Body mass index is 27.93 kg/m.   General Appearance:    Alert, cooperative, no distress, appears stated age  Head:    Normocephalic, without obvious abnormality, atraumatic  Eyes:    PERRL, conjunctiva/corneas clear, EOM's intact, fundi    benign, both eyes  Ears:    Normal TM's and external ear canals, both ears  Nose:   Nares normal, septum midline, mucosa normal, no drainage    or sinus tenderness  Throat:   Lips, mucosa, and tongue normal; teeth and gums normal  Neck:   Supple, symmetrical, trachea midline, no adenopathy;    thyroid:  no enlargement/tenderness/nodules; no carotid   bruit or JVD  Back:     Symmetric, no curvature, ROM normal, no CVA tenderness  Lungs:     Clear to auscultation bilaterally, respirations unlabored  Chest Wall:    No tenderness or deformity   Heart:    Regular rate and rhythm, S1 and S2 normal, no murmur, rub or gallop  Breast Exam:  Deferred  Abdomen:     Soft, non-tender, bowel sounds active all four quadrants,    no masses, no  organomegaly  Genitalia:    Deferred  Extremities:   Extremities normal, atraumatic, no cyanosis or edema  Pulses:   2+ and symmetric all extremities  Skin:   Skin color, texture, turgor normal, no rashes or lesions  Lymph nodes:   Cervical, supraclavicular, and axillary nodes normal  Neurologic:   CNII-XII intact, normal strength, sensation and reflexes    throughout    Assessment/Plan:   Routine physical examination Today patient counseled on age appropriate routine health concerns for screening and prevention, each reviewed and up to date or declined. Immunizations reviewed and up to date or declined.  Previous labs reviewed. Risk factors for  depression reviewed and negative. Hearing function and visual acuity are intact. ADLs screened and addressed as needed. Functional ability and level of safety reviewed and appropriate. Education, counseling and referrals performed based on assessed risks today. Patient provided with a copy of personalized plan for preventive services.  Depression, major, single episode, mild (HCC) Ongoing No red flags, denies SI/HI Recommend continued efforts at try to get in counseling  GAD (generalized anxiety disorder) Ongoing No red flags, denies SI/HI Recommend continued efforts at try to get in counseling Continue BuSpar  15 mg 3 times daily as needed  Dysmenorrhea Ongoing management per gynecology  Overweight Continue efforts at healthy lifestyle  Tobacco abuse Encouraged reduction, she is not ready to quit at this time    I, Timoteo Force, acting as a Neurosurgeon for [employer], [place]., have documented all relevant documentation on the behalf of Alexander Iba, [place], as directed by  Alexander Iba, PA while in the presence of Alexander Iba, [place].  I, Alexander Iba, [place], have reviewed all documentation for this visit. The documentation on 03/14/24 for the exam, diagnosis, procedures, and orders are all accurate and complete.  Alexander Iba,  PA-C  [facility]

## 2024-03-18 ENCOUNTER — Ambulatory Visit (INDEPENDENT_AMBULATORY_CARE_PROVIDER_SITE_OTHER): Admitting: Licensed Clinical Social Worker

## 2024-03-18 DIAGNOSIS — F329 Major depressive disorder, single episode, unspecified: Secondary | ICD-10-CM | POA: Diagnosis not present

## 2024-03-18 DIAGNOSIS — F411 Generalized anxiety disorder: Secondary | ICD-10-CM | POA: Diagnosis not present
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
## 2024-03-22 ENCOUNTER — Other Ambulatory Visit: Payer: Self-pay | Admitting: Physician Assistant

## 2024-03-22 DIAGNOSIS — J301 Allergic rhinitis due to pollen: Secondary | ICD-10-CM

## 2024-03-23 ENCOUNTER — Encounter: Payer: Self-pay | Admitting: Physician Assistant

## 2024-03-25 ENCOUNTER — Ambulatory Visit (INDEPENDENT_AMBULATORY_CARE_PROVIDER_SITE_OTHER): Admitting: Licensed Clinical Social Worker

## 2024-03-25 ENCOUNTER — Encounter: Payer: Self-pay | Admitting: Licensed Clinical Social Worker

## 2024-03-25 DIAGNOSIS — F329 Major depressive disorder, single episode, unspecified: Secondary | ICD-10-CM | POA: Diagnosis not present

## 2024-03-25 DIAGNOSIS — F411 Generalized anxiety disorder: Secondary | ICD-10-CM | POA: Diagnosis not present

## 2024-03-25 NOTE — Progress Notes (Signed)
 [facility] Counselor/Therapist Progress Note  Patient ID: [name] [name], MRN: [id number]   Date: 03/25/24  Time Spent: 2:00  pm - 3:03 pm : 63 Minutes  Treatment Type: Individual Therapy.  Reported Symptoms: flat affect, down, and tired  Mental Status Exam: Appearance:  Well Groomed     Behavior: Sharing  Motor: Normal  Speech/Language:  Normal Rate  Affect: Appropriate  Mood: normal  Thought process: normal  Thought content:   WNL  Sensory/Perceptual disturbances:   WNL  Orientation: oriented to person, place, and time/date  Attention: Good  Concentration: Good  Memory: WNL  Fund of knowledge:  Good  Insight:   Good  Judgment:  Good  Impulse Control: Good   Risk Assessment: Danger to Self:  No Self-injurious Behavior: No Danger to Others: No Duty to Warn:no Physical Aggression / Violence:No  Access to Firearms a concern: No  Gang Involvement:No   Subjective:   [name] participated from home, via video and consented to treatment. Therapist participated from home office. I discussed the limitations of evaluation and management by telemedicine and the availability of in person appointments. The patient expressed understanding and agreed to proceed. [name] reviewed the events of the past week.      We reviewed numerous treatment approaches including CBT and Solution focused therapy. Psych-education regarding the [name]'s diagnosis of No diagnosis found. was provided during the session. We discussed Onita Darra Lamm's goals treatment goals which include identify triggers, fears, and concerns driving behaviors and compartmentalize behaviors with a fresh beginning to tackle stressors.  Integration of self care strategies to reduce immediate anxiety and depression.  SABRA [name] provided verbal approval of the treatment plan.   Interventions: Psycho-education & Goal Setting.   Diagnosis:  GAD and Reactive Depression   Psychiatric  Treatment: No , N/A   Treatment Plan:  Client Abilities/Strengths Rylei Masella is open and receptive to sessions.    Support System: Family and Friends  Health and safety inspector  Treatment Level Weekly  Symptoms  Overwhelmed   (Status: declined) Uncertain   (Status: declined)  Goals:   Onita Darra agreed to set goal of  learning to identify triggers, fears, and concerns driving behaviors and compartmentalize behaviors with a fresh beginning to tackle stressors.  Integration of self care strategies to reduce immediate anxiety and depression.    Treatment plan signed and available on s-drive:  Yes    Target Date: 05/20/24 Frequency: Weekly  Progress: 0 Modality: individual    Therapist will provide referrals for additional resources as appropriate.  Therapist will provide psycho-education regarding [name]'s diagnosis and corresponding treatment approaches and interventions. Licensed Clinical Mental Health Counselor, Tawni Louder, [facility] will support the patient's ability to achieve the goals identified. will employ CBT, BA, Problem-solving, Solution Focused, Mindfulness,  coping skills, & other evidenced-based practices will be used to promote progress towards healthy functioning to help manage decrease symptoms associated with her diagnosis.   Reduce overall level, frequency, and intensity of the feelings of depression, anxiety and panic evidenced by increased self care and awareness of needs in order to reduce feelings of sadness and mental fatigue.   Verbally express understanding of the relationship between feelings of depression, anxiety and their impact on thinking patterns and behaviors. Verbalize an understanding of the role that distorted thinking plays in creating fears, excessive worry, and ruminations.   [name] participated in the creation of the treatment plan)   Dom Haverland, LCMHC

## 2024-04-03 ENCOUNTER — Encounter: Payer: Self-pay | Admitting: Physician Assistant

## 2024-04-03 ENCOUNTER — Ambulatory Visit: Payer: Self-pay | Admitting: Physician Assistant

## 2024-04-03 ENCOUNTER — Ambulatory Visit: Admitting: Physician Assistant

## 2024-04-03 VITALS — BP 130/82 | HR 71 | Temp 98.0°F | Ht 61.0 in | Wt 148.4 lb

## 2024-04-03 DIAGNOSIS — F320 Major depressive disorder, single episode, mild: Secondary | ICD-10-CM | POA: Diagnosis not present

## 2024-04-03 DIAGNOSIS — M79671 Pain in right foot: Secondary | ICD-10-CM | POA: Diagnosis not present

## 2024-04-03 DIAGNOSIS — R202 Paresthesia of skin: Secondary | ICD-10-CM | POA: Diagnosis not present

## 2024-04-03 DIAGNOSIS — E559 Vitamin D deficiency, unspecified: Secondary | ICD-10-CM

## 2024-04-03 LAB — VITAMIN D 25 HYDROXY (VIT D DEFICIENCY, FRACTURES): VITD: 15.44 ng/mL — ABNORMAL LOW (ref 30.00–100.00)

## 2024-04-03 LAB — B12 AND FOLATE PANEL
Folate: 9.4 ng/mL (ref >5.9)
Vitamin B-12: 374 pg/mL (ref 211–911)

## 2024-04-03 LAB — IBC + FERRITIN
Ferritin: 48.2 ng/mL (ref 10.0–291.0)
Iron: 67 ug/dL (ref 42–145)
Saturation Ratios: 19.9 % — ABNORMAL LOW (ref 20.0–50.0)
TIBC: 336 ug/dL (ref 250.0–450.0)
Transferrin: 240 mg/dL (ref 212.0–360.0)

## 2024-04-03 LAB — D-DIMER, QUANTITATIVE: D-Dimer, Quant: 0.34 ug{FEU}/mL (ref <0.50)

## 2024-04-03 NOTE — Progress Notes (Addendum)
 [name] [name] is a 38 y.o. female here for a new problem.  History of Present Illness:   Chief Complaint  Patient presents with   Medical Management of Chronic Issues    FOOT PAIN ONGOING FOR 3 WEEKS     HPI  Numbness in foot Pt complains of numbness in her right foot starting Monday 6/16, 2.5 weeks ago.  She states the numbness is mainly on the inner side and top of her right foot and radiates towards her toes. The outer part of her right foot and her left foot are normal, per pt. She reports it initially felt like her foot was asleep, but then noticed every time she went to bed it felt numb. She also reports when it is too hot or when she is fresh out of the shower, her foot feels as if there is fluid and swelling. Her foot will also feel like it is pulsating, but the swelling reduces gradually. Endorses working from home and sitting for 8 hours a day on her foot, but occasionally elevates her foot and increasing physical activity. She has not changed how she has been sitting because she reports that when she sits normally, she can feel blood rushing to her foot and pulsations. These sensations prompt her to return to sitting on her foot. She notes she has been out from work since Friday due to her depression. She has been seen by a therapist for the past two weeks. She also notes her hands fall asleep at night. She is on birth control and has had no periods or vaginal bleeding since December. Denies right foot pain, lower back pain or discomfort, recent motor vehicle collisions, or ankle swelling. Pt is established with neurology and would like her blood work checked.  Anxiety and Depression Symptom(s) are ongoing She has started seeing a therapist weekly Denies suicidal ideation/hi Her mood has been reduced and she is working on trying to figure out what is triggering her symptom(s) She has been out of work since Friday due to symptom(s)   Past Medical History:  Diagnosis  Date   Abnormal Pap smear of cervix    Allergy    Anxiety    Common migraine with intractable migraine 03/16/2018   Depression    Gonorrhea    Headache(784.0)    Infection    UTI   Shoulder dystocia, delivered, current hospitalization 06/14/2014   Vaginal Pap smear, abnormal    f/u ok     Social History   Tobacco Use   Smoking status: Every Day    Types: E-cigarettes   Smokeless tobacco: Never   Tobacco comments:    vape  Vaping Use   Vaping status: Never Used  Substance Use Topics   Alcohol use: Yes    Comment: occasionally   Drug use: Yes    Types: Marijuana    Comment: occasionally    Past Surgical History:  Procedure Laterality Date   CESAREAN SECTION N/A    Phreesia 06/06/2020   CESAREAN SECTION WITH BILATERAL TUBAL LIGATION Bilateral 07/20/2016   Procedure: REPEAT CESAREAN SECTION WITH BILATERAL TUBAL LIGATION;  Surgeon: Ovid All, MD;  Location: [facility];  Service: Obstetrics;  Laterality: Bilateral;  Provider requests RNFA -ap    Family History  Problem Relation Age of Onset   Diabetes Mother    Hearing loss Neg Hx     Allergies  Allergen Reactions   Pollen Extract Itching    Current Medications:   Current Outpatient Medications:  busPIRone  (BUSPAR ) 15 MG tablet, Take 1 tablet (15 mg total) by mouth 3 (three) times daily as needed., Disp: 30 tablet, Rfl: 1   diclofenac  (CATAFLAM ) 50 MG tablet, Take 1 tablet (50 mg total) by mouth 3 (three) times daily as needed (for migraine headache)., Disp: 30 tablet, Rfl: 11   fluticasone  (FLONASE ) 50 MCG/ACT nasal spray, Place 2 sprays into both nostrils daily., Disp: 16 g, Rfl: 2   Fremanezumab -vfrm (AJOVY ) 225 MG/1.5ML SOSY, Inject 225 mg into the skin every 30 (thirty) days., Disp: 1.5 mL, Rfl: 5   frovatriptan  (FROVA ) 2.5 MG tablet, Take 1 tablet (2.5 mg total) by mouth as needed for migraine. If recurs, may repeat after 2 hours. Max of 2 tabs in 24 hours., Disp: 10 tablet, Rfl: 5    levocetirizine (XYZAL ) 5 MG tablet, Take 1 tablet (5 mg total) by mouth every evening., Disp: 90 tablet, Rfl: 3   norethindrone  (AYGESTIN ) 5 MG tablet, TAKE 1 TABLET (5 MG TOTAL) BY MOUTH DAILY., Disp: 90 tablet, Rfl: 0   topiramate  (TOPAMAX ) 50 MG tablet, Take 3 tablets (150 mg total) by mouth at bedtime., Disp: 270 tablet, Rfl: 4   cetirizine  (ZYRTEC ) 10 MG tablet, TAKE 1 TABLET BY MOUTH EVERY DAY (Patient not taking: Reported on 04/03/2024), Disp: 90 tablet, Rfl: 0   montelukast  (SINGULAIR ) 10 MG tablet, TAKE 1 TABLET BY MOUTH EVERYDAY AT BEDTIME (Patient not taking: Reported on 04/03/2024), Disp: 90 tablet, Rfl: 0   Review of Systems:   Negative unless otherwise specified per HPI.  Vitals:   Vitals:   04/03/24 0944  BP: 130/82  Pulse: 71  Temp: 98 F (36.7 C)  TempSrc: Temporal  SpO2: 98%  Weight: 148 lb 6.4 oz (67.3 kg)  Height: 5' 1 (1.549 m)     Body mass index is 28.04 kg/m.  Physical Exam:   Physical Exam Constitutional:      Appearance: Normal appearance. She is well-developed.  HENT:     Head: Normocephalic and atraumatic.  Eyes:     General: Lids are normal.     Extraocular Movements: Extraocular movements intact.     Conjunctiva/sclera: Conjunctivae normal.  Cardiovascular:     Pulses:          Popliteal pulses are 2+ on the right side and 2+ on the left side.       Dorsalis pedis pulses are 2+ on the right side and 2+ on the left side.  Pulmonary:     Effort: Pulmonary effort is normal.  Musculoskeletal:        General: Normal range of motion.     Cervical back: Normal range of motion and neck supple.     Comments: Normal ROM of right foot No calf tenderness to palpation or swelling or erythema   Skin:    General: Skin is warm and dry.  Neurological:     Mental Status: She is alert and oriented to person, place, and time.  Psychiatric:        Attention and Perception: Attention and perception normal.        Mood and Affect: Mood normal.         Behavior: Behavior normal.        Thought Content: Thought content normal.        Judgment: Judgment normal.     Assessment and Plan:   1. Right foot pain (Primary) and 2. Paresthesia - D-Dimer, Quantitative  No red flags on exam DDx includes but is not limited  to: lumbar radiculopathy, B12 deficiency, positional intermittent nerve compression, dependent edema, deep vein thrombosis, venous insufficiency, neurological process among others Will update blood work of the rule out organic cause If D-dimer positive, will order stat ultrasound  If blood work wnl and symptom(s) persist, will refer to sports medicine vs have her follow up with her neurologist Consider trialing anti-inflammatory, keep leg elevated ER precautions advised  - B12 and Folate Panel - IBC + Ferritin - D-Dimer, Quantitative   3. Vitamin D deficiency - VITAMIN D 25 Hydroxy (Vit-D Deficiency, Fractures)  Update vitamin D and provide recommendations   4. Depression, major, single episode, mild (HCC)   Uncontrolled Denies suicidal ideation/hi Continue talk therapy She has missed work -- needs Industrial/product designer Act Engineer, maintenance (IT)) completed -- will complete when she sends to us   I, Lavern Simmers, acting as a Neurosurgeon for [employer], [place]., have documented all relevant documentation on the behalf of Lucie Buttner, [place], as directed by Lucie Buttner, PA while in the presence of Lucie Buttner, [place].  I, Lucie Buttner, [place], have reviewed all documentation for this visit. The documentation on 04/03/24 for the exam, diagnosis, procedures, and orders are all accurate and complete.  Lucie Buttner, PA-C

## 2024-04-03 NOTE — Patient Instructions (Signed)
 It was great to see you!  We will get blood work today to check for blood clot and assess for numbness/tingling  If you develop chest pain, shortness of breath or severe pain - please go to the ER   Take care,  Laysa Kimmey PA-C

## 2024-04-04 MED ORDER — VITAMIN D (ERGOCALCIFEROL) 1.25 MG (50000 UNIT) PO CAPS: 50000.0000 [IU] | ORAL_CAPSULE | ORAL | 0 refills | Status: DC

## 2024-04-08 ENCOUNTER — Ambulatory Visit: Admitting: Licensed Clinical Social Worker

## 2024-04-08 DIAGNOSIS — F411 Generalized anxiety disorder: Secondary | ICD-10-CM | POA: Diagnosis not present

## 2024-04-08 DIAGNOSIS — F329 Major depressive disorder, single episode, unspecified: Secondary | ICD-10-CM
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
## 2024-04-15 ENCOUNTER — Ambulatory Visit (INDEPENDENT_AMBULATORY_CARE_PROVIDER_SITE_OTHER): Admitting: Licensed Clinical Social Worker

## 2024-04-15 DIAGNOSIS — F411 Generalized anxiety disorder: Secondary | ICD-10-CM

## 2024-04-15 DIAGNOSIS — F329 Major depressive disorder, single episode, unspecified: Secondary | ICD-10-CM

## 2024-04-15 NOTE — Progress Notes (Signed)
   Behavioral Health Counselor/Therapist Progress Note  Patient ID: [name] [name], MRN: [id number]    Date: 04/15/24  Time Spent: 12:02  pm - 1:03 pm : 61 Minutes  Treatment Type: Individual Therapy.  Reported Symptoms: hurt by her husband saying she is not parenting his son as he would like, feels disconnected and not sure how to respond  Mental Status Exam: Appearance:  Well Groomed     Behavior: Sharing and Rationalizing  Motor: Normal  Speech/Language:  Normal Rate  Affect: Appropriate  Mood: irritable and sad  Thought process: normal  Thought content:   WNL  Sensory/Perceptual disturbances:   WNL  Orientation: oriented to person, place, and time/date  Attention: Good  Concentration: Good  Memory: WNL  Fund of knowledge:  Good  Insight:   Good  Judgment:  Good  Impulse Control: Good   Risk Assessment: Danger to Self:  No Self-injurious Behavior: No Danger to Others: No Duty to Warn:no Physical Aggression / Violence:No  Access to Firearms a concern: No  Gang Involvement:No   Subjective:   [name] [name] participated from home, via video, and consented to treatment. I discussed the limitations of evaluation and management by telemedicine and the availability of in person appointments. The patient expressed understanding and agreed to proceed.  Therapist participated from home office.  [name] [name] reviewed the events of the past week.      Interventions: Cognitive Behavioral Therapy and Solution-Oriented/Positive Psychology  Diagnosis:  Generalized anxiety disorder  Reactive depression  Psychiatric Treatment: No, N/A  Treatment Plan:  Client Abilities/Strengths [name] [name] is open to sessions.    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    Treatment Level Weekly  Symptoms  Tired   (Status:  maintained) Irritated   (Status: maintained)  Goals:   [name] [name] experiences symptoms of being in defense mode and protecting    Target Date: 05/20/24 Frequency: Weekly  Progress: 0 Modality: individual    Therapist will provide referrals for additional resources as appropriate.  Therapist will provide psycho-education regarding [name] [name]'s diagnosis and corresponding treatment approaches and interventions. Licensed Clinical Mental Health Counselor, Tawni Louder, [facility] will support the patient's ability to achieve the goals identified. will employ CBT, BA, Problem-solving, Solution Focused, Mindfulness,  coping skills, & other evidenced-based practices will be used to promote progress towards healthy functioning to help manage decrease symptoms associated with her diagnosis.   Reduce overall level, frequency, and intensity of the feelings of depression, anxiety and panic evidenced by decreased feelings of frustration at not advocating for what she needs.  Increased ability to manage emotions as her partner and she navigate marriage and co parenting after infidelity and discord.   Verbally express understanding of the relationship between feelings of depression, anxiety and their impact on thinking patterns and behaviors. Verbalize an understanding of the role that distorted thinking plays in creating fears, excessive worry, and ruminations.  [name] [name] participated in the creation of the treatment plan)   [name] [name], LCMHC

## 2024-04-22 ENCOUNTER — Ambulatory Visit: Admitting: Licensed Clinical Social Worker

## 2024-04-22 DIAGNOSIS — F411 Generalized anxiety disorder: Secondary | ICD-10-CM | POA: Diagnosis not present

## 2024-04-22 DIAGNOSIS — F329 Major depressive disorder, single episode, unspecified: Secondary | ICD-10-CM

## 2024-04-22 NOTE — Progress Notes (Signed)
  [facility] Counselor/Therapist Progress Note  Patient ID: Arlys Scatena, MRN: [id number]    Date: 04/22/24  Time Spent: 12:03  pm - 1:00 pm : 57 Minutes  Treatment Type: Individual Therapy.  Reported Symptoms: still battling burnout and fatigue as she is not feeling supported with children and husband competing chores/tasks  Mental Status Exam: Appearance:  Well Groomed     Behavior: Sharing, Rationalizing, and Blaming  Motor: Normal  Speech/Language:  Normal Rate  Affect: Appropriate  Mood: normal  Thought process: normal  Thought content:   WNL  Sensory/Perceptual disturbances:   WNL  Orientation: oriented to person, place, and time/date  Attention: Good  Concentration: Good  Memory: WNL  Fund of knowledge:  Good  Insight:   Good  Judgment:  Good  Impulse Control: Good   Risk Assessment: Danger to Self:  No Self-injurious Behavior: No Danger to Others: No Duty to Warn:no Physical Aggression / Violence:No  Access to Firearms a concern: No  Gang Involvement:No   Subjective:   [name] participated from car, via video, and consented to treatment. I discussed the limitations of evaluation and management by telemedicine and the availability of in person appointments. The patient expressed understanding and agreed to proceed.  Therapist participated from home office.  [name] reviewed the events of the past week.      Interventions: Cognitive Behavioral Therapy, Assertiveness/Communication, and Mindfulness Meditation  Diagnosis:  Generalized anxiety disorder  Reactive depression  Psychiatric Treatment: No , N/A  Treatment Plan:  Client Abilities/Strengths Amika Tassin is open to sessions.    Support System: Family and Friends  [employer] officer of Needs [name] would like to manage her marriage and parenting style.    Treatment Level Weekly  Symptoms  Overwhelmed   (Status:  maintained) Accommodating    (Status: declined)  Goals:   [name] experiences symptoms of fawning to accommodate others to control situations but denying and abandoning her needs.  Classical conditioning and behavior modification strategies.  Pause and respond looking for trust and safety in her partner.  Reinforcing created boundaries and delegating duties accordingly with consequences.     Target Date: 05/20/24 Frequency: Weekly  Progress: 0 Modality: individual    Therapist will provide referrals for additional resources as appropriate.  Therapist will provide psycho-education regarding [name]'s diagnosis and corresponding treatment approaches and interventions. Licensed Clinical Mental Health Counselor, Tawni Louder, [facility] will support the patient's ability to achieve the goals identified. will employ CBT, BA, Problem-solving, Solution Focused, Mindfulness,  coping skills, & other evidenced-based practices will be used to promote progress towards healthy functioning to help manage decrease symptoms associated with her diagnosis.   Reduce overall level, frequency, and intensity of the feelings of depression, anxiety and panic evidenced by decreased negative self talk and placement of needs.   Verbally express understanding of the relationship between feelings of depression, anxiety and their impact on thinking patterns and behaviors. Verbalize an understanding of the role that distorted thinking plays in creating fears, excessive worry, and ruminations.  [name] participated in the creation of the treatment plan)   Dezmond Downie, LCMHC

## 2024-04-23 ENCOUNTER — Telehealth: Payer: Self-pay

## 2024-04-23 NOTE — Telephone Encounter (Signed)
 Dr Glennon, please review pt's pap smear from January 2025 & advise.    Regarding pap recall

## 2024-04-26 NOTE — Telephone Encounter (Signed)
 This was from January

## 2024-04-29 ENCOUNTER — Ambulatory Visit: Admitting: Licensed Clinical Social Worker

## 2024-04-29 DIAGNOSIS — F411 Generalized anxiety disorder: Secondary | ICD-10-CM | POA: Diagnosis not present

## 2024-04-29 DIAGNOSIS — F329 Major depressive disorder, single episode, unspecified: Secondary | ICD-10-CM

## 2024-04-29 NOTE — Progress Notes (Signed)
 [facility] Counselor/Therapist Progress Note  Patient ID: [name] [name], MRN: [id number]    Date: 04/29/24  Time Spent: 12:02  pm - 1:09 pm : 67 Minutes  Treatment Type: Individual Therapy.  Reported Symptoms: trying to be more accommodating to her step son-co parenting with team   Mental Status Exam: Appearance:  Casual     Behavior: Rigid and Agitated  Motor: Normal  Speech/Language:  Normal Rate  Affect: Appropriate  Mood: normal  Thought process: normal  Thought content:   WNL  Sensory/Perceptual disturbances:   WNL  Orientation: oriented to person, place, and time/date  Attention: Good  Concentration: Good  Memory: WNL  Fund of knowledge:  Good  Insight:   Good  Judgment:  Good  Impulse Control: Good   Risk Assessment: Danger to Self:  No Self-injurious Behavior: No Danger to Others: No Duty to Warn:no Physical Aggression / Violence:No  Access to Firearms a concern: No  Gang Involvement:No   Subjective:   [name] [name] participated from car, via video, and consented to treatment. I discussed the limitations of evaluation and management by telemedicine and the availability of in person appointments. The patient expressed understanding and agreed to proceed.  Therapist participated from home office.  [name] [name] reviewed the events of the past week.      Interventions: Cognitive Behavioral Therapy and Assertiveness/Communication  Diagnosis:  Generalized anxiety disorder  Reactive depression  Psychiatric Treatment: No , N/A  Treatment Plan:  Client Abilities/Strengths [name] [name] is open to sessions.    Support System: Family and Friends   [employer]chant [employer] officer of Needs [name] [name] would like to manage her marriage and parenting style.     Treatment Level Weekly  Symptoms  Confused   (Status: maintained) Frustrated   (Status: maintained)  Goals:   [name] [name] experiences symptoms of frustration  at the lack of communication in learning about husbands gambling and mismanagement of funds.     Target Date: 05/20/24 Frequency: Weekly  Progress: 0 Modality: individual    Therapist will provide referrals for additional resources as appropriate.  Therapist will provide psycho-education regarding [name] [name]'s diagnosis and corresponding treatment approaches and interventions. Licensed Clinical Mental Health Counselor, Tawni Louder, [facility] will support the patient's ability to achieve the goals identified. will employ CBT, BA, Problem-solving, Solution Focused, Mindfulness,  coping skills, & other evidenced-based practices will be used to promote progress towards healthy functioning to help manage decrease symptoms associated with her diagnosis.   Reduce overall level, frequency, and intensity of the feelings of depression, anxiety and panic evidenced by decreased negative self talk and self image.   Verbally express understanding of the relationship between feelings of depression, anxiety and their impact on thinking patterns and behaviors. Verbalize an understanding of the role that distorted thinking plays in creating fears, excessive worry, and ruminations.  [name] [name] participated in the creation of the treatment plan)   Nivin Braniff, LCMHC

## 2024-05-02 NOTE — Telephone Encounter (Signed)
 Per review of EPIC, [place]  sent 10/23/23.   Left message to call [facility] Triage at [phone], option 4.

## 2024-05-07 ENCOUNTER — Ambulatory Visit (INDEPENDENT_AMBULATORY_CARE_PROVIDER_SITE_OTHER): Admitting: Licensed Clinical Social Worker

## 2024-05-07 DIAGNOSIS — F329 Major depressive disorder, single episode, unspecified: Secondary | ICD-10-CM | POA: Diagnosis not present

## 2024-05-07 DIAGNOSIS — F411 Generalized anxiety disorder: Secondary | ICD-10-CM

## 2024-05-07 NOTE — Progress Notes (Signed)
 [place] [facility] Counselor/Therapist Progress Note  Patient ID: [name] [name], MRN: [id number]    Date: 05/07/24  Time Spent: 3:04  pm - 4:00 pm : 56 Minutes  Treatment Type: Individual Therapy.  Reported Symptoms: explosive blow up with husband, lack of communication, angry and yelling explosive reactions when provoked, lack of support and mental fatigue  Mental Status Exam: Appearance:  Casual     Behavior: Appropriate and Sharing  Motor: Normal  Speech/Language:  Normal Rate  Affect: Appropriate  Mood: normal  Thought process: normal  Thought content:   WNL  Sensory/Perceptual disturbances:   WNL  Orientation: oriented to person, place, and time/date  Attention: Good  Concentration: Good  Memory: WNL  Fund of knowledge:  Good  Insight:   Good  Judgment:  Good  Impulse Control: Good   Risk Assessment: Danger to Self:  No Self-injurious Behavior: No Danger to Others: No Duty to Warn:no Physical Aggression / Violence:No  Access to Firearms a concern: No  Gang Involvement:No   Subjective:   [name] [name] participated from home, via video, and consented to treatment. I discussed the limitations of evaluation and management by telemedicine and the availability of in person appointments. The patient expressed understanding and agreed to proceed.  Therapist participated from home office.  [name] [name] reviewed the events of the past week.      Interventions: Cognitive Behavioral Therapy and Insight-Oriented  Diagnosis:  GAD Reactive Depression  Psychiatric Treatment: No , N/A  Treatment Plan:  Client Abilities/Strengths [name] [name] is open to sessions.  Support System: Family and Friends   [employer]chant [employer] officer of Needs [name] [name] would like to manage her marriage and parenting style.    Treatment Level Weekly  Symptoms  Stagnant   (Status: maintained) Observant/Reflective   (Status: improved)  Goals:   [name]  [name] experiences symptoms of not being clear on what stance she should be taking with co-parenting her husbands son.  Discussed sessions can be more effective if she and her husband and she could communicate.  Husband is not open to therapy.  Utilizing strategies but very stagnant as the dynamics of her household and top down stressors stem from disconnection in her marriage.  Husband is very critical and not supportive.     Target Date: 05/20/24 Frequency: Weekly  Progress: 0 Modality: individual    Therapist will provide referrals for additional resources as appropriate.  Therapist will provide psycho-education regarding [name] [name]'s diagnosis and corresponding treatment approaches and interventions. Licensed Clinical Mental Health Counselor, Tawni Louder, [facility] will support the patient's ability to achieve the goals identified. will employ CBT, BA, Problem-solving, Solution Focused, Mindfulness,  coping skills, & other evidenced-based practices will be used to promote progress towards healthy functioning to help manage decrease symptoms associated with her diagnosis.   Reduce overall level, frequency, and intensity of the feelings of depression, anxiety and panic evidenced by decreased negative thoughts driving depression from 6 to 7 days/week to 0 to 1 days/week per client report for at least 3 consecutive months. Verbally express understanding of the relationship between feelings of depression, anxiety and their impact on thinking patterns and behaviors. Verbalize an understanding of the role that distorted thinking plays in creating fears, excessive worry, and ruminations.  [name] [name] participated in the creation of the treatment plan)   [name] [name], LCMHC

## 2024-05-13 NOTE — Telephone Encounter (Signed)
 Left detailed message, ok per dpr. Advised f/u to schedule 6 mo f/u pap, return call to office at [phone], opt 4 to further discuss.

## 2024-05-14 ENCOUNTER — Ambulatory Visit: Admitting: Licensed Clinical Social Worker

## 2024-05-14 DIAGNOSIS — F329 Major depressive disorder, single episode, unspecified: Secondary | ICD-10-CM

## 2024-05-14 DIAGNOSIS — F411 Generalized anxiety disorder: Secondary | ICD-10-CM

## 2024-05-14 NOTE — Progress Notes (Signed)
 [place] [facility] [place] [facility] Counselor/Therapist Progress Note  Patient ID: [name] [name], MRN: [id number]    Date: 05/14/24  Time Spent: 11:05  am - 12:02 pm : 57 Minutes  Treatment Type: Individual Therapy.  Reported Symptoms: anxious about finances   Mental Status Exam: Appearance:  Well Groomed     Behavior: Appropriate and Sharing  Motor: Normal  Speech/Language:  Normal Rate  Affect: Appropriate  Mood: normal  Thought process: normal  Thought content:   WNL  Sensory/Perceptual disturbances:   WNL  Orientation: oriented to person, place, and time/date  Attention: Good  Concentration: Good  Memory: WNL  Fund of knowledge:  Good  Insight:   Good  Judgment:  Good  Impulse Control: Good   Risk Assessment: Danger to Self:  No Self-injurious Behavior: No Danger to Others: No Duty to Warn:no Physical Aggression / Violence:No  Access to Firearms a concern: No  Gang Involvement:No   Subjective:   [name] [name] participated in the session, in person in the office with the therapist, and consented to treatment. [name] [name] reviewed the events of the past week.      Interventions: Assertiveness/Communication and Solution-Oriented/Positive Psychology  Diagnosis:   GAD Reactive Depression   Psychiatric Treatment: No , N/A  Treatment Plan:  Client Abilities/Strengths [name] [name] is open to sessions.    Support System: Family and Friends   Client Treatment Preferences Hybrid  Client Statement of Needs [name] [name] would like to manage her marriage and parenting style.     Treatment Level Weekly  Symptoms  Fearful   (Status: maintained) Angry   (Status: maintained)  Goals:   [name] [name] experiences symptoms of feeling more grounded within self by taking more walks and disconnecting from some levels of parenting due to the lack of support from her husband.     Target Date: 05/20/24 Frequency: Weekly  Progress: 0 Modality: individual    Therapist will  provide referrals for additional resources as appropriate.  Therapist will provide psycho-education regarding [name] [name]'s diagnosis and corresponding treatment approaches and interventions. Licensed Clinical Mental Health Counselor, Tawni Louder, [facility] will support the patient's ability to achieve the goals identified. will employ CBT, BA, Problem-solving, Solution Focused, Mindfulness,  coping skills, & other evidenced-based practices will be used to promote progress towards healthy functioning to help manage decrease symptoms associated with her diagnosis.   Reduce overall level, frequency, and intensity of the feelings of depression, anxiety and panic evidenced by decreased fear of utilizing her voice.   Verbally express understanding of the relationship between feelings of depression, anxiety and their impact on thinking patterns and behaviors. Verbalize an understanding of the role that distorted thinking plays in creating fears, excessive worry, and ruminations.  [name] [name] participated in the creation of the treatment plan)   [name] [name], LCMHC

## 2024-05-21 ENCOUNTER — Ambulatory Visit: Admitting: Licensed Clinical Social Worker

## 2024-05-21 DIAGNOSIS — F411 Generalized anxiety disorder: Secondary | ICD-10-CM

## 2024-05-21 DIAGNOSIS — F329 Major depressive disorder, single episode, unspecified: Secondary | ICD-10-CM

## 2024-05-21 NOTE — Progress Notes (Signed)
 [place] [facility] Counselor/Therapist Progress Note  Patient ID: [name] [name], MRN: [id number]    Date: 05/21/24  Time Spent: 2:00  pm - 2:59 pm : 59 Minutes  Treatment Type: Individual Therapy.  Reported Symptoms: busy with work, not getting enough sleep, brain overload, cat naps   Mental Status Exam: Appearance:  Casual     Behavior: Appropriate and Sharing  Motor: Normal  Speech/Language:  Normal Rate  Affect: Appropriate  Mood: angry, anxious, and irritable  Thought process: circumstantial  Thought content:   WNL  Sensory/Perceptual disturbances:   WNL and Headache   Orientation: oriented to person, place, and time/date  Attention: Good  Concentration: Good  Memory: WNL  Fund of knowledge:  Good  Insight:   Good  Judgment:  Good  Impulse Control: Good   Risk Assessment: Danger to Self:  No Self-injurious Behavior: No Danger to Others: No Duty to Warn:no Physical Aggression / Violence:No  Access to Firearms a concern: No  Gang Involvement:No   Subjective:   [name] [name] participated from community, via video, and consented to treatment. I discussed the limitations of evaluation and management by telemedicine and the availability of in person appointments. The patient expressed understanding and agreed to proceed.  Therapist participated from home office.  [name] [name] reviewed the events of the past week.      Interventions: Mindfulness Meditation, Solution-Oriented/Positive Psychology, and Insight-Oriented  Diagnosis:  GAD Reactive Depression   Psychiatric Treatment: No , N/A  Treatment Plan:  Client Abilities/Strengths [name] [name] is open to sessions.    Support System: Family and Friends   Client Treatment Preferences Hybrid  Client Statement of Needs [name] [name] would like to set another goal and continue services.     Treatment Level Biweekly  Symptoms  Exhausted   (Status: declined) Sleep deprived   (Status: declined)  Goals:    [name] [name] experiences symptoms of migraines and sleep disturbances due to her husbands schedule. Goal recap-more aware of self and surroundings that she had not paid attention to before, the conditioning session helped her understand that she has conditioned people to treat and respond to her in a particular way.  She now sees the accountability for the part she played in the events.  Focuses on making family members take accountability as well, more assertive than before and standing up for herself.  Still recognizes he deficit with husband and his resistance to couples therapy.     Target Date: 05/21/24 Frequency: Weekly  Progress: 70% Modality: individual    Therapist will provide referrals for additional resources as appropriate.  Therapist will provide psycho-education regarding [name] [name]'s diagnosis and corresponding treatment approaches and interventions. Licensed Clinical Mental Health Counselor, [name] [name], [facility] will support the patient's ability to achieve the goals identified. will employ CBT, BA, Problem-solving, Solution Focused, Mindfulness,  coping skills, & other evidenced-based practices will be used to promote progress towards healthy functioning to help manage decrease symptoms associated with her diagnosis.   Reduce overall level, frequency, and intensity of the feelings of depression, anxiety and panic evidenced by increased accountability, self awareness, and boundary setting.   Verbally express understanding of the relationship between feelings of depression, anxiety and their impact on thinking patterns and behaviors. Verbalize an understanding of the role that distorted thinking plays in creating fears, excessive worry, and ruminations.  [name] [name] participated in the creation of the treatment plan)   Natalio Salois, LCMHC

## 2024-05-25 ENCOUNTER — Other Ambulatory Visit: Payer: Self-pay | Admitting: Obstetrics and Gynecology

## 2024-05-27 ENCOUNTER — Ambulatory Visit: Admitting: Licensed Clinical Social Worker

## 2024-05-27 DIAGNOSIS — F331 Major depressive disorder, recurrent, moderate: Secondary | ICD-10-CM

## 2024-05-27 DIAGNOSIS — F329 Major depressive disorder, single episode, unspecified: Secondary | ICD-10-CM

## 2024-05-27 DIAGNOSIS — F411 Generalized anxiety disorder: Secondary | ICD-10-CM

## 2024-05-27 NOTE — Telephone Encounter (Signed)
.  Med refill request: Norethindrone   Last AEX:10/19/23 Next AEX: not scheduled  Last MMG (if hormonal med) Refill authorized: Please Advise?

## 2024-05-27 NOTE — Progress Notes (Signed)
 [facility] Counselor/Therapist Progress Note  Patient ID: [name] [name], MRN: [id number]    Date: 05/27/24  Time Spent: 12:02  pm - 12:59 pm : 58 Minutes  Treatment Type: Individual Therapy.  Reported Symptoms: sick with a cold and has been on the go, no time for self and to rest has her fatigued and not feeling her best   Mental Status Exam: Appearance:  Casual     Behavior: Appropriate  Motor: Normal  Speech/Language:  Normal Rate  Affect: Appropriate  Mood: normal  Thought process: normal  Thought content:   WNL  Sensory/Perceptual disturbances:   WNL  Orientation: oriented to person, place, and time/date  Attention: Good  Concentration: Good  Memory: WNL  Fund of knowledge:  Good  Insight:   Good  Judgment:  Good  Impulse Control: Good   Risk Assessment: Danger to Self:  No Self-injurious Behavior: No Danger to Others: No Duty to Warn:no Physical Aggression / Violence:No  Access to Firearms a concern: No  Gang Involvement:No   Subjective:   [name] [name] participated from home, via video, and consented to treatment. I discussed the limitations of evaluation and management by telemedicine and the availability of in person appointments. The patient expressed understanding and agreed to proceed.  Therapist participated from home office.  [name] [name] reviewed the events of the past week.      Interventions: Cognitive Behavioral Therapy  Diagnosis:  No diagnosis found.  Psychiatric Treatment: No , N/A  Treatment Plan:  Client Abilities/Strengths [name] [name] is open to sessions.    Support System: Family and Friends   Client Treatment Preferences Hybrid  Client Statement of Needs [name] [name] would like to set another goal and continue services.      Treatment Level Weekly  Symptoms  Sick   (Status: declined) Tired   (Status: declined)  Goals:   [name] [name] experiences symptoms of reducing her yelling and outbursts.  She has been more  controlled and had one incident the night before school started yesterday but was able to manage this better.  Has been more supportive and responsive in parenting her step son.  She has been firm in her boundary that his previous behavior warranted her to withhold coloring his hair.  Spoke with her brother about how their mom is not aware of the pain she inflicted making the racial comments.  Operant versus Classical conditioning to attend to parenting differences.  Process what changes you are ready to make with parenting and confronting mom and will create new focus goal in next session.     Target Date: 05/27/24 Frequency: Weekly  Progress: 0 Modality: individual    Therapist will provide referrals for additional resources as appropriate.  Therapist will provide psycho-education regarding [name] [name]'s diagnosis and corresponding treatment approaches and interventions. Licensed Clinical Mental Health Counselor, Tawni Louder, [facility] will support the patient's ability to achieve the goals identified. will employ CBT, BA, Problem-solving, Solution Focused, Mindfulness,  coping skills, & other evidenced-based practices will be used to promote progress towards healthy functioning to help manage decrease symptoms associated with her diagnosis.   Reduce overall level, frequency, and intensity of the feelings of depression, anxiety and panic evidenced by increased emotional resilience and ability to maintain and reinforce boundaries in parenting.   Verbally express understanding of the relationship between feelings of depression, anxiety and their impact on thinking patterns and behaviors. Verbalize an understanding of the role that distorted thinking plays in creating fears, excessive worry,  and ruminations.  [name] [name] participated in the creation of the treatment plan)   [name], LCMHC

## 2024-05-28 ENCOUNTER — Encounter: Payer: Self-pay | Admitting: Physician Assistant

## 2024-05-28 ENCOUNTER — Encounter: Payer: Self-pay | Admitting: Obstetrics and Gynecology

## 2024-05-28 ENCOUNTER — Ambulatory Visit (INDEPENDENT_AMBULATORY_CARE_PROVIDER_SITE_OTHER): Admitting: Physician Assistant

## 2024-05-28 VITALS — BP 120/86 | HR 78 | Temp 97.3°F | Ht 61.0 in | Wt 147.4 lb

## 2024-05-28 DIAGNOSIS — R202 Paresthesia of skin: Secondary | ICD-10-CM

## 2024-05-28 DIAGNOSIS — F320 Major depressive disorder, single episode, mild: Secondary | ICD-10-CM | POA: Diagnosis not present

## 2024-05-28 DIAGNOSIS — J029 Acute pharyngitis, unspecified: Secondary | ICD-10-CM

## 2024-05-28 LAB — POCT RAPID STREP A (OFFICE): Rapid Strep A Screen: POSITIVE — AB

## 2024-05-28 MED ORDER — AMOXICILLIN 500 MG PO CAPS
500.0000 mg | ORAL_CAPSULE | Freq: Two times a day (BID) | ORAL | 0 refills | Status: AC
Start: 2024-05-28 — End: 2024-06-07

## 2024-05-28 MED ORDER — HYDROCOD POLI-CHLORPHE POLI ER 10-8 MG/5ML PO SUER: 5.0000 mL | Freq: Every evening | ORAL | 0 refills | Status: DC | PRN

## 2024-05-28 NOTE — Progress Notes (Signed)
 [name] [name] is a 38 y.o. female here for a follow up of a pre-existing problem.  History of Present Illness:   Chief Complaint  Patient presents with   Cough    Pt c/o cough, sore throat and congestion started on Thursday. Denies fever or chills.    Discussed the use of AI scribe software for clinical note transcription with the patient, who gave verbal consent to proceed.  History of Present Illness [name] [name] is a 38 year old female who presents with a sore throat and cough, confirmed as strep throat.  Symptoms began on Thursday with a cough and sore throat following her son's football practice. The throat pain is severe, especially at night when lying down, and is accompanied by chest congestion. She uses cough drops, an immunity shot, Nyquil at night, and alternates ibuprofen  and Tylenol  for pain management and sleep aid.  Her symptoms significantly impact daily life, preventing rest due to responsibilities such as taking her children shopping and attending school events. Her son has also started coughing, raising concerns about potential transmission. Her symptoms affect her ability to work.  She also wanted to follow up with us  about numbness of her right foot -- started middle of June -- refer to note on 04/03/24 for further details.   Past Medical History:  Diagnosis Date   Abnormal Pap smear of cervix    Allergy    Anxiety    Common migraine with intractable migraine 03/16/2018   Depression    Gonorrhea    Headache(784.0)    Infection    UTI   Shoulder dystocia, delivered, current hospitalization 06/14/2014   Vaginal Pap smear, abnormal    f/u ok     Social History   Tobacco Use   Smoking status: Every Day    Types: E-cigarettes   Smokeless tobacco: Never   Tobacco comments:    vape  Vaping Use   Vaping status: Never Used  Substance Use Topics   Alcohol use: Yes    Comment: occasionally   Drug use: Yes    Types: Marijuana    Comment: occasionally     Past Surgical History:  Procedure Laterality Date   CESAREAN SECTION N/A    [employer] 06/06/2020   CESAREAN SECTION WITH BILATERAL TUBAL LIGATION Bilateral 07/20/2016   Procedure: REPEAT CESAREAN SECTION WITH BILATERAL TUBAL LIGATION;  Surgeon: Ovid All, MD;  Location: [facility];  Service: Obstetrics;  Laterality: Bilateral;  Provider requests RNFA -ap    Family History  Problem Relation Age of Onset   Diabetes Mother    Hearing loss Neg Hx     Allergies  Allergen Reactions   Pollen Extract Itching    Current Medications:   Current Outpatient Medications:    amoxicillin  (AMOXIL ) 500 MG capsule, Take 1 capsule (500 mg total) by mouth 2 (two) times daily for 10 days., Disp: 20 capsule, Rfl: 0   busPIRone  (BUSPAR ) 15 MG tablet, Take 1 tablet (15 mg total) by mouth 3 (three) times daily as needed., Disp: 30 tablet, Rfl: 1   chlorpheniramine-HYDROcodone (TUSSIONEX) 10-8 MG/5ML, Take 5 mLs by mouth at bedtime as needed., Disp: 115 mL, Rfl: 0   diclofenac  (CATAFLAM ) 50 MG tablet, Take 1 tablet (50 mg total) by mouth 3 (three) times daily as needed (for migraine headache)., Disp: 30 tablet, Rfl: 11   fluticasone  (FLONASE ) 50 MCG/ACT nasal spray, Place 2 sprays into both nostrils daily., Disp: 16 g, Rfl: 2   Fremanezumab -vfrm (AJOVY ) 225 MG/1.5ML  SOSY, Inject 225 mg into the skin every 30 (thirty) days., Disp: 1.5 mL, Rfl: 5   frovatriptan  (FROVA ) 2.5 MG tablet, Take 1 tablet (2.5 mg total) by mouth as needed for migraine. If recurs, may repeat after 2 hours. Max of 2 tabs in 24 hours., Disp: 10 tablet, Rfl: 5   levocetirizine (XYZAL ) 5 MG tablet, Take 1 tablet (5 mg total) by mouth every evening., Disp: 90 tablet, Rfl: 3   norethindrone  (AYGESTIN ) 5 MG tablet, TAKE 1 TABLET (5 MG TOTAL) BY MOUTH DAILY., Disp: 90 tablet, Rfl: 0   topiramate  (TOPAMAX ) 50 MG tablet, Take 3 tablets (150 mg total) by mouth at bedtime., Disp: 270 tablet, Rfl: 4   Vitamin D , Ergocalciferol ,  (DRISDOL ) 1.25 MG (50000 UNIT) CAPS capsule, Take 1 capsule (50,000 Units total) by mouth every 7 (seven) days., Disp: 12 capsule, Rfl: 0   Review of Systems:   Negative unless otherwise specified per HPI.  Vitals:   Vitals:   05/28/24 1056  BP: 120/86  Pulse: 78  Temp: (!) 97.3 F (36.3 C)  TempSrc: Temporal  SpO2: 97%  Weight: 147 lb 6.1 oz (66.9 kg)  Height: 5' 1 (1.549 m)     Body mass index is 27.85 kg/m.  Physical Exam:   Physical Exam Vitals and nursing note reviewed.  Constitutional:      General: She is not in acute distress.    Appearance: She is well-developed. She is not ill-appearing or toxic-appearing.  HENT:     Head: Normocephalic and atraumatic.     Right Ear: Tympanic membrane, ear canal and external ear normal. Tympanic membrane is not erythematous, retracted or bulging.     Left Ear: Tympanic membrane, ear canal and external ear normal. Tympanic membrane is not erythematous, retracted or bulging.     Nose: Nose normal.     Right Sinus: No maxillary sinus tenderness or frontal sinus tenderness.     Left Sinus: No maxillary sinus tenderness or frontal sinus tenderness.     Mouth/Throat:     Pharynx: Uvula midline. Posterior oropharyngeal erythema present.     Tonsils: 1+ on the right. 1+ on the left.  Eyes:     General: Lids are normal.     Conjunctiva/sclera: Conjunctivae normal.  Neck:     Trachea: Trachea normal.  Cardiovascular:     Rate and Rhythm: Normal rate and regular rhythm.     Heart sounds: Normal heart sounds, S1 normal and S2 normal.  Pulmonary:     Effort: Pulmonary effort is normal.     Breath sounds: Normal breath sounds. No decreased breath sounds, wheezing, rhonchi or rales.  Lymphadenopathy:     Cervical: No cervical adenopathy.  Skin:    General: Skin is warm and dry.  Neurological:     Mental Status: She is alert.  Psychiatric:        Speech: Speech normal.        Behavior: Behavior normal. Behavior is cooperative.      Assessment and Plan:   Assessment and Plan Assessment & Plan Streptococcal pharyngitis Acute streptococcal pharyngitis confirmed by positive strep test. Symptoms include severe sore throat, cough, congestion, and nasal breathing difficulty. - Prescribe antibiotics. - Recommend ibuprofen  for pain. - Advise Delsym for daytime cough. - Prescribe codeine  cough syrup for nighttime.  Numbness of foot Persistent numbness with no improvement from previous interventions. - Refer to sports medicine.  Depression and generalized anxiety disorder Ongoing management with progress in therapy. Therapist suggested couples  therapy to improve communication and alleviate anxiety. - Continue current therapy sessions. - Encourage discussion with husband about couples therapy. - Support ongoing identification and management of triggers.     Lucie Buttner, PA-C

## 2024-05-28 NOTE — Telephone Encounter (Signed)
 FMLA completed and faxed to [employer] at [phone].

## 2024-05-28 NOTE — Patient Instructions (Addendum)
  VISIT SUMMARY: Today, you were seen for a sore throat and cough, which was confirmed as strep throat. We also discussed your ongoing foot numbness and your management plan for depression and anxiety.  YOUR PLAN: STREPTOCOCCAL PHARYNGITIS: You have a confirmed case of strep throat, which is causing a severe sore throat, cough, congestion, and difficulty breathing through your nose. -Start taking the prescribed antibiotics as directed. -Use ibuprofen  for pain relief. -Take Delsym during the day to help with your cough. -Use the prescribed codeine  cough syrup at night to help you sleep.  NUMBNESS OF FOOT: You have persistent numbness in your foot that has not improved with previous treatments. -You will be referred to sports medicine for further evaluation.  DEPRESSION AND GENERALIZED ANXIETY DISORDER: You are making progress in therapy for depression and anxiety. Your therapist has suggested couples therapy to help improve communication and reduce anxiety. -Continue with your current therapy sessions. -Discuss the possibility of couples therapy with your husband. -Keep working on identifying and managing your anxiety triggers.                      Contains text generated by [employer].                                 Contains text generated by [employer].

## 2024-06-04 ENCOUNTER — Ambulatory Visit: Admitting: Licensed Clinical Social Worker

## 2024-06-04 DIAGNOSIS — F331 Major depressive disorder, recurrent, moderate: Secondary | ICD-10-CM

## 2024-06-04 DIAGNOSIS — F411 Generalized anxiety disorder: Secondary | ICD-10-CM

## 2024-06-04 NOTE — Progress Notes (Signed)
 [facility] Counselor/Therapist Progress Note  Patient ID: [name] [name], MRN: [id number]    Date: 06/04/24  Time Spent: 3:01  pm - 4:05 pm : 64 Minutes  Treatment Type: Individual Therapy.  Reported Symptoms: still recovering from Strept throat, fatigued and frustrated with the poor communication with her step son and her husband's expectations on her   Mental Status Exam: Appearance:  Casual     Behavior: Appropriate and Sharing  Motor: Normal  Speech/Language:  Normal Rate  Affect: Appropriate  Mood: normal  Thought process: normal  Thought content:   WNL  Sensory/Perceptual disturbances:   WNL  Orientation: oriented to person, place, and time/date  Attention: Good  Concentration: Good  Memory: WNL  Fund of knowledge:  Good  Insight:   Good  Judgment:  Good  Impulse Control: Good   Risk Assessment: Danger to Self:  No Self-injurious Behavior: No Danger to Others: No Duty to Warn:no Physical Aggression / Violence:No  Access to Firearms a concern: No  Gang Involvement:No   Subjective:   [name] [name] participated from car, via video, and consented to treatment. I discussed the limitations of evaluation and management by telemedicine and the availability of in person appointments. The patient expressed understanding and agreed to proceed.  Therapist participated from home office.  [name] [name] reviewed the events of the past week.    Interventions: Assertiveness/Communication, Mindfulness Meditation, and Solution-Oriented/Positive Psychology  Diagnosis:  GAD MDD Moderate Recurrent  Psychiatric Treatment: No , N/A  Treatment Plan:  Client Abilities/Strengths [name] [name] is open to sessions.    Support System: Family and Friends   Scientist, research (physical sciences) Family and Friends  Theatre manager of Needs [name] [name] would like to set another goal and continue services.    Treatment Level Weekly  Symptoms  Fatigued   (Status:  maintained) Defeated/Frustrated   (Status: maintained)  Goals:   [name] [name] experiences symptoms of feeling guilty about doing things for her step son to appease his father and him.  This compromises her ad she bears the frustration and backlash.  Financial stress and concerned about money.  Husband overcompensates and she is the bad guy since they are not always in agreement.  New goal will be discussed in next session along with closeout review.     Target Date: 06/04/24 Frequency: Weekly  Progress: 0 Modality: individual    Therapist will provide referrals for additional resources as appropriate.  Therapist will provide psycho-education regarding [name] [name]'s diagnosis and corresponding treatment approaches and interventions. Licensed Clinical Mental Health Counselor, Tawni Louder, [facility] will support the patient's ability to achieve the goals identified. will employ CBT, BA, Problem-solving, Solution Focused, Mindfulness,  coping skills, & other evidenced-based practices will be used to promote progress towards healthy functioning to help manage decrease symptoms associated with her diagnosis.   Reduce overall level, frequency, and intensity of the feelings of depression, anxiety and panic evidenced by increased emotional intelligence to know what her boundaries are and enforce them for better communication dynamics within her immediate family.   Verbally express understanding of the relationship between feelings of depression, anxiety and their impact on thinking patterns and behaviors. Verbalize an understanding of the role that distorted thinking plays in creating fears, excessive worry, and ruminations.  [name] [name] participated in the creation of the treatment plan)   Bradee Common, LCMHC

## 2024-06-06 NOTE — Telephone Encounter (Signed)
 Pt is scheduled for a PAP on 07/09/24 with Dr.Boswell.

## 2024-06-11 ENCOUNTER — Encounter: Payer: Self-pay | Admitting: Licensed Clinical Social Worker

## 2024-06-11 ENCOUNTER — Ambulatory Visit: Admitting: Licensed Clinical Social Worker

## 2024-06-19 ENCOUNTER — Telehealth (INDEPENDENT_AMBULATORY_CARE_PROVIDER_SITE_OTHER): Admitting: Neurology

## 2024-06-19 DIAGNOSIS — G43019 Migraine without aura, intractable, without status migrainosus: Secondary | ICD-10-CM

## 2024-06-19 DIAGNOSIS — G43709 Chronic migraine without aura, not intractable, without status migrainosus: Secondary | ICD-10-CM | POA: Diagnosis not present

## 2024-06-19 DIAGNOSIS — G43009 Migraine without aura, not intractable, without status migrainosus: Secondary | ICD-10-CM

## 2024-06-19 MED ORDER — FROVATRIPTAN SUCCINATE 2.5 MG PO TABS: 2.5000 mg | ORAL_TABLET | ORAL | 11 refills | Status: AC | PRN

## 2024-06-19 MED ORDER — AJOVY 225 MG/1.5ML ~~LOC~~ SOSY: 225.0000 mg | PREFILLED_SYRINGE | SUBCUTANEOUS | 11 refills | Status: AC

## 2024-06-19 NOTE — Progress Notes (Signed)
 PATIENT: [name] [name] DOB: 1986/05/04  REASON FOR VISIT: Follow up for migraine HISTORY FROM: Patient PRIMARY NEUROLOGIST: Dr. Chima/Dr. Buck  Virtual Visit via Video Note  I connected with [name] [name] on 06/19/24 at  3:45 PM EDT by a video enabled telemedicine application and verified that I am speaking with the correct person using two identifiers.  Location: Patient: at her home Provider: in the office    I discussed the limitations of evaluation and management by telemedicine and the availability of in person appointments. The patient expressed understanding and agreed to proceed.  HISTORY OF PRESENT ILLNESS: Today 06/19/24 06/19/24 SS: Saw Dr. Buck in March 2025, HST ordered. Restarted Ajovy , Frova  PRN. Has not had Ajovy  since June. She moved to [place], had mix up with pharmacy. No longer on Topamax , made her feel groggy. Has Frova  PRN. Has 4-5 migraines a month, can last all day. Allergies, lack of sleep triggers. Dr. Buck ordered sleep study, but hasn't scheduled yet. When she sleeps, does not feel restored, wants to take a nap during the day, she can sleep whenever. Husband says she sounds like she is struggling to breath at night.   11/02/22 Dr. Rush: She stopped her medications in June due to financial issues. Has not been on any medications for the past 6 months. She has been taking Tylenol  as needed which takes the edge off. She's currently averaging 2-3 migraines per month, which can last up to 2-3 days at a time. She was previously taking Topamax , Ajovy , and frovatriptan  and would like to restart her medications today.   11/04/21 SS: [name] is here today for follow-up with history of intractable migraine headache. On average reporting about 1 migraine a week.  Topamax  was increased at last visit, she has done well with this.  She remains on Ajovy .  For acute headache, she will take Frova .  Usually starts to work within 30 minutes to 1 hour.  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  She may miss 1 to 2 days of work a month due to migraine.  She has 3 kids.  She is currently pleased with her migraine control.  She did not pick up the diclofenac  potassium.  Has not been able to tolerate nortriptyline  due to excessive drowsiness.  HISTORY  07/01/2021 Dr. Jenel: [name] is a 38 year old right-handed female with a history of intractable migraine headache.  She had done quite a bit better on Ajovy , she was also on nortriptyline  and Topamax .  She stopped the nortriptyline  at a 50 mg dose in August 2022.  Within a few weeks, she began developing fairly severe bifrontal headaches that are essentially daily at this point.  The patient indicates that the headaches are in the same location as her usual migraines but are more severe and associated with severe photophobia.  The patient does not have nausea or vomiting but she may have some queasiness.  The headaches are throbbing in nature.  She does have some sinus drainage at times, this is a recent issue.  She does have a history of allergies.  She did have a sinus infection back in May 2022.  She has not been able to go to work since 14 June 2021  REVIEW OF SYSTEMS: Out of a complete 14 system review of symptoms, the patient complains only of the following symptoms, and all other reviewed systems are negative.  Headache   ALLERGIES: Allergies  Allergen Reactions   Pollen Extract Itching  HOME MEDICATIONS: Outpatient Medications Prior to Visit  Medication Sig Dispense Refill   busPIRone  (BUSPAR ) 15 MG tablet Take 1 tablet (15 mg total) by mouth 3 (three) times daily as needed. 30 tablet 1   chlorpheniramine-HYDROcodone (TUSSIONEX) 10-8 MG/5ML Take 5 mLs by mouth at bedtime as needed. 115 mL 0   diclofenac  (CATAFLAM ) 50 MG tablet Take 1 tablet (50 mg total) by mouth 3 (three) times daily as needed (for migraine headache). 30 tablet 11   fluticasone  (FLONASE ) 50 MCG/ACT nasal spray  Place 2 sprays into both nostrils daily. 16 g 2   Fremanezumab -vfrm (AJOVY ) 225 MG/1.5ML SOSY Inject 225 mg into the skin every 30 (thirty) days. 1.5 mL 5   frovatriptan  (FROVA ) 2.5 MG tablet Take 1 tablet (2.5 mg total) by mouth as needed for migraine. If recurs, may repeat after 2 hours. Max of 2 tabs in 24 hours. 10 tablet 5   levocetirizine (XYZAL ) 5 MG tablet Take 1 tablet (5 mg total) by mouth every evening. 90 tablet 3   norethindrone  (AYGESTIN ) 5 MG tablet TAKE 1 TABLET (5 MG TOTAL) BY MOUTH DAILY. 90 tablet 0   topiramate  (TOPAMAX ) 50 MG tablet Take 3 tablets (150 mg total) by mouth at bedtime. 270 tablet 4   Vitamin D , Ergocalciferol , (DRISDOL ) 1.25 MG (50000 UNIT) CAPS capsule Take 1 capsule (50,000 Units total) by mouth every 7 (seven) days. 12 capsule 0   No facility-administered medications prior to visit.    PAST MEDICAL HISTORY: Past Medical History:  Diagnosis Date   Abnormal Pap smear of cervix    Allergy    Anxiety    Common migraine with intractable migraine 03/16/2018   Depression    Gonorrhea    Headache(784.0)    Infection    UTI   Shoulder dystocia, delivered, current hospitalization 06/14/2014   Vaginal Pap smear, abnormal    f/u ok    PAST SURGICAL HISTORY: Past Surgical History:  Procedure Laterality Date   CESAREAN SECTION N/A    [employer] 06/06/2020   CESAREAN SECTION WITH BILATERAL TUBAL LIGATION Bilateral 07/20/2016   Procedure: REPEAT CESAREAN SECTION WITH BILATERAL TUBAL LIGATION;  Surgeon: Ovid All, MD;  Location: [facility];  Service: Obstetrics;  Laterality: Bilateral;  Provider requests RNFA -ap    FAMILY HISTORY: Family History  Problem Relation Age of Onset   Diabetes Mother    Hearing loss Neg Hx     SOCIAL HISTORY: Social History   Socioeconomic History   Marital status: Married    Spouse name: Not on file   Number of children: 3   Years of education: Not on file   Highest education level: Bachelor's degree  (e.g., BA, AB, BS)  Occupational History    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  Tobacco Use   Smoking status: Every Day    Types: E-cigarettes   Smokeless tobacco: Never   Tobacco comments:    vape  Vaping Use   Vaping status: Never Used  Substance and Sexual Activity   Alcohol use: Yes    Comment: occasionally   Drug use: Yes    Types: Marijuana    Comment: occasionally   Sexual activity: Yes    Partners: Male    Birth control/protection: Surgical    Comment: btl  Other Topics Concern   Not on file  Social History Narrative   Not on file   Social Drivers of Health   Financial Resource Strain: Low Risk  (03/14/2024)   Overall Physicist, medical Strain (  CARDIA)    Difficulty of Paying Living Expenses: Not hard at all  Food Insecurity: No Food Insecurity (03/14/2024)   Hunger Vital Sign    Worried About Running Out of Food in the Last Year: Never true    Ran Out of Food in the Last Year: Never true  Recent Concern: Food Insecurity - Food Insecurity Present (01/30/2024)   Hunger Vital Sign    Worried About Running Out of Food in the Last Year: Sometimes true    Ran Out of Food in the Last Year: Never true  Transportation Needs: No Transportation Needs (03/14/2024)   PRAPARE - Administrator, Civil Service (Medical): No    Lack of Transportation (Non-Medical): No  Physical Activity: Insufficiently Active (03/14/2024)   Exercise Vital Sign    Days of Exercise per Week: 2 days    Minutes of Exercise per Session: 20 min  Stress: Stress Concern Present (03/14/2024)   [employer] of Occupational Health - Occupational Stress Questionnaire    Feeling of Stress: Very much  Social Connections: Socially Isolated (03/14/2024)   Social Connection and Isolation Panel    Frequency of Communication with Friends and Family: Once a week    Frequency of Social Gatherings with Friends and Family: Once a week    Attends Religious Services: Never    Database administrator  or Organizations: No    Attends Engineer, structural: Not on file    Marital Status: Married  Catering manager Violence: Not on file   PHYSICAL EXAM  There were no vitals filed for this visit.  There is no height or weight on file to calculate BMI.  Virtual visit  DIAGNOSTIC DATA (LABS, IMAGING, TESTING) - I reviewed patient records, labs, notes, testing and imaging myself where available.  Lab Results  Component Value Date   WBC 5.7 02/28/2024   HGB 14.0 02/28/2024   HCT 41.4 02/28/2024   MCV 88.2 02/28/2024   PLT 232.0 02/28/2024      Component Value Date/Time   NA 140 02/28/2024 1512   NA 141 10/14/2020 0957   K 3.6 02/28/2024 1512   CL 110 02/28/2024 1512   CO2 22 02/28/2024 1512   GLUCOSE 155 (H) 02/28/2024 1512   GLUCOSE 65 (L) 04/10/2014 1556   BUN 11 02/28/2024 1512   BUN 11 10/14/2020 0957   CREATININE 0.98 02/28/2024 1512   CALCIUM 9.8 02/28/2024 1512   PROT 8.0 02/28/2024 1512   PROT 7.5 10/14/2020 0957   ALBUMIN 4.8 02/28/2024 1512   ALBUMIN 4.7 10/14/2020 0957   AST 18 02/28/2024 1512   ALT 15 02/28/2024 1512   ALKPHOS 39 02/28/2024 1512   BILITOT 0.6 02/28/2024 1512   BILITOT 0.4 10/14/2020 0957   GFRNONAA >60 12/28/2022 1949   GFRAA 103 10/14/2020 0957   Lab Results  Component Value Date   CHOL 178 10/14/2020   HDL 40 10/14/2020   LDLCALC 127 (H) 10/14/2020   TRIG 58 10/14/2020   CHOLHDL 4.5 (H) 10/14/2020   Lab Results  Component Value Date   HGBA1C 5.1 02/28/2024   Lab Results  Component Value Date   VITAMINB12 374 04/03/2024   Lab Results  Component Value Date   TSH 1.63 02/28/2024   ASSESSMENT AND PLAN 38 y.o. year old female  has a past medical history of Abnormal Pap smear of cervix, Allergy, Anxiety, Common migraine with intractable migraine (03/16/2018), Depression, Gonorrhea, Headache(784.0), Infection, Shoulder dystocia, delivered, current hospitalization (06/14/2014), and Vaginal Pap  smear, abnormal. here  with:  Chronic migraine headache  - Restart Ajovy  225 mg monthly injection for migraine prevention - Continue Frova  as needed for acute migraine - Check to see if HST ordered is still valid from Dr. Buck  - Previously tried and failed: Topamax  (foggy), nortriptyline , Imitrex  - Follow-up with me in 6 months virtually  Lauraine Born, AGNP-C, DNP 06/19/2024, 3:46 PM [facility] [address], [address] [place], [place] [address] [phone]

## 2024-06-19 NOTE — Patient Instructions (Signed)
 Great to see you today! Restart Ajovy  for migraine prevention Use Frova  as needed I will check on the status of your home sleep study Call for increase in headache, follow-up with me in 6 months.  Thanks!!

## 2024-07-02 ENCOUNTER — Ambulatory Visit (INDEPENDENT_AMBULATORY_CARE_PROVIDER_SITE_OTHER): Admitting: Licensed Clinical Social Worker

## 2024-07-02 DIAGNOSIS — F411 Generalized anxiety disorder: Secondary | ICD-10-CM | POA: Diagnosis not present

## 2024-07-02 DIAGNOSIS — F331 Major depressive disorder, recurrent, moderate: Secondary | ICD-10-CM

## 2024-07-02 NOTE — Progress Notes (Signed)
 [facility] Counselor/Therapist Progress Note  Patient ID: [name] [name], MRN: [id number]    Date: 07/02/24  Time Spent: 2:02  pm - 3:07 pm : 65 Minutes  Treatment Type: Individual Therapy.  Reported Symptoms: tired and disappointed  Mental Status Exam: Appearance:  Casual     Behavior: Appropriate and Sharing  Motor: Normal  Speech/Language:  Normal Rate  Affect: Appropriate  Mood: normal  Thought process: normal  Thought content:   WNL  Sensory/Perceptual disturbances:   WNL  Orientation: oriented to person, place, and time/date  Attention: Good  Concentration: Good  Memory: WNL  Fund of knowledge:  Good  Insight:   Good  Judgment:  Good  Impulse Control: Good   Risk Assessment: Danger to Self:  No Self-injurious Behavior: No Danger to Others: No Duty to Warn:no Physical Aggression / Violence:No  Access to Firearms a concern: No  Gang Involvement:No   Subjective:   [name] [name] participated from car, via video, and consented to treatment. I discussed the limitations of evaluation and management by telemedicine and the availability of in person appointments. The patient expressed understanding and agreed to proceed.  Therapist participated from home office.  [name] [name] reviewed the events of the past week.      Interventions: Solution-Oriented/Positive Psychology and Insight-Oriented  Diagnosis:  Generalized anxiety disorder  Psychiatric Treatment: No , N/A  Treatment Plan:  Client Abilities/Strengths [name] [name] is open to sessions.    Support System: Family and Friends   Client Treatment Preferences Hybrid  Client Statement of Needs [name] [name] would like to continue services and create a new goal to track next plan of care.     Treatment Level Weekly  Symptoms  Frustrated/Angry   (Status: maintained) Tired   (Status: maintained)  Goals:   [name] [name] Patient returned after a one-month gap due to scheduling issues. She  provided updates on recent events that have been emotionally challenging. We revisited ongoing concerns regarding her [place] mother and extended family making negative racial comments about her marriage to a Black man and her biracial children. Historically, she has limited her children's contact with her mother due to these issues but has recently allowed visits to encourage a relationship, as the children are currently unaware of past comments.  Her mother has offered to pay for a family trip to [place] in February 2026. Patient is conflicted, feeling the need to process unresolved pain tied to racial bias and lack of acceptance from her mother and extended family. A language barrier makes direct conversations and emotional confrontation difficult. Therapy focused on exploring ways for the patient to safeguard her emotional well-being and protect her children during the upcoming trip, especially as they are not fully familiar with [place] culture. This immediate concern did not allow time to create new therapy goal which will be done next Monday during session.     Target Date: 09/02/24 Frequency: Weekly  Progress: 0 Modality: individual    Therapist will provide referrals for additional resources as appropriate.  Therapist will provide psycho-education regarding [name] [name]'s diagnosis and corresponding treatment approaches and interventions. Licensed Clinical Mental Health Counselor, Tawni Louder, [facility] will support the patient's ability to achieve the goals identified. will employ CBT, BA, Problem-solving, Solution Focused, Mindfulness,  coping skills, & other evidenced-based practices will be used to promote progress towards healthy functioning to help manage decrease symptoms associated with her diagnosis.   Reduce overall level, frequency, and intensity of the feelings of depression, anxiety and panic  evidenced by decreased mental burnout and fatigue. Verbally express understanding of the  relationship between feelings of depression, anxiety and their impact on thinking patterns and behaviors. Verbalize an understanding of the role that distorted thinking plays in creating fears, excessive worry, and ruminations.  [name] [name] participated in the creation of the treatment plan)   Cy Bresee, LCMHC

## 2024-07-08 ENCOUNTER — Ambulatory Visit: Admitting: Licensed Clinical Social Worker

## 2024-07-08 DIAGNOSIS — F331 Major depressive disorder, recurrent, moderate: Secondary | ICD-10-CM | POA: Diagnosis not present

## 2024-07-08 DIAGNOSIS — F411 Generalized anxiety disorder: Secondary | ICD-10-CM

## 2024-07-08 NOTE — Progress Notes (Signed)
 [facility] Counselor/Therapist Progress Note  Patient ID: Symphoni Helbling, MRN: [id number]    Date: 07/08/24  Time Spent: 12:04  pm - 1:00 pm : 56 Minutes  Treatment Type: Individual Therapy.  Reported Symptoms: hopeless, sad, unable to note pleasure or positive future, scared  Mental Status Exam: Appearance:  Casual     Behavior: Appropriate  Motor: Normal  Speech/Language:  Normal Rate  Affect: Constricted  Mood: anxious and sad  Thought process: blocked  Thought content:   WNL  Sensory/Perceptual disturbances:   WNL  Orientation: oriented to person, place, and time/date  Attention: Fair  Concentration: Fair  Memory: WNL  Fund of knowledge:  Fair  Insight:   Fair  Judgment:  Fair  Impulse Control: Good   Risk Assessment: Danger to Self:  No Self-injurious Behavior: No Danger to Others: No Duty to Warn:no Physical Aggression / Violence:No  Access to Firearms a concern: No  Gang Involvement:No   Subjective:   [name] participated from car, via video, and consented to treatment. I discussed the limitations of evaluation and management by telemedicine and the availability of in person appointments. The patient expressed understanding and agreed to proceed.  Therapist participated from home office.  [name] reviewed the events of the past week. Presented in a highly distressed state; appeared overwhelmed from the start of the session. Shared she is facing imminent eviction after learning her husband, despite previous assurances, has not paid rent for two months. He earns a Architect, but she is unaware of where the funds are going and suspects continued gambling, which has been an ongoing issue.  Patient expressed feeling avoidant and passive in confronting him, acknowledging a pattern of emotional self-abandonment and lack of assertiveness. She carries the burden of managing other household bills despite earning significantly less. The  financial crisis is further complicated by her husband's aunt (lease co-signer), who has now involved the extended family to help cover back rent.  She anticipates her husband will attempt to avoid accountability. Patient recognizes that her lack of boundaries is jeopardizing her own and her children's well-being. When prompted to name one point of gratitude, she was unable to do so after extended reflection, indicating emotional numbing and possible depressive symptoms.     Interventions: Assertiveness/Communication and Solution-Oriented/Positive Psychology  Diagnosis:  GAD MDD Moderate active   Psychiatric Treatment: No , N/A  Treatment Plan:  Client Abilities/Strengths Harlo Jaso is open to sessions.  No SI/HI expressed, but presents with hopeless tone, Insight present but overwhelmed, Engaged and receptive, though slow to respond  Support System: Family and Friends  Client Treatment Preferences Hybrid  Client Statement of Needs [name] would like to make healthy choices about her relationship-marriage.     Treatment Level Weekly  Symptoms  Flat/depressed   (Status: declined) hopeless   (Status: declined)  Goals:   Onita Darra Regressive emotional state due to chronic relational and financial stress, Poor boundaries and emotional self-neglect, Possible depressive symptoms; low mood, flat affect, anhedonia-unable to find pleasure, High level of emotional and financial dependency on spouse, Motivated for therapy but significantly hindered by home environment. Support patient through upcoming meeting with husband's aunt; encouraged honesty and transparency. Begin boundary work and assertiveness training in next session to create new goal.     Target Date: 09/16/24 Frequency: Weekly  Progress: 0 Modality: individual    Therapist will provide referrals for additional resources as appropriate.  Therapist will provide psycho-education regarding [name]'s diagnosis and  corresponding treatment approaches and interventions. Licensed Clinical Mental Health Counselor, Tawni Louder, [facility] will support the patient's ability to achieve the goals identified. will employ CBT, BA, Problem-solving, Solution Focused, Mindfulness,  coping skills, & other evidenced-based practices will be used to promote progress towards healthy functioning to help manage decrease symptoms associated with her diagnosis.   Reduce overall level, frequency, and intensity of the feelings of depression, anxiety and panic evidenced by decreased  from 6 to 7 days/week to 0 to 1 days/week per client report for at least 3 consecutive months. Verbally express understanding of the relationship between feelings of depression, anxiety and their impact on thinking patterns and behaviors. Verbalize an understanding of the role that distorted thinking plays in creating fears, excessive worry, and ruminations.  [name] participated in the creation of the treatment plan)   Laasya Peyton, LCMHC

## 2024-07-09 ENCOUNTER — Ambulatory Visit: Admitting: Obstetrics and Gynecology

## 2024-07-15 ENCOUNTER — Ambulatory Visit: Admitting: Licensed Clinical Social Worker

## 2024-07-15 ENCOUNTER — Encounter: Payer: Self-pay | Admitting: Licensed Clinical Social Worker

## 2024-07-15 DIAGNOSIS — F411 Generalized anxiety disorder: Secondary | ICD-10-CM

## 2024-07-15 DIAGNOSIS — F331 Major depressive disorder, recurrent, moderate: Secondary | ICD-10-CM

## 2024-07-15 NOTE — Progress Notes (Signed)
 [facility] Counselor/Therapist Progress Note  Patient ID: Samayra Hebel, MRN: [id number]    Date: 07/15/24  Time Spent: 9:03  am - 10:03 am : 60 Minutes  Treatment Type: Individual Therapy.  Reported Symptoms: overall improved in mood  Mental Status Exam: Appearance:  Well Groomed     Behavior: Appropriate and Sharing  Motor: Normal  Speech/Language:  Normal Rate  Affect: Appropriate  Mood: normal  Thought process: normal  Thought content:   WNL  Sensory/Perceptual disturbances:   WNL  Orientation: oriented to person, place, and time/date  Attention: Good  Concentration: Good  Memory: WNL  Fund of knowledge:  Good  Insight:   Good  Judgment:  Good  Impulse Control: Good   Risk Assessment: Danger to Self:  No Self-injurious Behavior: No Danger to Others: No Duty to Warn:no Physical Aggression / Violence:No  Access to Firearms a concern: No  Gang Involvement:No   Subjective:   [name] participated from car, via video, and consented to treatment. I discussed the limitations of evaluation and management by telemedicine and the availability of in person appointments. The patient expressed understanding and agreed to proceed.  Therapist participated from home office.  [name] reviewed the events of the past week. reported a noticeable positive shift in mood and motivation since the last session. She shared that she had a joyful weekend spent with friends, which gave her a needed emotional boost. Patient described this experience as "momentum-building," stating it felt like "an infection of joy" that carried her through the weekend and gave her a sense of hope. She noted a shift in her self-talk and energy, saying she feels more driven and ready to take action toward personal growth and change.  She also disclosed a recent conversation with her husband's aunt and uncle (cosigners on their apartment lease) regarding their overdue rent. The aunt expressed  concern about the impact on her credit and urged the couple to get their finances in order. She stated she felt the urgency and was ready to take control of her financial situation and begin focusing on her own independence.     Interventions: Cognitive Behavioral Therapy, Assertiveness/Communication, and Insight-Oriented  Diagnosis:  Generalized anxiety disorder Major Depressive Disorder moderate  Psychiatric Treatment: No , N/A  Treatment Plan:  Client Abilities/Strengths Larkyn Greenberger is open to sessions.  Presented with brighter affect and increased energy compared to previous sessions. She appeared more engaged, used self-affirming and future-focused language, and demonstrated insight into her behavioral patterns. No signs of acute distress or thought disorganization noted.  Support System: Family and Friends   Client Treatment Preferences Hybrid  Client Statement of Needs [name] would like to make healthy choices about her relationship-marriage.    Treatment Level Weekly  Symptoms  Feeling like a burden   (Status: maintained) Inspired and motivated to make changes   (Status: improved)  Goals:   Deysi Soldo is showing early signs of improved mood, motivation, and insight. Positive engagement in social activities over the weekend appears to have provided emotional momentum. Her shift in perspective and openness to behavioral change indicate readiness for goal implementation. Financial stress remains present but is being actively addressed with increased assertiveness and responsibility. Assigned homework: daily self-check-in journal and affirmation practice. Next session will review journal entries and continue building positive self-narrative and boundaries.   Target Date: 09/16/24 Frequency: Weekly  Progress: 0 Modality: individual    Therapist will provide referrals for additional resources as appropriate.  Therapist  will provide psycho-education regarding [name]'s  diagnosis and corresponding treatment approaches and interventions. Licensed Clinical Mental Health Counselor, Tawni Louder, [facility] will support the patient's ability to achieve the goals identified. will employ CBT, BA, Problem-solving, Solution Focused, Mindfulness,  coping skills, & other evidenced-based practices will be used to promote progress towards healthy functioning to help manage decrease symptoms associated with her diagnosis.   Reduce overall level, frequency, and intensity of the feelings of depression, anxiety and panic evidenced by decreased self abandonment. Verbally express understanding of the relationship between feelings of depression, anxiety and their impact on thinking patterns and behaviors. Verbalize an understanding of the role that distorted thinking plays in creating fears, excessive worry, and ruminations.  [name] participated in the creation of the treatment plan)   Eleyna Brugh, LCMHC

## 2024-07-23 ENCOUNTER — Ambulatory Visit (INDEPENDENT_AMBULATORY_CARE_PROVIDER_SITE_OTHER): Admitting: Licensed Clinical Social Worker

## 2024-07-23 DIAGNOSIS — F411 Generalized anxiety disorder: Secondary | ICD-10-CM

## 2024-07-23 DIAGNOSIS — F331 Major depressive disorder, recurrent, moderate: Secondary | ICD-10-CM

## 2024-07-23 DIAGNOSIS — F329 Major depressive disorder, single episode, unspecified: Secondary | ICD-10-CM | POA: Diagnosis not present

## 2024-07-23 NOTE — Progress Notes (Signed)
 [facility] Counselor/Therapist Progress Note  Patient ID: [name] [name], MRN: [id number]    Date: 07/23/24  Time Spent: 2:02  pm - 3:03 pm : 61 Minutes  Treatment Type: Individual Therapy.  Reported Symptoms: excited, proud, happy   Mental Status Exam: Appearance:  Well Groomed     Behavior: Appropriate  Motor: Normal  Speech/Language:  Normal Rate  Affect: Appropriate  Mood: normal  Thought process: normal  Thought content:   WNL  Sensory/Perceptual disturbances:   WNL  Orientation: oriented to person, place, and time/date  Attention: Good  Concentration: Good  Memory: WNL  Fund of knowledge:  Good  Insight:   Good  Judgment:  Good  Impulse Control: Good   Risk Assessment: Danger to Self:  No Self-injurious Behavior: No Danger to Others: No Duty to Warn:no Physical Aggression / Violence:No  Access to Firearms a concern: No  Gang Involvement:No   Subjective:   [name] [name] participated from home, via video, and consented to treatment. I discussed the limitations of evaluation and management by telemedicine and the availability of in person appointments. The patient expressed understanding and agreed to proceed.  Therapist participated from home office.  [name] [name] reviewed the events of the past week. Patient shared positive updates: her son made quarterback and scored two touchdowns, and her daughter had a successful birthday party, which brought her joy. She is currently planning her stepson's 16th birthday with his mother, despite behavioral and academic challenges and a history of low peer attendance at his events.     Interventions: Cognitive Behavioral Therapy and Insight-Oriented  Diagnosis:  MDD & GAD  Psychiatric Treatment: No , N/A  Treatment Plan:  Client Abilities/Strengths [name] [name] is open to sessions.  Patient was reflective and emotionally expressive. Reports tension with stepson due to oppositional behavior and a recent  conflict where he challenged her authority. She acknowledged engaging in petty, retaliatory behavior and recognized it as unhelpful.  Support System: Family and Friends   Client Treatment Preferences Hybrid  Client Statement of Needs [name] [name] would like to like to make healthy choices about her relationship-marriage.     Treatment Level Weekly  Symptoms  Proud   (Status: improved) Happy   (Status: improved)  Goals:   [name] [name] Patient shows insight into power struggles and the importance of maintaining parental authority with consistency and assertiveness. Fear of lack of support from spouse was noted, but she felt validated when her husband supported her stance. Continue to build parenting confidence and assertive communication skills. Reinforce strategies for emotional regulation and healthy discipline. Follow up on family dynamics and stepson's behavior next session.   Target Date: 09/16/24 Frequency: Weekly  Progress: 0 Modality: individual    Therapist will provide referrals for additional resources as appropriate.  Therapist will provide psycho-education regarding [name] [name]'s diagnosis and corresponding treatment approaches and interventions. Licensed Clinical Mental Health Counselor, Tawni Louder, [name] will support the patient's ability to achieve the goals identified. will employ CBT, BA, Problem-solving, Solution Focused, Mindfulness,  coping skills, & other evidenced-based practices will be used to promote progress towards healthy functioning to help manage decrease symptoms associated with her diagnosis.   Reduce overall level, frequency, and intensity of the feelings of depression, anxiety and panic evidenced by decreased feelings of doom from 6 to 7 days/week to 0 to 1 days/week per client report for at least 3 consecutive months. Verbally express understanding of the relationship between feelings of depression, anxiety and their impact on  thinking patterns and  behaviors. Verbalize an understanding of the role that distorted thinking plays in creating fears, excessive worry, and ruminations.  [name] [name] participated in the creation of the treatment plan)   Wendal Wilkie, LCMHC

## 2024-07-29 ENCOUNTER — Ambulatory Visit: Admitting: Obstetrics and Gynecology

## 2024-07-29 ENCOUNTER — Encounter: Payer: Self-pay | Admitting: Obstetrics and Gynecology

## 2024-07-31 ENCOUNTER — Ambulatory Visit: Admitting: Licensed Clinical Social Worker

## 2024-07-31 DIAGNOSIS — F329 Major depressive disorder, single episode, unspecified: Secondary | ICD-10-CM | POA: Diagnosis not present

## 2024-07-31 DIAGNOSIS — F331 Major depressive disorder, recurrent, moderate: Secondary | ICD-10-CM

## 2024-07-31 DIAGNOSIS — F411 Generalized anxiety disorder: Secondary | ICD-10-CM

## 2024-07-31 NOTE — Progress Notes (Signed)
 [facility] Counselor/Therapist Progress Note  Patient ID: [name] [name], MRN: [id number]    Date: 07/31/24  Time Spent: 9:04  am - 10:03 am : 59 Minutes  Treatment Type: Individual Therapy.  Reported Symptoms: appears thoughtful, mildly tearful at times, but engaged and reflective. Mood: subdued; Affect: congruent. No psychotic features or safety concerns noted.  Mental Status Exam: Appearance:  Well Groomed     Behavior: Appropriate, Sharing, and Rationalizing  Motor: Normal  Speech/Language:  Normal Rate  Affect: Appropriate  Mood: normal  Thought process: normal, circumstantial   Thought content:   WNL  Sensory/Perceptual disturbances:   WNL  Orientation: oriented to person, place, and time/date  Attention: Good  Concentration: Good  Memory: WNL  Fund of knowledge:  Good  Insight:   Good  Judgment:  Good  Impulse Control: Good   Risk Assessment: Danger to Self:  No Self-injurious Behavior: No Danger to Others: No Duty to Warn:no Physical Aggression / Violence:No  Access to Firearms a concern: No  Gang Involvement:No   Subjective:   [name] participated in the session, in person in the office with the therapist, and consented to treatment. [name] reviewed the events of the past week. reports recent reflections after watching TV shows, leading to increased comparison of herself to fictional characters, friends, and her perceived life expectations. She expresses feelings of dissatisfaction with her current stage in life and marriage. Notes frustration with a friend who only contacts her during crises and recently forgot her birthday. States love for her husband but acknowledges longstanding marital challenges and thoughts about how her life might differ without him.     Interventions: Cognitive Behavioral Therapy and Insight-Oriented  Diagnosis:    MDD  Psychiatric Treatment: No , N/A  Treatment Plan:  Client Abilities/Strengths Ineze Serrao is open to sessions.  Support System: Family and Friends   Client Treatment Preferences Hybrid  Client Statement of Needs [name] would like to make healthy choices about her relationship-marriage.      Treatment Level Weekly  Symptoms  Overwhelmed   (Status: maintained) Physical health flare up   (Status: declined)  Goals: experiencing situational distress related to self-comparison, interpersonal boundaries, and emotional suppression. Insight improving. Psychoeducation provided on emotional silencing and its impact on women's health. Discussed patterns of overextending herself and not voicing needs.  Encourage self-reflection before accommodating others ("Do I want to comply or choose myself?"). Continue work on emotional expression and boundary setting. Resume therapy in 2 weeks. Monitor mood and self-care behaviors. Target Date: 09/16/24 Frequency: Weekly  Progress: 0 Modality: individual    Therapist will provide referrals for additional resources as appropriate.  Therapist will provide psycho-education regarding [name]'s diagnosis and corresponding treatment approaches and interventions. Licensed Clinical Mental Health Counselor, Tawni Louder, [facility] will support the patient's ability to achieve the goals identified. will employ CBT, BA, Problem-solving, Solution Focused, Mindfulness,  coping skills, & other evidenced-based practices will be used to promote progress towards healthy functioning to help manage decrease symptoms associated with her diagnosis.   Reduce overall level, frequency, and intensity of the feelings of depression, anxiety and panic evidenced by decreased self silencing from 6 to 7 days/week to 0 to 1 days/week per client report for at least 3 consecutive months. Verbally express understanding of the relationship between feelings of depression, anxiety and their impact on thinking patterns and behaviors. Verbalize an understanding of the role that  distorted thinking plays in creating fears, excessive worry, and  ruminations.  [name] participated in the creation of the treatment plan)  Angelyse Heslin, LCMHC

## 2024-08-13 ENCOUNTER — Ambulatory Visit: Admitting: Licensed Clinical Social Worker

## 2024-08-19 ENCOUNTER — Ambulatory Visit: Admitting: Licensed Clinical Social Worker

## 2024-08-19 DIAGNOSIS — F331 Major depressive disorder, recurrent, moderate: Secondary | ICD-10-CM

## 2024-08-19 DIAGNOSIS — F411 Generalized anxiety disorder: Secondary | ICD-10-CM

## 2024-08-19 NOTE — Progress Notes (Signed)
 [facility] Counselor/Therapist Progress Note  Patient ID: [name] [name], MRN: [id number]    Date: 08/19/24  Time Spent: 11:02  am - 12:05 pm : 63 Minutes  Treatment Type: Individual Therapy.  Reported Symptoms: Patient appeared fatigued and emotionally burdened. Speech coherent, insight fluctuating but present. Continues to describe patterns of self-silencing and emotional suppression. No imminent safety concerns reported  Mental Status Exam: Appearance:  Well Groomed     Behavior: Appropriate, Sharing, and Rationalizing  Motor: Normal  Speech/Language:  Normal Rate  Affect: Appropriate  Mood: normal  Thought process: normal  Thought content:   WNL  Sensory/Perceptual disturbances:   WNL  Orientation: oriented to person, place, and time/date  Attention: Good  Concentration: Good  Memory: WNL  Fund of knowledge:  Good  Insight:   Good  Judgment:  Good  Impulse Control: Good   Risk Assessment: Danger to Self:  No Self-injurious Behavior: No Danger to Others: No Duty to Warn:no Physical Aggression / Violence:No  Access to Firearms a concern: No  Gang Involvement:No   Subjective:   [name] [name] participated from car, via video, and consented to treatment. I discussed the limitations of evaluation and management by telemedicine and the availability of in person appointments. The patient expressed understanding and agreed to proceed.  Therapist participated from home office.  [name] [name] reviewed the events of the past week. Patient reports feeling emotionally stagnant and overwhelmed, noting ongoing depression and anxiety primarily linked to chronic communication difficulties with her husband. She describes being on "autopilot," avoiding confrontation, and struggling to assert her needs. Patient reports fluctuations between temporary improvement after sessions and heightened distress when her husband's behaviors escalate. She acknowledges avoiding therapeutic  homework aimed at addressing issues directly. Patient expresses love for her husband yet also significant frustration and confusion about the relationship's future.     Interventions: Assertiveness/Communication and Insight-Oriented  Diagnosis:  Generalized anxiety disorder  Psychiatric Treatment: No , N/A  Treatment Plan:  Client Abilities/Strengths Arlesia Kiel is open to sessions.  Support System: Family and Friends  [employer] of Needs [name] [name] would like to to make healthy choices about her relationship-marriage.      Treatment Level Weekly  Symptoms  frustrated   (Status: declined) embarrassed   (Status: declined)  Goals:   [name] [name] Patient is experiencing persistent depressive and anxious symptoms related to relational instability and ineffective communication dynamics. Ongoing cycles of avoidance, emotional protection of her partner, and difficulty holding him accountable maintain her distress. External feedback from others highlights similar patterns, reinforcing patient's internal conflict. Progress has been limited by avoidance of between-session work and sustained relational stressors. Continue supporting patient's emotional regulation, insight-building, and development of assertive communication skills. Reintroduce structured, manageable homework focused on boundary-setting and identifying personal needs. Explore barriers to accountability and avoidance patterns within the relationship. Reinforce safety planning and emotional support strategies. Follow up next scheduled session.   Target Date: 09/16/24 Frequency: Weekly  Progress: 0 Modality: individual    Therapist will provide referrals for additional resources as appropriate.  Therapist will provide psycho-education regarding [name] [name]'s diagnosis and corresponding treatment approaches and interventions. Licensed Clinical Mental Health Counselor, Tawni Louder,  [facility] will support the patient's ability to achieve the goals identified. will employ CBT, BA, Problem-solving, Solution Focused, Mindfulness,  coping skills, & other evidenced-based practices will be used to promote progress towards healthy functioning to help manage decrease symptoms associated with her diagnosis.   Reduce overall  level, frequency, and intensity of the feelings of depression, anxiety and panic evidenced by decreased doom and lack of control from 6 to 7 days/week to 0 to 1 days/week per client report for at least 3 consecutive months. Verbally express understanding of the relationship between feelings of depression, anxiety and their impact on thinking patterns and behaviors. Verbalize an understanding of the role that distorted thinking plays in creating fears, excessive worry, and ruminations.  [name] [name] participated in the creation of the treatment plan)   [name] [name], LCMHC

## 2024-08-27 ENCOUNTER — Ambulatory Visit: Admitting: Licensed Clinical Social Worker

## 2024-08-27 ENCOUNTER — Encounter: Payer: Self-pay | Admitting: Licensed Clinical Social Worker

## 2024-08-27 DIAGNOSIS — F411 Generalized anxiety disorder: Secondary | ICD-10-CM | POA: Diagnosis not present

## 2024-08-27 DIAGNOSIS — F331 Major depressive disorder, recurrent, moderate: Secondary | ICD-10-CM | POA: Diagnosis not present

## 2024-08-27 NOTE — Progress Notes (Signed)
 [facility] Counselor/Therapist Progress Note  Patient ID: Dacie Mandel, MRN: [id number]    Date: 08/27/24  Time Spent: 3:04  pm - 4:12 pm : 68 Minutes  Treatment Type: Individual Therapy.  Reported Symptoms: Patient engaged, emotionally expressive, and reflective. Mood mildly anxious but stable; affect congruent. Demonstrates insight into relational patterns. No safety concerns reported.  Mental Status Exam: Appearance:  Casual     Behavior: Appropriate and Sharing  Motor: Normal  Speech/Language:  Normal Rate  Affect: Appropriate  Mood: normal  Thought process: normal  Thought content:   WNL  Sensory/Perceptual disturbances:   WNL  Orientation: oriented to person, place, and time/date  Attention: Good  Concentration: Good  Memory: WNL  Fund of knowledge:  Good  Insight:   Good  Judgment:  Good  Impulse Control: Good   Risk Assessment: Danger to Self:  No Self-injurious Behavior: No Danger to Others: No Duty to Warn:no Physical Aggression / Violence:No  Access to Firearms a concern: No  Gang Involvement:No   Subjective:   [name] participated from home, via video, and consented to treatment. I discussed the limitations of evaluation and management by telemedicine and the availability of in person appointments. The patient expressed understanding and agreed to proceed.  Therapist participated from home office.  [name] reviewed the events of the past week. Patient reports feeling guilty after her middle son slipped on a wet kitchen floor and hit his head. She has been monitoring him at home due to not feeling well after completing the prior school day. Patient expressed distress that her phone was off due to financial mismanagement with her husband, causing her to miss her son's call from school after the fall. She continues to observe him for symptoms and notes he also injured his leg. Patient also reports interpersonal stress related to feeling  left out by her best friend and difficulty navigating adult friendships. She desires to improve communication and reduce avoidant patterns in relationships, including with her husband and children     Interventions: Solution-Oriented/Positive Psychology and Psycho-education/Bibliotherapy  Diagnosis:  Generalized anxiety disorder  Moderate episode of recurrent major depressive disorder [facility])  Psychiatric Treatment: No , N/A  Treatment Plan:  Client Abilities/Strengths [name] is open to sessions.    Support System: Family and Friends   Client Treatment Preferences Hybrid  Client Statement of Needs [name] would like to make healthy choices about her relationship-marriage an parenting.     Treatment Level Weekly  Symptoms  reflective   (Status: improved) optimistic   (Status: improved)  Goals:   [name] Patient experiencing situational guilt and stress related to parenting and family financial strain. Ongoing challenges with conflict avoidance and passive communication patterns. Responded well to psychoeducation on communication strategies, specifically the "make a link" technique, and expressed motivation to apply these skills with friends and family. Progress noted in insight and readiness for behavioral change. Continue exploring communication patterns and support patient in implementing assertive communication strategies. Reinforce non-avoidant approaches in family and social interactions. Encourage patient to monitor stress levels related to parenting responsibilities and financial concerns. Follow up next session on her use of the "make a link" technique and planned family meeting.  Target Date: 09/16/24 Frequency: Weekly  Progress: 0 Modality: individual    Therapist will provide referrals for additional resources as appropriate.  Therapist will provide psycho-education regarding [name]'s diagnosis and corresponding treatment approaches and  interventions. Licensed Clinical Mental Health Counselor, Tawni Louder, [facility] will  support the patient's ability to achieve the goals identified. will employ CBT, BA, Problem-solving, Solution Focused, Mindfulness,  coping skills, & other evidenced-based practices will be used to promote progress towards healthy functioning to help manage decrease symptoms associated with her diagnosis.   Reduce overall level, frequency, and intensity of the feelings of depression, anxiety and panic evidenced by decreased doom and lack of control  from 6 to 7 days/week to 0 to 1 days/week per client report for at least 3 consecutive months. Verbally express understanding of the relationship between feelings of depression, anxiety and their impact on thinking patterns and behaviors. Verbalize an understanding of the role that distorted thinking plays in creating fears, excessive worry, and ruminations.  [name] participated in the creation of the treatment plan)   Deleon Passe, LCMHC

## 2024-09-01 ENCOUNTER — Other Ambulatory Visit: Payer: Self-pay | Admitting: Obstetrics and Gynecology

## 2024-09-02 NOTE — Telephone Encounter (Signed)
 Med refill request: aygestin   Last AEX: 10/19/23 Next AEX: not scheduled message sent to fd  Last MMG (if hormonal med) Refill authorized: medication on hold until pt schedules aex

## 2024-09-03 ENCOUNTER — Ambulatory Visit: Admitting: Licensed Clinical Social Worker

## 2024-09-03 ENCOUNTER — Ambulatory Visit: Payer: Self-pay | Admitting: Physician Assistant

## 2024-09-03 ENCOUNTER — Ambulatory Visit: Admitting: Physician Assistant

## 2024-09-03 VITALS — BP 130/80 | HR 63 | Temp 97.4°F | Ht 61.0 in | Wt 156.0 lb

## 2024-09-03 DIAGNOSIS — R079 Chest pain, unspecified: Secondary | ICD-10-CM

## 2024-09-03 DIAGNOSIS — E559 Vitamin D deficiency, unspecified: Secondary | ICD-10-CM

## 2024-09-03 DIAGNOSIS — F320 Major depressive disorder, single episode, mild: Secondary | ICD-10-CM

## 2024-09-03 DIAGNOSIS — F411 Generalized anxiety disorder: Secondary | ICD-10-CM

## 2024-09-03 DIAGNOSIS — F331 Major depressive disorder, recurrent, moderate: Secondary | ICD-10-CM

## 2024-09-03 LAB — VITAMIN D 25 HYDROXY (VIT D DEFICIENCY, FRACTURES): VITD: 22.71 ng/mL — ABNORMAL LOW (ref 30.00–100.00)

## 2024-09-03 MED ORDER — VITAMIN D (ERGOCALCIFEROL) 1.25 MG (50000 UNIT) PO CAPS: 50000.0000 [IU] | ORAL_CAPSULE | ORAL | 0 refills | Status: AC

## 2024-09-03 NOTE — Progress Notes (Signed)
 History of Present Illness:   Chief Complaint  Patient presents with   Anxiety/Depression    Pt here for f/u, currently taking Buspar  15 mg prn, not really sure if helping.    Discussed the use of AI scribe software for clinical note transcription with the patient, who gave verbal consent to proceed.  History of Present Illness   [name] [name] is a 38 year old female who presents with chest pain.  She describes central chest pain radiating to her back that began right before Halloween, initially severe enough to bring her to her knees. The pain improved the next day but has recurred intermittently, often with stress, and is associated with palpitations. Prior evaluation with EKG, chest x-ray, and blood work was normal -- performed at the ER on 07/30/24.  She attends weekly therapy, which helps her manage stress and daily life. She missed three weeks because of scheduling and felt significantly worse during that period. She is currently back in therapy and finds it helpful.  She feels consistently tired, even when she thinks she has slept well. She notes prior low vitamin D  and is not taking supplements. She has been amenorrheic since last December and believes iron deficiency is unlikely.  She takes Buspar  but is unsure it is effective, especially during the time she missed therapy.        Past Medical History:  Diagnosis Date   Abnormal Pap smear of cervix    Allergy    Anxiety    Common migraine with intractable migraine 03/16/2018   Depression    Gonorrhea    Headache(784.0)    Infection    UTI   Shoulder dystocia, delivered, current hospitalization 06/14/2014   Vaginal Pap smear, abnormal    f/u ok     Social History   Tobacco Use   Smoking status: Every Day    Types: E-cigarettes   Smokeless tobacco: Never   Tobacco comments:    vape  Vaping Use   Vaping status: Never Used  Substance Use Topics   Alcohol use: Yes    Comment: occasionally   Drug use:  Yes    Types: Marijuana    Comment: occasionally    Past Surgical History:  Procedure Laterality Date   CESAREAN SECTION N/A    [employer] 06/06/2020   CESAREAN SECTION WITH BILATERAL TUBAL LIGATION Bilateral 07/20/2016   Procedure: REPEAT CESAREAN SECTION WITH BILATERAL TUBAL LIGATION;  Surgeon: Ovid All, MD;  Location: [facility];  Service: Obstetrics;  Laterality: Bilateral;  Provider requests RNFA -ap    Family History  Problem Relation Age of Onset   Diabetes Mother    Hearing loss Neg Hx     Allergies  Allergen Reactions   Pollen Extract Itching    Current Medications:   Current Outpatient Medications:    busPIRone  (BUSPAR ) 15 MG tablet, Take 1 tablet (15 mg total) by mouth 3 (three) times daily as needed., Disp: 30 tablet, Rfl: 1   diclofenac  (CATAFLAM ) 50 MG tablet, Take 1 tablet (50 mg total) by mouth 3 (three) times daily as needed (for migraine headache)., Disp: 30 tablet, Rfl: 11   Fremanezumab -vfrm (AJOVY ) 225 MG/1.5ML SOSY, Inject 225 mg into the skin every 30 (thirty) days., Disp: 1.5 mL, Rfl: 11   frovatriptan  (FROVA ) 2.5 MG tablet, Take 1 tablet (2.5 mg total) by mouth as needed for migraine. If recurs, may repeat after 2 hours. Max of 2 tabs in 24 hours., Disp: 10 tablet, Rfl: 11  levocetirizine (XYZAL ) 5 MG tablet, Take 1 tablet (5 mg total) by mouth every evening., Disp: 90 tablet, Rfl: 3   norethindrone  (AYGESTIN ) 5 MG tablet, TAKE 1 TABLET (5 MG TOTAL) BY MOUTH DAILY., Disp: 90 tablet, Rfl: 0   Review of Systems:   Negative unless otherwise specified per HPI.  Vitals:   Vitals:   09/03/24 1045  BP: 130/80  Pulse: 63  Temp: (!) 97.4 F (36.3 C)  TempSrc: Temporal  SpO2: 99%  Weight: 156 lb (70.8 kg)  Height: 5' 1 (1.549 m)     Body mass index is 29.48 kg/m.  Physical Exam:   Physical Exam Vitals and nursing note reviewed.  Constitutional:      General: She is not in acute distress.    Appearance: She is well-developed. She  is not ill-appearing or toxic-appearing.  Cardiovascular:     Rate and Rhythm: Normal rate and regular rhythm.     Pulses: Normal pulses.     Heart sounds: Normal heart sounds, S1 normal and S2 normal.  Pulmonary:     Effort: Pulmonary effort is normal.     Breath sounds: Normal breath sounds.  Skin:    General: Skin is warm and dry.  Neurological:     Mental Status: She is alert.     GCS: GCS eye subscore is 4. GCS verbal subscore is 5. GCS motor subscore is 6.  Psychiatric:        Speech: Speech normal.        Behavior: Behavior normal. Behavior is cooperative.     Assessment and Plan:   Assessment and Plan    Depression, major, single episode, mild and Generalized Anxiety Disorder Ongoing depression and anxiety improving with therapy. Buspirone  perceived as ineffective, but no medication change desired. - Continue weekly therapy sessions. - Reassess medication needs at year-end. - reports there is no suicidal ideation/hi   Chest Pain Intermittent chest pain and palpitations likely stress-related. Normal EKG and chest X-ray. No cardiac cause identified. - Monitor symptoms and manage stress. - Declines need for cardiology referral at this time  Vitamin D  Deficiency Previous deficiency with persistent fatigue. Vitamin D  not considered the cause. No supplementation currently. - Ordered vitamin D  level test.         Lucie Buttner, PA-C

## 2024-09-03 NOTE — Progress Notes (Signed)
 [facility] Counselor/Therapist Progress Note  Patient ID: Veanna Dower, MRN: [id number]    Date: 09/03/24  Time Spent: 2:15  pm - 3:15 pm : 60 Minutes  Treatment Type: Individual Therapy.  Reported Symptoms: Pt engaged, reflective, mildly anxious when discussing marital triggers. Mood stable; affect congruent. No SI/HI. Demonstrated insight into avoidance patterns and emotional impact on nervous system.  Mental Status Exam: Appearance:  Well Groomed     Behavior: Appropriate and Sharing  Motor: Normal  Speech/Language:  Normal Rate  Affect: Appropriate  Mood: normal  Thought process: normal  Thought content:   WNL  Sensory/Perceptual disturbances:   WNL  Orientation: oriented to person, place, and time/date  Attention: Good  Concentration: Good  Memory: WNL  Fund of knowledge:  Good  Insight:   Good  Judgment:  Good  Impulse Control: Good   Risk Assessment: Danger to Self:  No Self-injurious Behavior: No Danger to Others: No Duty to Warn:no Physical Aggression / Violence:No  Access to Firearms a concern: No  Gang Involvement:No   Subjective:   [name] participated from home, via video, and consented to treatment. I discussed the limitations of evaluation and management by telemedicine and the availability of in person appointments. The patient expressed understanding and agreed to proceed.  Therapist participated from home office.  [name] reviewed the events of the past week. Pt reports updates from Thanksgiving trip to [place] to visit husband's family. Husband attended 20-year class reunion; pt was triggered when he left suddenly without saying goodbye, leaving their belongings in the car and returning at 3 AM. This resurfaced memories of his reunion 10 years ago, when he disappeared and she suspected infidelity. She saw online photos showing the same woman present this year, which increased her anxiety, but she asked her husband about it calmly; he  denied cheating, and she is choosing to believe him. Pt notes she avoids difficult conversations out of fear of what she may learn. She reports chronic stress in the marriage related to husband's financial irresponsibility and gambling. Despite this, she wants to stay in the relationship and describes the rest of the holiday weekend as positive.     Interventions: Narrative  Diagnosis:  Generalized anxiety disorder   Moderate episode of recurrent major depressive disorder [facility])  Psychiatric Treatment: No , N/A  Treatment Plan:  Client Abilities/Strengths Lynnlee Revels is open to sessions.    Support System: Family and Friends   Client Treatment Preferences Hybrid  Client Statement of Needs [name] would like to make healthy choices about her relationship-marriage an parenting.        Treatment Level Weekly  Symptoms  nostalgic   (Status: improved) Proud/accomplished   (Status: improved)  Goals:   Onita Darra Marital stressors and unresolved trust concerns continue to impact pt's anxiety. Pt shows progress in communication--appropriately confronted husband calmly without escalation. Ongoing avoidance of difficult topics (financial issues, relationship expectations) contributing to emotional limbo and physiological stress. Continue exploring communication patterns and increasing assertiveness skills. Pt to reflect before next session on how she wants to address the state of the marriage and move from coexistence to partnership. Support pt in preparing for a structured, calm conversation with husband regarding expectations and boundaries. Next session in one week.   Target Date: 09/16/24 Frequency: Weekly  Progress: 0 Modality: individual    Therapist will provide referrals for additional resources as appropriate.  Therapist will provide psycho-education regarding Helayne Metsker diagnosis and corresponding treatment approaches and  interventions. Licensed Clinical Mental Health  Counselor, Tawni Louder, [facility] will support the patient's ability to achieve the goals identified. will employ CBT, BA, Problem-solving, Solution Focused, Mindfulness,  coping skills, & other evidenced-based practices will be used to promote progress towards healthy functioning to help manage decrease symptoms associated with her diagnosis.   Reduce overall level, frequency, and intensity of the feelings of depression, anxiety and panic evidenced by decreased self abandonment and fear to confront from 6 to 7 days/week to 0 to 1 days/week per client report for at least 3 consecutive months. Verbally express understanding of the relationship between feelings of depression, anxiety and their impact on thinking patterns and behaviors. Verbalize an understanding of the role that distorted thinking plays in creating fears, excessive worry, and ruminations.  [name] participated in the creation of the treatment plan)   Antonella Upson, LCMHC

## 2024-09-05 NOTE — Telephone Encounter (Signed)
 RX Refused, pt needs appt. Attempted to contact pt to schedule with no response. Routing to provider for review

## 2024-09-09 ENCOUNTER — Encounter: Payer: Self-pay | Admitting: Physician Assistant

## 2024-09-09 ENCOUNTER — Telehealth: Admitting: Physician Assistant

## 2024-09-09 ENCOUNTER — Ambulatory Visit: Admitting: Licensed Clinical Social Worker

## 2024-09-09 ENCOUNTER — Encounter: Payer: Self-pay | Admitting: Licensed Clinical Social Worker

## 2024-09-09 VITALS — Wt 153.0 lb

## 2024-09-09 DIAGNOSIS — F411 Generalized anxiety disorder: Secondary | ICD-10-CM

## 2024-09-09 DIAGNOSIS — F320 Major depressive disorder, single episode, mild: Secondary | ICD-10-CM

## 2024-09-09 DIAGNOSIS — F331 Major depressive disorder, recurrent, moderate: Secondary | ICD-10-CM

## 2024-09-09 DIAGNOSIS — E559 Vitamin D deficiency, unspecified: Secondary | ICD-10-CM

## 2024-09-09 DIAGNOSIS — R635 Abnormal weight gain: Secondary | ICD-10-CM

## 2024-09-09 DIAGNOSIS — E663 Overweight: Secondary | ICD-10-CM

## 2024-09-09 DIAGNOSIS — R079 Chest pain, unspecified: Secondary | ICD-10-CM

## 2024-09-09 MED ORDER — ESCITALOPRAM OXALATE 10 MG PO TABS: 10.0000 mg | ORAL_TABLET | Freq: Every day | ORAL | 1 refills | Status: DC

## 2024-09-09 NOTE — Progress Notes (Signed)
 [facility] Counselor/Therapist Progress Note  Patient ID: [name] [name], MRN: [id number]    Date: 09/09/24  Time Spent: 10:02  am - 11:03 am : 61 Minutes  Treatment Type: Individual Therapy.  Reported Symptoms: Pt visibly and audibly fatigued; affect depressed and subdued. Tearful at times. No SI/HI reported. Insight intact. Thought process logical but slowed due to exhaustion.  Mental Status Exam: Appearance:  Casual     Behavior: Appropriate and Sharing  Motor: Normal  Speech/Language:  Normal Rate  Affect: Appropriate  Mood: normal  Thought process: normal  Thought content:   WNL  Sensory/Perceptual disturbances:   WNL  Orientation: oriented to person, place, and time/date  Attention: Good  Concentration: Good  Memory: WNL  Fund of knowledge:  Good  Insight:   Good  Judgment:  Good  Impulse Control: Good   Risk Assessment: Danger to Self:  No Self-injurious Behavior: No Danger to Others: No Duty to Warn:no Physical Aggression / Violence:No  Access to Firearms a concern: No  Gang Involvement:No   Subjective:   [name] [name] participated from home, via video, and consented to treatment. I discussed the limitations of evaluation and management by telemedicine and the availability of in person appointments. The patient expressed understanding and agreed to proceed.  Therapist participated from home office.  [name] [name] reviewed the events of the past week. Pt reports significant fatigue, overwhelm, and emotional distress following another conflict with stepson who continues to disregard rules and her parental authority. States she is "locked and frozen" when it comes to confronting her husband, despite having the ability to communicate effectively with others. Pt disclosed for the first time directly that she has been considering taking her two children and leaving, noting ongoing marital dysfunction. Pt agrees she must make an intentional, well-planned  decision rather than reacting from emotional exhaustion. Homework assigned to journal the impact of household discord on each family member.     Interventions: Cognitive Behavioral Therapy and Insight-Oriented  Diagnosis:  GAD MDD Moderate  Psychiatric Treatment: No , N/A  Treatment Plan:  Client Abilities/Strengths [name] [name] is open to sessions.    Support System: Family and Friends  Client Treatment Preferences Hybrid  Client Statement of Needs [name] [name] would like to  make healthy choices about her relationship-marriage an parenting.      Treatment Level Weekly  Symptoms  fatigued   (Status: declined) overwhelmed   (Status: declined)  Goals:   [name] Darra Increased stress, emotional burnout, and depressive symptoms linked to marital conflict, parenting strain, and household instability. Pt experiencing high physiological stress; reports low vitamin D  and concern for additional deficiencies. Pt demonstrates motivation for change but remains avoidant of direct confrontation with spouse. Requires continued weekly support. Continue weekly therapy for emotional regulation, boundary-setting, and decision-making support. Pt to journal household impact and reflect on intentional next steps before confronting spouse. Discussed mind-body connection; reviewed how stress and depression affect physical health. Pt to follow up with PCP for full lab panel (including B12) given low vitamin D  and ongoing fatigue. Explore mindful eating, potential RD referral, and medication adjustment with PCP if needed. Reinforced that needing weekly therapy reflects strength and active recovery, not weakness.   Target Date: 09/16/24 Frequency: Weekly  Progress: 0 Modality: individual    Therapist will provide referrals for additional resources as appropriate.  Therapist will provide psycho-education regarding [name] [name]'s diagnosis and corresponding treatment approaches and interventions. Licensed  Clinical Mental Health Counselor, Tawni Louder, [facility] will support  the patient's ability to achieve the goals identified. will employ CBT, BA, Problem-solving, Solution Focused, Mindfulness,  coping skills, & other evidenced-based practices will be used to promote progress towards healthy functioning to help manage decrease symptoms associated with her diagnosis.   Reduce overall level, frequency, and intensity of the feelings of depression, anxiety and panic evidenced by decreased self abandonment from 6 to 7 days/week to 0 to 1 days/week per client report for at least 3 consecutive months. Verbally express understanding of the relationship between feelings of depression, anxiety and their impact on thinking patterns and behaviors. Verbalize an understanding of the role that distorted thinking plays in creating fears, excessive worry, and ruminations.  [name] [name] participated in the creation of the treatment plan)   [name] [name], LCMHC

## 2024-09-09 NOTE — Progress Notes (Signed)
 Virtual Visit via Video Note   I, Lucie Buttner, connected with  [name] [name]  ([id number], 13-Feb-1986) on 09/09/24 at 11:20 AM EST by a video-enabled telemedicine application and verified that I am speaking with the correct person using two identifiers.  Location: Patient: Home Provider: [facility] [place] office   I discussed the limitations of evaluation and management by telemedicine and the availability of in person appointments. The patient expressed understanding and agreed to proceed.    Discussed the use of AI scribe software for clinical note transcription with the patient, who gave verbal consent to proceed.  History of Present Illness   [name] [name] is a 38 year old female who presents with recurrent chest pain and stress-related symptoms.  She describes recurrent stress-related chest pain, most recently after an argument with her stepson. The pain is similar to prior episodes that prompted an ER visit where EKG was normal. It lasted about 30 minutes and can be associated with difficulty breathing.  She has increased anxiety and uses buspirone  15 mg three times daily as needed, but did not take it on the day of her most recent episode. She feels mentally and physically affected, with increased stress eating and weight gain from 143 lb to the 150s, which she finds distressing.  She knows she has vitamin D  deficiency and has not picked up her prescription due to transportation problems. She is worried her diet may worsen her health and energy and wants dietary guidance.  She uses diclofenac  three to four times a week as needed and Ajovy  monthly for migraines. Migraines are improved compared with the past, and she sometimes uses diclofenac  preventively.  After the recent chest pain episode she missed work for two days and has been preoccupied with the incident, which has worsened her anxiety and mood.       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   S/P cesarean section 07/20/2016   History of miscarriage 07/20/2013    Allergies:  Allergies  Allergen Reactions   Pollen Extract Itching   Medications:  Current Outpatient Medications:    busPIRone  (BUSPAR ) 15 MG tablet, Take 1 tablet (15 mg total) by mouth 3 (three) times daily as needed., Disp: 30 tablet, Rfl: 1   diclofenac  (CATAFLAM ) 50 MG tablet, Take 1 tablet (50 mg total) by mouth 3 (three) times daily as needed (for migraine headache)., Disp: 30 tablet, Rfl: 11   Fremanezumab -vfrm (AJOVY ) 225 MG/1.5ML SOSY, Inject 225 mg into the skin every 30 (thirty) days., Disp: 1.5 mL, Rfl: 11   frovatriptan  (FROVA ) 2.5 MG tablet, Take 1 tablet (2.5 mg total) by mouth as needed for migraine. If recurs, may repeat after 2 hours. Max of 2 tabs in 24 hours., Disp: 10 tablet, Rfl: 11   levocetirizine (XYZAL ) 5 MG tablet, Take 1 tablet (5 mg total) by mouth every evening., Disp: 90 tablet, Rfl: 3   norethindrone  (AYGESTIN ) 5 MG tablet, TAKE 1 TABLET (5 MG TOTAL) BY MOUTH DAILY., Disp: 90 tablet, Rfl: 0   Vitamin D , Ergocalciferol , (DRISDOL ) 1.25 MG (50000 UNIT) CAPS capsule, Take 1 capsule (50,000 Units total) by mouth every 7 (seven) days., Disp: 12 capsule, Rfl: 0  Observations/Objective: Patient is well-developed, well-nourished in no acute distress.  Resting comfortably  at home.  Head is normocephalic, atraumatic.  No labored breathing.  Speech is clear and coherent with logical content.  Patient is alert and oriented at baseline.   Assessment and Plan    Chest pain Intermittent chest pain associated with stress and anxiety. Normal EKG and no cardiac findings. Differential includes stress-related chest pain versus cardiac etiology. - Referred to cardiology for further evaluation. -  Continue buspirone  as needed for situational anxiety.  Generalized anxiety disorder; Depression, major, single episode, mild  Exacerbated by recent stressors. Discussed starting Lexapro  for anxiety and depression. Explained potential side effects and emphasized trial and error in medication. - Started Lexapro  10 mg daily. - Continue buspirone  as needed. - Reassess in 4 weeks for Lexapro  effectiveness.  Weight gain; overweight  Weight gain due to stress-related eating. Discussed dietary changes and need for nutritional guidance. - Referred to a nutritionist for dietary guidance and weight management.  Vitamin D  deficiency Persistent deficiency despite supplementation. Discussed dietary sources and importance of supplementation. - Pick up and start vitamin D  supplementation. - Referred to a nutritionist for dietary guidance on vitamin D  intake.   Follow Up Instructions: I discussed the assessment and treatment plan with the patient. The patient was provided an opportunity to ask questions and all were answered. The patient agreed with the plan and demonstrated an understanding of the instructions.  A copy of instructions were sent to the patient via MyChart unless otherwise noted below.   The patient was advised to call back or seek an in-person evaluation if the symptoms worsen or if the condition fails to improve as anticipated.  Lucie Buttner, [place]

## 2024-09-09 NOTE — Telephone Encounter (Signed)
 Please review and advise.

## 2024-09-13 ENCOUNTER — Encounter: Payer: Self-pay | Admitting: Physician Assistant

## 2024-09-16 ENCOUNTER — Ambulatory Visit: Admitting: Licensed Clinical Social Worker

## 2024-09-16 ENCOUNTER — Encounter: Payer: Self-pay | Admitting: Obstetrics and Gynecology

## 2024-09-20 ENCOUNTER — Other Ambulatory Visit: Payer: Self-pay | Admitting: *Deleted

## 2024-09-20 ENCOUNTER — Telehealth: Payer: Self-pay | Admitting: *Deleted

## 2024-09-20 ENCOUNTER — Other Ambulatory Visit: Payer: Self-pay | Admitting: Obstetrics and Gynecology

## 2024-09-20 MED ORDER — NORETHINDRONE ACETATE 5 MG PO TABS: 5.0000 mg | ORAL_TABLET | Freq: Every day | ORAL | 1 refills | Status: DC

## 2024-09-20 NOTE — Telephone Encounter (Signed)
 See telephone encounter dated 09/20/24.   Will close this encounter.

## 2024-09-20 NOTE — Telephone Encounter (Signed)
 Med refill request:norethindrone  5 mg tab PO daily   Spoke with patient, confirmed pharmacy on file. Advised to keep AEX for future refills.   Last AEX:10/19/23 -EB Next AEX:11/05/24 -EB Last MMG (if hormonal med) None on file.   Rx approved per [facility] Medication RF protocol.  #30/1RF  Routing FYI.   Encounter closed.

## 2024-09-23 ENCOUNTER — Ambulatory Visit: Admitting: Licensed Clinical Social Worker

## 2024-09-23 DIAGNOSIS — F331 Major depressive disorder, recurrent, moderate: Secondary | ICD-10-CM

## 2024-09-23 DIAGNOSIS — F411 Generalized anxiety disorder: Secondary | ICD-10-CM | POA: Diagnosis not present

## 2024-09-23 NOTE — Progress Notes (Signed)
 [facility] Counselor/Therapist Progress Note  Patient ID: [name] [name], MRN: [id number]    Date: 09/23/2024  Time Spent: 10:00  am - 10:55 am : 55 Minutes  Treatment Type: Individual Therapy.  Reported Symptoms: Patient appeared more hopeful and engaged. Affect brighter than previous sessions. She acknowledged incomplete homework (educational videos) but reported completing one guided meditation using winter solstice sunlight, engaging in movement, and daily soothing practices (ginger tea).  Mental Status Exam: Appearance:  Casual     Behavior: Appropriate and Sharing  Motor: Normal  Speech/Language:  Normal Rate  Affect: Appropriate  Mood: normal  Thought process: normal  Thought content:   WNL  Sensory/Perceptual disturbances:   WNL  Orientation: oriented to person, place, and time/date  Attention: Good  Concentration: Good  Memory: WNL  Fund of knowledge:  Good  Insight:   Good  Judgment:  Good  Impulse Control: Good   Risk Assessment: Danger to Self:  No Self-injurious Behavior: No Danger to Others: No Duty to Warn:no Physical Aggression / Violence:No  Access to Firearms a concern: No  Gang Involvement:No   Subjective:   [name] [name] participated from home, via video, and consented to treatment. I discussed the limitations of evaluation and management by telemedicine and the availability of in person appointments. The patient expressed understanding and agreed to proceed.  Therapist participated from home office.  [name] [name] reviewed the events of the past week. Patient reported taking prior session discussions seriously and consulting with her PCP, resulting in initiation of medication for depression. She reports noticing some improvement already and described increased intimacy and communication with her husband, while acknowledging their recovery is ongoing. Patient emphasized a desire to honor self-care.     Interventions: Mindfulness  Meditation and Solution-Oriented/Positive Psychology  Diagnosis:  Generalized anxiety disorder  Psychiatric Treatment: No , N/A  Treatment Plan:  Client Abilities/Strengths [name] [name] is open to sessions.    Support System: Family and Friends  Client Treatment Preferences Hybrid  Client Statement of Needs [name] [name] would like to make healthy choices about her relationship-marriage an parenting.    Treatment Level Weekly  Symptoms  Positive response to meds   (Status: improved) Self care consistency-in progress   (Status: improved)  Goals:   [name] [name] Early positive response to medication noted. Progress toward emotional regulation, self-care consistency, and relational connection. Continued need for structure, follow-through, and reinforcement of self-care behaviors. Focus remains on self-care maintenance for an additional week to support consistency and accountability. Patient encouraged to review remaining videos. Continued goals include daily mindfulness, movement (minimum 10-minute walk), and grounding practices. Next session ([facility]) will focus on setting a new goal and ongoing maintenance and support.   Target Date: 09/23/24 Frequency: Weekly  Progress: 0 Modality: individual    Therapist will provide referrals for additional resources as appropriate.  Therapist will provide psycho-education regarding [name] [name]'s diagnosis and corresponding treatment approaches and interventions. Licensed Clinical Mental Health Counselor, Tawni Louder, [facility] will support the patient's ability to achieve the goals identified. will employ CBT, BA, Problem-solving, Solution Focused, Mindfulness,  coping skills, & other evidenced-based practices will be used to promote progress towards healthy functioning to help manage decrease symptoms associated with her diagnosis.   Reduce overall level, frequency, and intensity of the feelings of depression, anxiety and panic evidenced by decreased self  abandonment  from 6 to 7 days/week to 0 to 1 days/week per client report for at least 3 consecutive months. Verbally express understanding of the  relationship between feelings of depression, anxiety and their impact on thinking patterns and behaviors. Verbalize an understanding of the role that distorted thinking plays in creating fears, excessive worry, and ruminations.  [name] participated in the creation of the treatment plan)   Edric Fetterman, LCMHC

## 2024-10-01 ENCOUNTER — Other Ambulatory Visit: Payer: Self-pay | Admitting: Physician Assistant

## 2024-10-02 ENCOUNTER — Ambulatory Visit: Admitting: Licensed Clinical Social Worker

## 2024-10-02 DIAGNOSIS — F411 Generalized anxiety disorder: Secondary | ICD-10-CM | POA: Diagnosis not present

## 2024-10-02 DIAGNOSIS — F321 Major depressive disorder, single episode, moderate: Secondary | ICD-10-CM

## 2024-10-02 DIAGNOSIS — F331 Major depressive disorder, recurrent, moderate: Secondary | ICD-10-CM

## 2024-10-02 NOTE — Progress Notes (Signed)
 [facility] Counselor/Therapist Progress Note  Patient ID: [name] [name], MRN: [id number]    Date: 10/02/2024  Time Spent: 3:02  pm - 3:57 pm : 55 Minutes  Treatment Type: Individual Therapy.  Reported Symptoms: Patient engaged and reflective. Affect brighter than prior sessions, though anxiety noted when discussing stepsons return and marital communication. No acute distress observed.  Mental Status Exam: Appearance:  Casual     Behavior: Appropriate  Motor: Normal  Speech/Language:  Normal Rate  Affect: Appropriate  Mood: normal  Thought process: normal  Thought content:   WNL  Sensory/Perceptual disturbances:   WNL  Orientation: oriented to person, place, and time/date  Attention: Good  Concentration: Good  Memory: WNL  Fund of knowledge:  Good  Insight:   Good  Judgment:  Good  Impulse Control: Good   Risk Assessment: Danger to Self:  No Self-injurious Behavior: No Danger to Others: No Duty to Warn:no Physical Aggression / Violence:No  Access to Firearms a concern: No  Gang Involvement:No   Subjective:   [name] [name] participated from home, via video, and consented to treatment. I discussed the limitations of evaluation and management by telemedicine and the availability of in person appointments. The patient expressed understanding and agreed to proceed.  Therapist participated from home office.  [name] [name] reviewed the events of the past week. Patient reports improvement since last session, noting reduced stress and weight while stepson has been with his mother. States dread and anticipatory anxiety about his return and concerns that nothing will change. Acknowledges avoiding a needed conversation with her husband, recognizing this contributes to ongoing stress and stagnation with goals.     Interventions: Solution-Oriented/Positive Psychology and Insight-Oriented  Diagnosis:  GAD Moderate depression  Psychiatric Treatment: No ,  N/A  Treatment Plan:  Client Abilities/Strengths Cheryal Salas is open to sessions.    Support System: Family and Friends  Client Treatment Preferences Hybrid  Client Statement of Needs [name] [name] would like to make healthy choices about her relationship-marriage an parenting.     Treatment Level Weekly  Symptoms  Anticipatory anxiety   (Status: declined) Avoidance of difficult conversations contributing to ongoing stress and goal stagnation   (Status: declined)  Goals:   [name] [name] experiences symptoms of Symptoms improving with reduced environmental stressors; anticipatory anxiety persists related to family dynamics and avoidance of communication. Goal progress has stalled, but insight and motivation for reflection are present. Utilize end-of-year momentum for reflection rather than closing goals. Patient to review progress made and identify actions to address before next session after the New Year. Plan to discuss goal review at next session. No goal reduction or change in service level at this time due to ongoing need.   Target Date: 10/08/24 Frequency: Weekly  Progress: 0 Modality: individual    Therapist will provide referrals for additional resources as appropriate.  Therapist will provide psycho-education regarding [name] [name]'s diagnosis and corresponding treatment approaches and interventions. Licensed Clinical Mental Health Counselor, Tawni Louder, [facility] will support the patient's ability to achieve the goals identified. will employ CBT, BA, Problem-solving, Solution Focused, Mindfulness,  coping skills, & other evidenced-based practices will be used to promote progress towards healthy functioning to help manage decrease symptoms associated with her diagnosis.   Reduce overall level, frequency, and intensity of the feelings of depression, anxiety and panic evidenced by decreased self abandonment   from 6 to 7 days/week to 0 to 1 days/week per client report for at least 3  consecutive months. Verbally express  understanding of the relationship between feelings of depression, anxiety and their impact on thinking patterns and behaviors. Verbalize an understanding of the role that distorted thinking plays in creating fears, excessive worry, and ruminations.  [name] [name] participated in the creation of the treatment plan)   Iyari Hagner, LCMHC

## 2024-10-08 ENCOUNTER — Ambulatory Visit: Admitting: Licensed Clinical Social Worker

## 2024-10-15 ENCOUNTER — Encounter: Payer: Self-pay | Admitting: Licensed Clinical Social Worker

## 2024-10-15 ENCOUNTER — Ambulatory Visit: Admitting: Licensed Clinical Social Worker

## 2024-10-15 DIAGNOSIS — F411 Generalized anxiety disorder: Secondary | ICD-10-CM

## 2024-10-15 DIAGNOSIS — F331 Major depressive disorder, recurrent, moderate: Secondary | ICD-10-CM

## 2024-10-15 NOTE — Progress Notes (Signed)
 [facility] Counselor/Therapist Progress Note  Patient ID: [name] [name], MRN: [id number]    Date: 10/15/2024  Time Spent: 9:02  am - 9:58 am : 56 Minutes  Treatment Type: Individual Therapy.  Reported Symptoms: Patient was alert, oriented 4, cooperative, and engaged in session. Affect congruent with mood. Thought process logical and goal-directed. No SI/HI reported. Patient demonstrated insight into barriers impacting progress and willingness to revise goals and implement supports.  Mental Status Exam: Appearance:  Well Groomed     Behavior: Appropriate  Motor: Normal  Speech/Language:  Normal Rate  Affect: Appropriate  Mood: normal  Thought process: normal  Thought content:   WNL  Sensory/Perceptual disturbances:   WNL  Orientation: oriented to person, place, and time/date  Attention: Good  Concentration: Good  Memory: WNL  Fund of knowledge:  Good  Insight:   Good  Judgment:  Good  Impulse Control: Good   Risk Assessment: Danger to Self:  No Self-injurious Behavior: No Danger to Others: No Duty to Warn:no Physical Aggression / Violence:No  Access to Firearms a concern: No  Gang Involvement:No   Subjective:   [name] [name] participated in the session, in person in the office with the therapist, and consented to treatment. [name] [name] reviewed the events of the past week. Patient presented for goal review. She acknowledged difficulty following through with her current treatment goals and reported low motivation and inconsistency with self-care practices. Patient shared she is adjusting to Lexapro  and experiencing disrupted sleep due to environmental factors, including her husband sleeping with the television on. She expressed insight into the importance of sleep for her mental health and emotional regulation.     Interventions: Insight-Oriented  Diagnosis:   GAD  Psychiatric Treatment: No , N/A  Treatment Plan:  Client Abilities/Strengths [name] [name] is open to sessions.    Support System: Family and Friends   Client Treatment Preferences Hybrid  Client Statement of Needs [name] [name] would like to make healthy choices about her relationship-marriage an parenting.     Treatment Level Weekly  Symptoms  Low motivation with inconsistent follow-through on self-care, treatment goals, and daily responsibilities.   (Status: maintained) Sleep disturbance, including difficulty maintaining restful sleep due to environmental factors, contributing to emotional dysregulation and increased stress.   (Status: declined)  Goals:   [name] [name]  Patient is making gradual progress toward treatment goals but currently struggling with follow-through and motivation. Adjustment to medication and poor sleep hygiene are contributing factors impacting emotional well-being. Continued focus on boundary-setting, sleep hygiene, and external supports is clinically indicated.Treatment goals will be extended and revised to emphasize added support during Lexapro  adjustment, improved sleep hygiene, and reinforcement of healthy boundaries. Homework includes transitioning Lexapro  to morning dosing with protein, tracking emotional responses throughout the day, and initiating a conversation with her husband regarding nighttime television use and its impact on her sleep and mental health. Continued focus will remain on strengthening positive self-talk, consistent self-care routines, healthier boundaries, and prioritization of self-growth and financial stability. New Goal: Over the next 8-10 weeks, the patient will increase emotional stability and overall functioning by consistently implementing medication adherence, healthy sleep hygiene practices, and effective boundary-setting. She will improve motivation and positive self-talk, engage in regular self-care routines, and utilize identified supports while adjusting to Lexapro . Progress will be demonstrated by improved sleep quality,  increased follow-through with daily responsibilities (including financial management), enhanced communication of needs within relationships, and self-reported improvement in emotional well-being and sense of personal control.  Target Date: 12/24/24 Frequency: Weekly  Progress: 0 Modality: individual    Therapist will provide referrals for additional resources as appropriate.  Therapist will provide psycho-education regarding [name] [name]'s diagnosis and corresponding treatment approaches and interventions. Licensed Clinical Mental Health Counselor, Tawni Louder, [facility] will support the patient's ability to achieve the goals identified. will employ CBT, BA, Problem-solving, Solution Focused, Mindfulness,  coping skills, & other evidenced-based practices will be used to promote progress towards healthy functioning to help manage decrease symptoms associated with her diagnosis.   Reduce overall level, frequency, and intensity of the feelings of depression, anxiety and panic evidenced by decreased  self abandonment  from 6 to 7 days/week to 0 to 1 days/week per client report for at least 3 consecutive months. Verbally express understanding of the relationship between feelings of depression, anxiety and their impact on thinking patterns and behaviors. Verbalize an understanding of the role that distorted thinking plays in creating fears, excessive worry, and ruminations.  [name] [name] participated in the creation of the treatment plan)   Lian Pounds, [facility] Behavioral Health Treatment Plan   Name:[name] [name]    MRN: [id number]   Treatment Plan Development Date: 10/15/24   Strengths: Supportive Relationships  Supports: Spouse and Friends   Theatre Manager of Needs: make healthy choices about her relationship-marriage an parenting.      Treatment Level:Weekly  Client Treatment Preferences:Hybrid   Diagnosis: Anxiety  GAD Moderate depression  Symptoms:  Being easily  fatigued and Sleep disturbance (Difficulty falling or staying asleep, restlessness, or unsatisfying sleep  Goals:  Resolve issues and concerns that are resulting in anxiety. and Build and employ tools and skills to reduce symptoms of anxiety, worry, and improve functioning day-to-day.  Objectives: Target Date For All Objectives: 12/24/24  Develop coping tools to manage anxiety including mindfulness, acceptance, relaxation, reframing, and challenging negative thoughts and feelings.  Progress Documentation:  Stagnating  Interventions:  Psychoeducation regarding diagnoses and treatment, Assertiveness, and Self-Care - exercise, sleep, nutrition   Expected duration of treatment: 8-10 weeks of Solution focused brief therapy  Party responsible for implementation of interventions: patient and provider.   This plan has been reviewed and created by the following participants: patient and provider   A new plan will be created at least every 12 months.  The patient fully participated in the development of treatment plan with the clinician and verbally consents to such treatment.   Patient Treatment Plan Signature Obtained: No, pending signature via MyChart.   Tawni Louder, LCMHC

## 2024-10-17 ENCOUNTER — Other Ambulatory Visit: Payer: Self-pay | Admitting: Obstetrics and Gynecology

## 2024-10-17 NOTE — Telephone Encounter (Signed)
 Med refill request:Aygestin   Last AEX: 10/19/23  Next AEX: 11/05/24 Last MMG (if hormonal med) Refill authorized: Requesting 90 day. Last rx 09/20/24 #30 with 1 refill. Please Advise?

## 2024-10-22 ENCOUNTER — Ambulatory Visit: Admitting: Licensed Clinical Social Worker

## 2024-10-22 DIAGNOSIS — F411 Generalized anxiety disorder: Secondary | ICD-10-CM

## 2024-10-22 DIAGNOSIS — F331 Major depressive disorder, recurrent, moderate: Secondary | ICD-10-CM | POA: Diagnosis not present

## 2024-10-22 NOTE — Progress Notes (Signed)
 [facility] Counselor/Therapist Progress Note  Patient ID: [name] [name], MRN: [id number]    Date: 10/22/24  Time Spent: 10:01  am - 10:59 am : 58 Minutes  Treatment Type: Individual Therapy.  Reported Symptoms: Patient alert and oriented x4. Affect constricted, mood anxious and discouraged. Insight improving; judgment fair. No SI/HI. Ambulation affected due to ankle injury. Relational stressors prominent. Engagement in session appropriate.  Mental Status Exam: Appearance:  Casual     Behavior: Appropriate, Sharing, and Rationalizing  Motor: Normal  Speech/Language:  Normal Rate  Affect: Appropriate  Mood: normal  Thought process: circumstantial  Thought content:   WNL  Sensory/Perceptual disturbances:   WNL  Orientation: oriented to person, place, and time/date  Attention: Good  Concentration: Good  Memory: WNL  Fund of knowledge:  Good  Insight:   Good  Judgment:  Good  Impulse Control: Good   Risk Assessment: Danger to Self:  No Self-injurious Behavior: No Danger to Others: No Duty to Warn:no Physical Aggression / Violence:No  Access to Firearms a concern: No  Gang Involvement:No   Subjective:   [name] [name] participated from home, via video, and consented to treatment. I discussed the limitations of evaluation and management by telemedicine and the availability of in person appointments. The patient expressed understanding and agreed to proceed.  Therapist participated from office.  [name] [name] reviewed the events of the past week. Patient reports feeling stagnant in personal growth due to ongoing relational dynamics with husband. Endorses increased self-awareness but intense fear of asking for her needs to be met. States belief that being in her masculine/alpha energy leads to disappointment and reinforces avoidance (why ask if I wont get it anyway). Reports husband threatening separation, stating she is stuck in the past related to prior  relational hurt. Patient perceives husband as not taking initiative to be emotionally or practically supportive. Recently sprained ankle from falling down stairs; reports difficulty asking for help and increased emotional distress related to vulnerability.     Interventions: Insight-Oriented  Diagnosis:  GAD MDD-Moderate  Psychiatric Treatment: No , N/A  Treatment Plan:  Client Abilities/Strengths Mitchelle Sultan is open to sessions.    Support System: Family and Friends   Client Treatment Preferences Hybrid  Client Statement of Needs [name] [name] would like to make healthy choices about her relationship-marriage an parenting.      Treatment Level Weekly  Symptoms  Avoidance of assertive communication   (Status: declined) Anxiety related to relational conflict and vulnerability   (Status: declined)  Goals:   [name] [name] Relational distress and avoidance patterns contributing to emotional stagnation. Core issues include fear of vulnerability, learned helplessness, and maladaptive beliefs around self-advocacy and power dynamics. Progress noted in insight and self-awareness; limited behavioral change due to avoidance of discomfort and fear of rejection. Marital conflict and lack of perceived support exacerbate symptoms. Continue psychotherapy focused on relational patterns, self-advocacy, and tolerating discomfort. No formal homework assigned. Patient encouraged to reflect on ways avoidance of confronting discomfort with husband reinforces stagnation. Reassess readiness for behavioral experimentation in future sessions.   Target Date: 12/24/24 Frequency: weekly  Progress: 0 Modality: individual    Therapist will provide referrals for additional resources as appropriate.  Therapist will provide psycho-education regarding [name] [name]'s diagnosis and corresponding treatment approaches and interventions. Licensed Clinical Mental Health Counselor, Tawni Louder, [facility] will support the  patient's ability to achieve the goals identified. will employ CBT, BA, Problem-solving, Solution Focused, Mindfulness,  coping skills, & other evidenced-based practices will be  used to promote progress towards healthy functioning to help manage decrease symptoms associated with her diagnosis.   Reduce overall level, frequency, and intensity of the feelings of depression, anxiety and panic evidenced by decreased self abandonment  from 6 to 7 days/week to 0 to 1 days/week per client report for at least 3 consecutive months. Verbally express understanding of the relationship between feelings of depression, anxiety and their impact on thinking patterns and behaviors. Verbalize an understanding of the role that distorted thinking plays in creating fears, excessive worry, and ruminations.  [name] [name] participated in the creation of the treatment plan)   Alistair Senft, LCMHC

## 2024-10-29 ENCOUNTER — Ambulatory Visit: Admitting: Licensed Clinical Social Worker

## 2024-10-29 DIAGNOSIS — F411 Generalized anxiety disorder: Secondary | ICD-10-CM | POA: Diagnosis not present

## 2024-10-29 DIAGNOSIS — F331 Major depressive disorder, recurrent, moderate: Secondary | ICD-10-CM | POA: Diagnosis not present

## 2024-10-29 NOTE — Progress Notes (Signed)
 " [facility] Counselor/Therapist Progress Note  Patient ID: [name] [name], MRN: [id number]    Date: 10/29/24  Time Spent: 3:00  pm - 3:58 pm : 58 Minutes  Treatment Type: Individual Therapy.  Reported Symptoms: Patient engaged and reflective. Affect insightful and emotionally responsive. Demonstrated increased awareness when connections between childhood beliefs, trauma responses, and current marital dynamics were explored.  Mental Status Exam: Appearance:  Casual     Behavior: Appropriate, Sharing, and Rationalizing  Motor: Normal  Speech/Language:  Normal Rate  Affect: Appropriate  Mood: normal  Thought process: normal  Thought content:   WNL  Sensory/Perceptual disturbances:   WNL  Orientation: oriented to person, place, and time/date  Attention: Good  Concentration: Good  Memory: WNL  Fund of knowledge:  Good  Insight:   Good  Judgment:  Good  Impulse Control: Good   Risk Assessment: Danger to Self:  No Self-injurious Behavior: No Danger to Others: No Duty to Warn:no Physical Aggression / Violence:No  Access to Firearms a concern: No  Gang Involvement:No   Subjective:   [name] [name] participated from home, via video, and consented to treatment. I discussed the limitations of evaluation and management by telemedicine and the availability of in person appointments. The patient expressed understanding and agreed to proceed.  Therapist participated from home office.  [name] [name] reviewed the events of the past week. Patient reports difficulty completing offline assignment of asking for help. Noted tendency to ask daughter for assistance rather than husband or stepson, expressing fear of being a burden even for minor needs (e.g., asking for ice cream). As a result, patient attempted to manage independently despite a sprained ankle, leading to physical strain. Patient identified belief that men are unreliable or unhelpful, recognizing this as internalized  messaging from her mother rooted in the mothers own trauma and negative experiences with men.     Interventions: Assertiveness/Communication and Insight-Oriented  Diagnosis:  Generalized anxiety disorder  Major depressive disorder, recurrent episode, moderate (HCC)  Psychiatric Treatment: No , N/A  Treatment Plan:  Client Abilities/Strengths [name] [name] is open to sessions.    Support System: Family and Friends  Client Treatment Preferences Hybrid  Client Statement of Needs [name] [name] would like to make healthy choices about her relationship-marriage an parenting.       Treatment Level Weekly  Symptoms  Difficulty asking for and accepting help   (Status: maintained) Relationship distress marked by avoidance, mistrust, and ongoing marital conflict   (Status: maintained)  Goals:   [name] [name] Maladaptive core beliefs and trauma-informed schemas related to gender roles and help-seeking, contributing to avoidance of support, marital discord, and reinforcement of unresolved relational trauma. Increased insight and readiness for perspective shift. Provided psychoeducation on ACEs, intergenerational trauma, and how childhood beliefs shape adult relationships. Utilized metaphor to reframe inherited guidance as misaligned with current marital context. Encouraged perspective shift and openness to new relational patterns. Discussed goal of couples therapy to address unresolved trauma and chronic marital conflict. Continue individual therapy; follow-up scheduled in one week.   Target Date: 12/24/24 Frequency: Weekly  Progress: 0 Modality: individual    Therapist will provide referrals for additional resources as appropriate.  Therapist will provide psycho-education regarding [name] [name]'s diagnosis and corresponding treatment approaches and interventions. Licensed Clinical Mental Health Counselor, Tawni Louder, [facility] will support the patient's ability to achieve the goals identified. will  employ CBT, BA, Problem-solving, Solution Focused, Mindfulness,  coping skills, & other evidenced-based practices will be used to promote progress towards  healthy functioning to help manage decrease symptoms associated with her diagnosis.   Reduce overall level, frequency, and intensity of the feelings of depression, anxiety and panic evidenced by decreased self abandonment from 6 to 7 days/week to 0 to 1 days/week per client report for at least 3 consecutive months. Verbally express understanding of the relationship between feelings of depression, anxiety and their impact on thinking patterns and behaviors. Verbalize an understanding of the role that distorted thinking plays in creating fears, excessive worry, and ruminations.  [name] [name] participated in the creation of the treatment plan)   Citlalli Weikel, [facility]    "

## 2024-11-05 ENCOUNTER — Encounter: Payer: Self-pay | Admitting: Licensed Clinical Social Worker

## 2024-11-05 ENCOUNTER — Ambulatory Visit: Admitting: Obstetrics and Gynecology

## 2024-11-05 ENCOUNTER — Telehealth: Payer: Self-pay | Admitting: *Deleted

## 2024-11-05 ENCOUNTER — Ambulatory Visit: Admitting: Licensed Clinical Social Worker

## 2024-11-05 DIAGNOSIS — F411 Generalized anxiety disorder: Secondary | ICD-10-CM | POA: Diagnosis not present

## 2024-11-05 NOTE — Telephone Encounter (Signed)
-----   Message from Racker[place] H sent at 11/05/2024 10:53 AM EST ----- Patient had to rs aex for today as she is stuck in her driveway. Her aex got rs but she wanted to make sure you guys will refill her prescription since her aex is in march. If someone can follow up so I can! Thank you!

## 2024-11-05 NOTE — Progress Notes (Signed)
 [facility] Counselor/Therapist Progress Note  Patient ID: [name] [name], MRN: [id number]    Date: 11/05/24  Time Spent: 12:00  pm - 12:58 pm : 58 Minutes  Treatment Type: Individual Therapy.  Reported Symptoms: Patient engaged, mildly frustrated affect, mood reflective. Thought process coherent and goal-directed. No safety concerns reported.  Mental Status Exam: Appearance:  Casual     Behavior: Appropriate and Sharing  Motor: Normal  Speech/Language:  Normal Rate  Affect: Appropriate  Mood: angry  Thought process: normal  Thought content:   WNL  Sensory/Perceptual disturbances:   WNL  Orientation: oriented to person, place, and time/date  Attention: Good  Concentration: Good  Memory: WNL  Fund of knowledge:  Good  Insight:   Good  Judgment:  Good  Impulse Control: Good   Risk Assessment: Danger to Self:  No Self-injurious Behavior: No Danger to Others: No Duty to Warn:no Physical Aggression / Violence:No  Access to Firearms a concern: No  Gang Involvement:No   Subjective:   [name] [name] participated from home, via video, and consented to treatment. I discussed the limitations of evaluation and management by telemedicine and the availability of in person appointments. The patient expressed understanding and agreed to proceed.  Therapist participated from home office.  [name] [name] reviewed the events of the past week. Patient spent session reflecting on multiple past birthday disappointments, describing them as poorly planned and driven by prioritizing others expectations. When asked what she wants for herself this year for her upcoming 39st birthday, patient reported uncertainty and difficulty identifying personal desires, citing a sense of obligation to appease others.   Interventions: Insight-Oriented  Diagnosis:  Generalized anxiety disorder  Psychiatric Treatment: No , N/A  Treatment Plan:  Client Abilities/Strengths [name] [name] is open to  sessions.    Support System: Family and Friends   Client Treatment Preferences Hybrid  Client Statement of Needs [name] [name] would like to make healthy choices about her relationship-marriage an parenting.        Treatment Level Weekly  Symptoms  People-pleasing with prioritization of others over self   (Status: maintained) Difficulty identifying personal wants and needs   (Status: maintained)  Goals:   [name] [name] Difficulty with self-prioritization and boundary-setting contributing to dissatisfaction and unmet emotional needs. Patterns of people-pleasing and reduced self-attunement noted. Explored values, self-compassion, and intentional choice-making for upcoming birthday. Encouraged patient to identify and articulate personal wants separate from others expectations. Continue therapy to address boundaries and self-directed needs. Follow-up as scheduled.   Target Date: 12/24/24 Frequency: Weekly  Progress: 0 Modality: individual    Therapist will provide referrals for additional resources as appropriate.  Therapist will provide psycho-education regarding [name] [name]'s diagnosis and corresponding treatment approaches and interventions. Licensed Clinical Mental Health Counselor, Tawni Louder, [facility] will support the patient's ability to achieve the goals identified. will employ CBT, BA, Problem-solving, Solution Focused, Mindfulness,  coping skills, & other evidenced-based practices will be used to promote progress towards healthy functioning to help manage decrease symptoms associated with her diagnosis.   Reduce overall level, frequency, and intensity of the feelings of depression, anxiety and panic evidenced by decreased self abandonment from 6 to 7 days/week to 0 to 1 days/week per client report for at least 3 consecutive months. Verbally express understanding of the relationship between feelings of depression, anxiety and their impact on thinking patterns and behaviors. Verbalize an  understanding of the role that distorted thinking plays in creating fears, excessive worry, and ruminations.  [name] [name] participated  in the creation of the treatment plan)   Margurete Guaman, LCMHC

## 2024-11-18 ENCOUNTER — Ambulatory Visit: Admitting: Licensed Clinical Social Worker

## 2024-12-11 ENCOUNTER — Ambulatory Visit: Admitting: Obstetrics and Gynecology

## 2024-12-25 ENCOUNTER — Telehealth: Admitting: Neurology

## 2025-03-04 ENCOUNTER — Ambulatory Visit: Admitting: Physician Assistant
